# Patient Record
Sex: Male | Born: 1970
Health system: Southern US, Community
[De-identification: ages and names within clinical notes are randomized; demographics above are authoritative.]

## PROBLEM LIST (undated history)

## (undated) DIAGNOSIS — M1990 Unspecified osteoarthritis, unspecified site: Secondary | ICD-10-CM

## (undated) DIAGNOSIS — N319 Neuromuscular dysfunction of bladder, unspecified: Secondary | ICD-10-CM

## (undated) DIAGNOSIS — I10 Essential (primary) hypertension: Secondary | ICD-10-CM

## (undated) DIAGNOSIS — K592 Neurogenic bowel, not elsewhere classified: Secondary | ICD-10-CM

## (undated) DIAGNOSIS — G709 Myoneural disorder, unspecified: Secondary | ICD-10-CM

## (undated) HISTORY — DX: Neuromuscular dysfunction of bladder, unspecified: N31.9

## (undated) HISTORY — PX: WISDOM TOOTH EXTRACTION: SHX21

## (undated) HISTORY — PX: TONSILLECTOMY: SUR1361

## (undated) HISTORY — DX: Neurogenic bowel, not elsewhere classified: K59.2

---

## 2014-10-05 ENCOUNTER — Ambulatory Visit (INDEPENDENT_AMBULATORY_CARE_PROVIDER_SITE_OTHER): Payer: BLUE CROSS/BLUE SHIELD | Admitting: Family Medicine

## 2014-10-05 ENCOUNTER — Encounter: Payer: Self-pay | Admitting: Family Medicine

## 2014-10-05 VITALS — BP 174/110 | HR 76 | Ht 73.0 in | Wt 279.1 lb

## 2014-10-05 DIAGNOSIS — I10 Essential (primary) hypertension: Secondary | ICD-10-CM

## 2014-10-05 DIAGNOSIS — E669 Obesity, unspecified: Secondary | ICD-10-CM

## 2014-10-05 DIAGNOSIS — L918 Other hypertrophic disorders of the skin: Secondary | ICD-10-CM | POA: Diagnosis not present

## 2014-10-05 DIAGNOSIS — M5116 Intervertebral disc disorders with radiculopathy, lumbar region: Secondary | ICD-10-CM | POA: Diagnosis not present

## 2014-10-05 HISTORY — PX: OTHER SURGICAL HISTORY: SHX169

## 2014-10-05 MED ORDER — LISINOPRIL 10 MG PO TABS: 10.0000 mg | ORAL_TABLET | Freq: Every day | ORAL | Status: DC

## 2014-10-06 ENCOUNTER — Ambulatory Visit: Payer: Self-pay | Admitting: Family Medicine

## 2014-10-06 ENCOUNTER — Encounter: Payer: Self-pay | Admitting: Family Medicine

## 2014-10-06 LAB — COMPREHENSIVE METABOLIC PANEL
ALT: 27 IU/L (ref 0–44)
AST: 30 IU/L (ref 0–40)
Albumin/Globulin Ratio: 1.9 (ref 1.1–2.5)
Albumin: 4.4 g/dL (ref 3.5–5.5)
Alkaline Phosphatase: 46 IU/L (ref 39–117)
BUN/Creatinine Ratio: 13 (ref 9–20)
BUN: 13 mg/dL (ref 6–24)
Bilirubin Total: 0.7 mg/dL (ref 0.0–1.2)
CO2: 28 mmol/L (ref 18–29)
Calcium: 9.7 mg/dL (ref 8.7–10.2)
Chloride: 100 mmol/L (ref 97–108)
Creatinine, Ser: 0.98 mg/dL (ref 0.76–1.27)
GFR calc Af Amer: 109 mL/min/{1.73_m2} (ref >59)
GFR calc non Af Amer: 95 mL/min/{1.73_m2} (ref >59)
Globulin, Total: 2.3 g/dL (ref 1.5–4.5)
Glucose: 98 mg/dL (ref 65–99)
Potassium: 4 mmol/L (ref 3.5–5.2)
Sodium: 143 mmol/L (ref 134–144)
Total Protein: 6.7 g/dL (ref 6.0–8.5)

## 2014-10-06 LAB — CBC
Hematocrit: 41.2 % (ref 37.5–51.0)
Hemoglobin: 13.8 g/dL (ref 12.6–17.7)
MCH: 30.2 pg (ref 26.6–33.0)
MCHC: 33.5 g/dL (ref 31.5–35.7)
MCV: 90 fL (ref 79–97)
Platelets: 230 10*3/uL (ref 150–379)
RBC: 4.57 x10E6/uL (ref 4.14–5.80)
RDW: 13.7 % (ref 12.3–15.4)
WBC: 6.3 10*3/uL (ref 3.4–10.8)

## 2014-10-06 LAB — LIPID PANEL
Chol/HDL Ratio: 5 ratio units (ref 0.0–5.0)
Cholesterol, Total: 174 mg/dL (ref 100–199)
HDL: 35 mg/dL — ABNORMAL LOW (ref >39)
LDL Calculated: 101 mg/dL — ABNORMAL HIGH (ref 0–99)
Triglycerides: 189 mg/dL — ABNORMAL HIGH (ref 0–149)
VLDL Cholesterol Cal: 38 mg/dL (ref 5–40)

## 2014-10-06 LAB — TSH: TSH: 1.04 u[IU]/mL (ref 0.450–4.500)

## 2014-10-06 NOTE — Progress Notes (Signed)
Date:  10/05/2014   Name:  Andrew [name]   DOB:  [birth year]   MRN:  [id number]  PCP:  No primary care provider on file.    Chief Complaint: Hypertension and Back Pain   History of Present Illness:  This is a 44 y.o. male with LBP for 12 yrs, bulging discs at L3/4 and 4/5 seen on MRI 2006, currently with R leg pain thigh to ankle, worse past 6 months, worse with standing but sitting ok, uses exercise ball, PT helped in past, asks about seeing Dr. Lovell Sheehan at [facility]. Off lisinopril since May, BP well controlled on med previously. Has skin tag R inner elbow wants removed. Tetanus 2008. Weight up 30# past 3 months due to decreased exercise. Hx high chol on Crestor in past.  Review of Systems:  Review of Systems  Constitutional: Positive for activity change. Negative for fever, appetite change and fatigue.  HENT: Negative for ear pain and sore throat.   Eyes: Negative for pain.  Respiratory: Negative for shortness of breath.   Cardiovascular: Negative for chest pain and leg swelling.  Gastrointestinal: Negative for abdominal pain and rectal pain.  Endocrine: Negative for polyuria.  Genitourinary: Negative for flank pain and penile pain.  Musculoskeletal: Negative for back pain.  Skin: Negative for rash.  Neurological: Negative for syncope and headaches.  Hematological: Negative for adenopathy.  Psychiatric/Behavioral: Negative for confusion. The patient is not nervous/anxious.     Patient Active Problem List   Diagnosis Date Noted  . Hypertension 10/05/2014  . Obesity (BMI 30-39.9) 10/05/2014  . Lumbar disc disease with radiculopathy 10/05/2014    Prior to Admission medications   Medication Sig Start Date End Date Taking? Authorizing Provider  amoxicillin (AMOXIL) 500 MG capsule Take 1 capsule by mouth 3 (three) times daily. 10/02/14  Yes Historical Provider, MD  lisinopril (PRINIVIL,ZESTRIL) 10 MG tablet Take 1 tablet (10 mg total) by mouth daily. 10/05/14   Schuyler Amor, MD    No  Known Allergies  Past Surgical History  Procedure Laterality Date  . None  [id number]    History  Substance Use Topics  . Smoking status: Former Smoker    Quit date: 04/28/2002  . Smokeless tobacco: Not on file  . Alcohol Use: 0.0 oz/week    0 Standard drinks or equivalent per week    Family History  Problem Relation Age of Onset  . Hypertension Mother   . Cancer Brother     nonhodgkin lymphoma    Medication list has been reviewed and updated.  Physical Examination: BP 174/110 mmHg  Pulse 76  Ht  (1.854 m)  Wt 279 lb 1.6 oz (126.599 kg)  BMI 36.83 kg/m2  Physical Exam  Constitutional: He is oriented to person, place, and time. He appears well-developed and well-nourished.  HENT:  Head: Normocephalic and atraumatic.  Right Ear: External ear normal.  Left Ear: External ear normal.  Mouth/Throat: Oropharynx is clear and moist.  Eyes: Conjunctivae and EOM are normal. Pupils are equal, round, and reactive to light. Right eye exhibits no discharge. Left eye exhibits no discharge. No scleral icterus.  Neck: No thyromegaly present.  Cardiovascular: Normal rate, regular rhythm and normal heart sounds.   Pulmonary/Chest: Effort normal and breath sounds normal.  Abdominal: Soft. He exhibits no distension and no mass. There is no tenderness.  Musculoskeletal: He exhibits no edema.  Positive right SLR, some pain with bent knee flexion and hip rotation as well, gait normal  Lymphadenopathy:    He  has no cervical adenopathy.  Neurological: He is alert and oriented to person, place, and time. Coordination normal.  Skin: No rash noted.  1 cm skin tag inner aspect R elbow  Psychiatric: He has a normal mood and affect. His behavior is normal.    Assessment and Plan:  1. Essential hypertension Poor control off lisinopril, restart - Comprehensive Metabolic Panel (CMET) - CBC  2. Obesity (BMI 30-39.9) Weight loss/exercise discussed - TSH - Lipid Profile  3. Skin  tag After alcohol prep, skin tag removed with sterile scissors, minimal blood loss, bandaid applied  4. Lumbar disc disease with radiculopathy Refer PT as helped in past, consider Dr. Lovell SheehanJenkins referral if sxs worsen/persist - Ambulatory referral to Physical Therapy  Return in about 4 weeks (around 11/02/2014).  Dionne AnoWilliam M. Kingsley SpittlePlonk, Jr. MD [facility]  10/06/2014

## 2014-10-12 ENCOUNTER — Ambulatory Visit: Payer: BLUE CROSS/BLUE SHIELD | Attending: Family Medicine | Admitting: Physical Therapy

## 2014-10-12 DIAGNOSIS — M6281 Muscle weakness (generalized): Secondary | ICD-10-CM

## 2014-10-12 DIAGNOSIS — M2560 Stiffness of unspecified joint, not elsewhere classified: Secondary | ICD-10-CM | POA: Diagnosis not present

## 2014-10-12 DIAGNOSIS — M5441 Lumbago with sciatica, right side: Secondary | ICD-10-CM | POA: Insufficient documentation

## 2014-10-13 NOTE — Therapy (Signed)
[facility] [address] [place], [place], [address] Phone: [phone]   Fax:  [phone]  Physical Therapy Evaluation  Patient Details  Name: Andrew [name] MRN: [id number] Date of Birth: Jul 30, 1970 Referring Provider:  Schuyler Amor, MD  Encounter Date: 10/12/2014      PT End of Session - 10/13/14 1909    Visit Number 1   Number of Visits 8   Date for PT Re-Evaluation 11/09/14   PT Start Time 1603   PT Stop Time 1702   PT Time Calculation (min) 59 min   Activity Tolerance Patient tolerated treatment well;Patient limited by pain      No past medical history on file.  Past Surgical History  Procedure Laterality Date  . None  [id number]    There were no vitals filed for this visit.  Visit Diagnosis:  Right-sided low back pain with right-sided sciatica  Muscle weakness  Joint stiffness      Subjective Assessment - 10/13/14 1900    Subjective Pt. reports chronic LBP (R side>L) over past 12 years.  Pt. states he had an MRI in 2006 with a clinical impression of L3-4, L4-5 disc bulges.  Pt. reports persistent R LE pain which is worse in standing/ running/ increase physical activity.    Limitations Sitting;Lifting;Standing;Walking   Diagnostic tests MRI 10+ years ago   Patient Stated Goals Increase activity tolerance/ decrease pain/ return to gym based ex. program.     Currently in Pain? Yes   Pain Score 5    Pain Location Back   Pain Orientation Right;Lower            [facility] PT Assessment - 10/13/14 0001    Assessment   Medical Diagnosis Low back pain   Onset Date/Surgical Date 03/28/03   Next MD Visit 11/02/14      Objective: see HEP (handouts).  Manual tx.: Prone STM to B lumbar paraspinals with focus on R side.  Grade II-III PA mobs. (CPA/UPA)- as tolerated 2x15 sec. (R sided tenderness/ SI discomfort into piriformis).  Supine LE/lumbar generalized stretches 8 min.         PT Education - 10/12/14 1711    Education provided Yes   Person(s) Educated Patient   Methods Explanation;Demonstration;Handout   Comprehension Verbalized understanding;Returned demonstration             PT Long Term Goals - 10/13/14 1923    PT LONG TERM GOAL #1   Title Pt. I with HEP to increase core stability/ lumbar AROM to improve pain-free mobility/ return to gym based ex.    Time 4   Status New   PT LONG TERM GOAL #2   Title Pt. will decrease Oswestry to <30% to improve pain-free mobility.     Baseline 48% self-perceived severe disability   Time 4   Period Weeks   Status New   PT LONG TERM GOAL #3   Title Pt. will report decrease R LE radicular symptoms/ localized to lumbar region to improve pain-free mobility.     Baseline R LE radicular symptoms to R ankle   Time 4   Period Weeks   Status New   PT LONG TERM GOAL #4   Title Pt. able to tolerate 30 min. of gym based ex. with no increase c/o LBP or radicular symptoms.     Time 4   Period Weeks   Status New  Plan - 10/13/14 1912    Clinical Impression Statement Pt. is a 44 y/o male with >12 year h/o back pain with R LE radicular symptoms.  Pt. reports persistent back pain which is exacerbated with prolonged standing/walking/ increase physical activity.  Pt. presents with 50% limitation in standing lumbar flexion/ ext.  No benefit from repeated movement in standing.  (+) R SLR with B hamstring tightness noted.  Mild lumbar hypomobility with grade II PA mobs. with R lumbar paraspinal tenderness.  Good B hip capsule mobility (all planes).  Pt. reports increase pain with prolonged standing/walking at work and states he has a decrease in overall activity tolerance.  Pt. will benefit from skilled PT services to increase core stability/ pain-free lumbar AROM to improve return to exercise/ functional mobility.     Pt will benefit from skilled therapeutic intervention in order to improve on the following deficits Hypomobility;Obesity;Decreased  activity tolerance;Decreased strength;Pain;Difficulty walking;Decreased mobility;Decreased range of motion;Improper body mechanics;Postural dysfunction   Rehab Potential Fair   PT Frequency 2x / week   PT Duration 4 weeks   PT Treatment/Interventions Passive range of motion;Aquatic Therapy;Patient/family education;Dry needling;Moist Heat;Manual techniques;Therapeutic exercise;Electrical Stimulation;Cryotherapy;Gait training;Functional mobility training   PT Next Visit Plan review HEP/ manual tx. focus.    PT Home Exercise Plan see handouts   Recommended Other Services gym based ex.    Consulted and Agree with Plan of Care Patient         Problem List Patient Active Problem List   Diagnosis Date Noted  . Hypertension 10/05/2014  . Obesity (BMI 30-39.9) 10/05/2014  . Lumbar disc disease with radiculopathy 10/05/2014   Andrew McgeeMichael C Kristian [name], PT, DPT # [id number]   10/13/2014, 7:44 PM  [place] [facility] [address] [place], [place], [address] Phone: [phone]   Fax:  [phone]

## 2014-10-14 ENCOUNTER — Ambulatory Visit: Payer: BLUE CROSS/BLUE SHIELD | Admitting: Physical Therapy

## 2014-10-14 DIAGNOSIS — M5441 Lumbago with sciatica, right side: Secondary | ICD-10-CM

## 2014-10-14 DIAGNOSIS — M2560 Stiffness of unspecified joint, not elsewhere classified: Secondary | ICD-10-CM

## 2014-10-14 DIAGNOSIS — M6281 Muscle weakness (generalized): Secondary | ICD-10-CM

## 2014-10-15 NOTE — Therapy (Signed)
[place] [facility] [address] [place], [place], [address] Phone: [phone]   Fax:  [phone]  Physical Therapy Treatment  Patient Details  Name: Andrew [name] MRN: [id number] Date of Birth: 1970-04-19 Referring Provider:  Schuyler Amor, MD  Encounter Date: 10/14/2014      PT End of Session - 10/15/14 1809    Visit Number 2   Number of Visits 8   Date for PT Re-Evaluation 11/09/14   PT Start Time 1555   PT Stop Time 1648   PT Time Calculation (min) 53 min   Activity Tolerance Patient tolerated treatment well;Patient limited by pain   Behavior During Therapy [facility] for tasks assessed/performed      No past medical history on file.  Past Surgical History  Procedure Laterality Date  . None  [id number]    There were no vitals filed for this visit.  Visit Diagnosis:  Right-sided low back pain with right-sided sciatica  Muscle weakness  Joint stiffness      Subjective Assessment - 10/15/14 1807    Subjective Pt. reports initial soreness after PT initial evaluation but felt better the following day.  Pt. states STM/ manual tx has helped and had wife give a massage.     Limitations Sitting;Lifting;Standing;Walking   Diagnostic tests MRI 10+ years ago   Patient Stated Goals Increase activity tolerance/ decrease pain/ return to gym based ex. program.     Currently in Pain? Yes   Pain Score 4    Pain Location Back   Pain Orientation Right;Lower   Pain Type Chronic pain        OBJECTIVE:  There.ex.: Reviewed HEP.  TrA muscle contraction with instruction in supine position with B knee flexion.  Tactile/ verbal cuing for proper technique with pelvic tilts/ hip flexion/ bridging (pain tolerable range).  Manual:  Supine LE/ lumbar stretches with hamstring/ piriformis/ trunk rotn. Stretches 3x each.  Prone grade II-III PA mobs. T11-L5 2x15 sec. (as tolerated)- tenderness noted.  STM to lumbar paraspinals to increase muscle  extensibility.      Pt response for medical necessity: cuing for proper core muscle contraction with supine stabilization program.  (+) tenderness with palpation/ STM but able to tolerate mobs. To increase spinal mobility.           PT Long Term Goals - 10/13/14 1923    PT LONG TERM GOAL #1   Title Pt. I with HEP to increase core stability/ lumbar AROM to improve pain-free mobility/ return to gym based ex.    Time 4   Status New   PT LONG TERM GOAL #2   Title Pt. will decrease Oswestry to <30% to improve pain-free mobility.     Baseline 48% self-perceived severe disability   Time 4   Period Weeks   Status New   PT LONG TERM GOAL #3   Title Pt. will report decrease R LE radicular symptoms/ localized to lumbar region to improve pain-free mobility.     Baseline R LE radicular symptoms to R ankle   Time 4   Period Weeks   Status New   PT LONG TERM GOAL #4   Title Pt. able to tolerate 30 min. of gym based ex. with no increase c/o LBP or radicular symptoms.     Time 4   Period Weeks   Status New            Plan - 10/15/14 1810    Clinical Impression Statement Pt. requires  cuing to relax hip/hamstring musculature during stretches.  Several areas of point tenderness with palpation along R lumbar/ SI region during manual tx. Cuing required for core muscle contraction during ther.ex./ position changes.     Pt will benefit from skilled therapeutic intervention in order to improve on the following deficits Hypomobility;Obesity;Decreased activity tolerance;Decreased strength;Pain;Difficulty walking;Decreased mobility;Decreased range of motion;Improper body mechanics;Postural dysfunction   Rehab Potential Fair   PT Frequency 2x / week   PT Duration 4 weeks   PT Treatment/Interventions Passive range of motion;Aquatic Therapy;Patient/family education;Dry needling;Moist Heat;Manual techniques;Therapeutic exercise;Electrical Stimulation;Cryotherapy;Gait training;Functional mobility  training   PT Next Visit Plan review HEP/ manual tx. focus.    PT Home Exercise Plan see handouts   Recommended Other Services gym based ex.    Consulted and Agree with Plan of Care Patient        Problem List Patient Active Problem List   Diagnosis Date Noted  . Hypertension 10/05/2014  . Obesity (BMI 30-39.9) 10/05/2014  . Lumbar disc disease with radiculopathy 10/05/2014   Cammie Mcgee, PT, DPT # [id number]   10/15/2014, 6:21 PM  [place] [facility] [address] [place], [place], [address] Phone: [phone]   Fax:  [phone]

## 2014-10-19 ENCOUNTER — Ambulatory Visit: Payer: BLUE CROSS/BLUE SHIELD | Admitting: Physical Therapy

## 2014-10-19 DIAGNOSIS — M5441 Lumbago with sciatica, right side: Secondary | ICD-10-CM

## 2014-10-19 DIAGNOSIS — M6281 Muscle weakness (generalized): Secondary | ICD-10-CM

## 2014-10-19 DIAGNOSIS — M2560 Stiffness of unspecified joint, not elsewhere classified: Secondary | ICD-10-CM

## 2014-10-19 NOTE — Therapy (Signed)
[place] [facility] [address] [place], [place], [address] Phone: [phone]   Fax:  [phone]  Physical Therapy Treatment  Patient Details  Name: Andrew [name] MRN: [id number] Date of Birth: [birth year] Referring Provider:  Schuyler Amor, MD  Encounter Date: 10/19/2014      PT End of Session - 10/20/14 1819    Visit Number 3   Number of Visits 8   Date for PT Re-Evaluation 11/09/14   PT Start Time 1606   PT Stop Time 1701   PT Time Calculation (min) 55 min   Activity Tolerance Patient tolerated treatment well;Patient limited by pain   Behavior During Therapy [facility] for tasks assessed/performed      No past medical history on file.  Past Surgical History  Procedure Laterality Date  . None  [id number]    There were no vitals filed for this visit.  Visit Diagnosis:  Right-sided low back pain with right-sided sciatica  Muscle weakness  Joint stiffness      Subjective Assessment - 10/20/14 1255    Subjective Pt. c/o persistent back pain with fluctuations in symptoms depending on activities.  Pt. reports pain while in pool with kids this weekend and had difficulty trying to complete HEP/ stretches.  Pt. discussed with PT if a MRI or an appt. with a Neurosurgeon would benefit pt. at this time.  PT discussed the benefit of core strengthening and feels scheduleding an appt. with Dr. Lovell Sheehan (pts. preferred Neruosurgeon) may benefit pt. at this time secondary to chronicity of pain/ limited progress.     Limitations Sitting;Lifting;Standing;Walking   Diagnostic tests MRI 10+ years ago   Patient Stated Goals Increase activity tolerance/ decrease pain/ return to gym based ex. program.     Currently in Pain? Yes   Pain Score 5    Pain Location Back   Pain Orientation Right;Lower   Pain Relieving Factors sitting with immediately decrease back pain.        OBJECTIVE: There.ex.: TrA muscle contraction with instruction in supine position  with pelvic tilts/ hip flexion/ SLR/ partial bridging/ hip abd. 5x each. Tactile/ verbal cuing for proper technique (no progression of HEP at this time).  Seated ball ex./ supine lumbar ext. 5x (as tolerated). Manual: Supine LE/ lumbar stretches with hamstring/ piriformis/ trunk rotn. Stretches 3x each. Prone grade II-III PA mobs. T11-L5 2x15 sec. (as tolerated)- tenderness noted. STM to lumbar paraspinals to increase muscle extensibility.   Pt response for medical necessity: persistent pain t/o tx. Session and (+) tenderness with palpation/ STM but able to tolerate mobs. To increase spinal mobility/ pain mgmt.         PT Long Term Goals - 10/13/14 1923    PT LONG TERM GOAL #1   Title Pt. I with HEP to increase core stability/ lumbar AROM to improve pain-free mobility/ return to gym based ex.    Time 4   Status New   PT LONG TERM GOAL #2   Title Pt. will decrease Oswestry to <30% to improve pain-free mobility.     Baseline 48% self-perceived severe disability   Time 4   Period Weeks   Status New   PT LONG TERM GOAL #3   Title Pt. will report decrease R LE radicular symptoms/ localized to lumbar region to improve pain-free mobility.     Baseline R LE radicular symptoms to R ankle   Time 4   Period Weeks   Status New   PT LONG TERM GOAL #  4   Title Pt. able to tolerate 30 min. of gym based ex. with no increase c/o LBP or radicular symptoms.     Time 4   Period Weeks   Status New            Plan - 10/20/14 1819    Clinical Impression Statement Moderate c/o pain in lumbar region, esp. R SI region/ superior gluteal region.  Difficulty with position changes on mat table, esp. going from prone to sitting on edge of mat table.  LBP with decrease almost immediately with sitting in chair with back support.  (+) lumbar paraspinal tenderness.     Pt will benefit from skilled therapeutic intervention in order to improve on the following deficits  Hypomobility;Obesity;Decreased activity tolerance;Decreased strength;Pain;Difficulty walking;Decreased mobility;Decreased range of motion;Improper body mechanics;Postural dysfunction   Rehab Potential Fair   PT Frequency 2x / week   PT Duration 4 weeks   PT Treatment/Interventions Passive range of motion;Aquatic Therapy;Patient/family education;Dry needling;Moist Heat;Manual techniques;Therapeutic exercise;Electrical Stimulation;Cryotherapy;Gait training;Functional mobility training   PT Next Visit Plan review HEP/ manual tx. focus.  Discuss MRI/ Dr. Lovell Sheehan protocol for initial treatment.     PT Home Exercise Plan see handouts   Recommended Other Services gym based/ pool ex.    Consulted and Agree with Plan of Care Patient        Problem List Patient Active Problem List   Diagnosis Date Noted  . Hypertension 10/05/2014  . Obesity (BMI 30-39.9) 10/05/2014  . Lumbar disc disease with radiculopathy 10/05/2014   Cammie Mcgee, PT, DPT # [id number]   10/20/2014, 6:24 PM  [facility] [address] [place], [place], [address] Phone: [phone]   Fax:  [phone]

## 2014-10-21 ENCOUNTER — Ambulatory Visit: Payer: BLUE CROSS/BLUE SHIELD | Admitting: Physical Therapy

## 2014-10-21 DIAGNOSIS — M6281 Muscle weakness (generalized): Secondary | ICD-10-CM

## 2014-10-21 DIAGNOSIS — M5441 Lumbago with sciatica, right side: Secondary | ICD-10-CM

## 2014-10-21 DIAGNOSIS — M2560 Stiffness of unspecified joint, not elsewhere classified: Secondary | ICD-10-CM

## 2014-10-21 NOTE — Therapy (Signed)
[facility] [address] [place], [place], [address] Phone: [phone]   Fax:  [phone]  Physical Therapy Treatment  Patient Details  Name: Andrew [name] MRN: [id number] Date of Birth: 1971-03-22 Referring Provider:  Adline Potter, MD  Encounter Date: 10/21/2014      PT End of Session - 10/22/14 2053    Visit Number 4   Number of Visits 8   Date for PT Re-Evaluation 11/09/14   PT Start Time 3220   PT Stop Time 1700   PT Time Calculation (min) 54 min   Activity Tolerance Patient tolerated treatment well;Patient limited by pain   Behavior During Therapy [facility] for tasks assessed/performed      No past medical history on file.  Past Surgical History  Procedure Laterality Date  . None  [id number]    There were no vitals filed for this visit.  Visit Diagnosis:  Right-sided low back pain with right-sided sciatica  Muscle weakness  Joint stiffness      Subjective Assessment - 10/22/14 2040    Subjective Pt. continues to c/o persistent back pain with fluctuations in symptoms depending on activities.  Pt. able to decrease pain symptoms with sitting but is really wanting to return to more active lifestyle but unable due to back pain.  Pt. discussed with PT if a MRI or an appt. with a Neurosurgeon would benefit pt. at this time.  PT discussed the benefit of core strengthening and feels scheduleding an appt. with Dr. Arnoldo Morale (pts. preferred Neruosurgeon) may benefit pt. at this time secondary to chronicity of pain/ limited progress.     Limitations Sitting;Lifting;Standing;Walking   Diagnostic tests MRI 10+ years ago.  Pt. brought in MRI discs but computer unable to download.     Patient Stated Goals Increase activity tolerance/ decrease pain/ return to gym based ex. program.     Currently in Pain? Yes   Pain Score 5    Pain Location Back   Pain Orientation Right;Lower   Pain Type Chronic pain      OBJECTIVE: There.ex.: TrA  muscle contraction with quadruped positioning/ pelvic neutral/ alt.UE/LE 10x each.  Attempted prone planks on knees and side planks (pain limited).  Scifit L7 12 min.  Supine bolster bridging (difficulty with with R sided pain). Tactile/ verbal cuing for proper technique (no progression of HEP at this time). Seated ball ex./ supine lumbar ext. 5x (as tolerated). Manual: Supine LE/ lumbar stretches with hamstring/ piriformis/ trunk rotn. Stretches 3x each. Prone grade II-III PA mobs. T11-L5 2x15 sec. (as tolerated)- tenderness noted. STM to lumbar paraspinals to increase muscle extensibility.   Pt response for medical necessity: persistent pain t/o tx. Session and (+) tenderness with palpation/ STM but able to tolerate mobs. To increase spinal mobility/ pain mgmt.  Significant R piriformis tenderness/ tightness.         PT Long Term Goals - 10/22/14 2054    PT LONG TERM GOAL #1   Title Pt. I with HEP to increase core stability/ lumbar AROM to improve pain-free mobility/ return to gym based ex.    Time 4   Period Weeks   Status Not Met   PT LONG TERM GOAL #2   Title Pt. will decrease Oswestry to <30% to improve pain-free mobility.     Baseline 48% self-perceived severe disability   Time 4   Period Weeks   Status On-going   PT LONG TERM GOAL #3   Title Pt. will report decrease R  LE radicular symptoms/ localized to lumbar region to improve pain-free mobility.     Baseline R LE radicular symptoms to R ankle   Time 4   Period Weeks   Status Not Met   PT LONG TERM GOAL #4   Title Pt. able to tolerate 30 min. of gym based ex. with no increase c/o LBP or radicular symptoms.     Time 4   Period Weeks   Status Not Met      Problem List Patient Active Problem List   Diagnosis Date Noted  . Hypertension 10/05/2014  . Obesity (BMI 30-39.9) 10/05/2014  . Lumbar disc disease with radiculopathy 10/05/2014   Pura Spice, PT, DPT # [id number]   10/22/2014, 8:55 PM  [facility] [address] [place], [place], [address] Phone: [phone]   Fax:  [phone]

## 2014-10-26 ENCOUNTER — Encounter: Payer: BLUE CROSS/BLUE SHIELD | Admitting: Physical Therapy

## 2014-10-28 ENCOUNTER — Encounter: Payer: BLUE CROSS/BLUE SHIELD | Admitting: Physical Therapy

## 2014-11-02 ENCOUNTER — Other Ambulatory Visit: Payer: Self-pay

## 2014-11-02 DIAGNOSIS — M5116 Intervertebral disc disorders with radiculopathy, lumbar region: Secondary | ICD-10-CM

## 2014-11-05 ENCOUNTER — Other Ambulatory Visit: Payer: Self-pay

## 2014-11-05 ENCOUNTER — Other Ambulatory Visit: Payer: Self-pay | Admitting: Family Medicine

## 2014-11-05 DIAGNOSIS — M5416 Radiculopathy, lumbar region: Secondary | ICD-10-CM

## 2014-11-12 ENCOUNTER — Encounter: Payer: Self-pay | Admitting: Family Medicine

## 2014-11-12 ENCOUNTER — Ambulatory Visit (INDEPENDENT_AMBULATORY_CARE_PROVIDER_SITE_OTHER): Payer: BLUE CROSS/BLUE SHIELD | Admitting: Family Medicine

## 2014-11-12 VITALS — BP 154/88 | HR 72 | Ht 73.0 in | Wt 277.4 lb

## 2014-11-12 DIAGNOSIS — M5116 Intervertebral disc disorders with radiculopathy, lumbar region: Secondary | ICD-10-CM

## 2014-11-12 DIAGNOSIS — R361 Hematospermia: Secondary | ICD-10-CM

## 2014-11-12 DIAGNOSIS — I10 Essential (primary) hypertension: Secondary | ICD-10-CM

## 2014-11-12 DIAGNOSIS — N419 Inflammatory disease of prostate, unspecified: Secondary | ICD-10-CM

## 2014-11-12 DIAGNOSIS — E669 Obesity, unspecified: Secondary | ICD-10-CM

## 2014-11-12 MED ORDER — LISINOPRIL-HYDROCHLOROTHIAZIDE 10-12.5 MG PO TABS: 1.0000 | ORAL_TABLET | Freq: Every day | ORAL | Status: DC

## 2014-11-12 MED ORDER — SULFAMETHOXAZOLE-TRIMETHOPRIM 800-160 MG PO TABS: 1.0000 | ORAL_TABLET | Freq: Two times a day (BID) | ORAL | Status: DC

## 2014-11-12 NOTE — Progress Notes (Signed)
Date:  11/12/2014   Name:  Andrew [name]   DOB:  1970/12/02   MRN:  [id number]  PCP:  Schuyler Amor, MD    Chief Complaint: No chief complaint on file.   History of Present Illness:  This is a 44 y.o. male with blood streaks in semen past two weeks, denies pain, dysuria, hematuria, painful ejaculation, no similar sxs in past. Has appt with Dr. Lovell Sheehan 11/27/14, has appt with ortho 11/26/14 to discuss MRI, wonders if he can be seen sooner by ortho or needs to reschedule Dr. Lovell Sheehan. Saw PT for several sessions but no help. F/u HTN back on lisinopril, no se's noted.  Review of Systems:  Review of Systems  Constitutional: Negative for fever and fatigue.  Respiratory: Negative for cough and shortness of breath.   Cardiovascular: Negative for chest pain and leg swelling.  Genitourinary: Negative for dysuria, urgency, frequency, hematuria, flank pain, discharge, penile swelling, scrotal swelling, difficulty urinating, genital sores, penile pain and testicular pain.    Patient Active Problem List   Diagnosis Date Noted  . Hypertension 10/05/2014  . Obesity (BMI 30-39.9) 10/05/2014  . Lumbar disc disease with radiculopathy 10/05/2014    Prior to Admission medications   Medication Sig Start Date End Date Taking? Authorizing Provider  lisinopril-hydrochlorothiazide (PRINZIDE,ZESTORETIC) 10-12.5 MG per tablet Take 1 tablet by mouth daily. 11/12/14   Schuyler Amor, MD  sulfamethoxazole-trimethoprim (BACTRIM DS,SEPTRA DS) 800-160 MG per tablet Take 1 tablet by mouth 2 (two) times daily. 11/12/14   Schuyler Amor, MD    No Known Allergies  Past Surgical History  Procedure Laterality Date  . None  [id number]    Social History  Substance Use Topics  . Smoking status: Former Smoker    Quit date: 04/28/2002  . Smokeless tobacco: Not on file  . Alcohol Use: 0.0 oz/week    0 Standard drinks or equivalent per week    Family History  Problem Relation Age of Onset  . Hypertension Mother   . Cancer  Brother     nonhodgkin lymphoma    Medication list has been reviewed and updated.  Physical Examination: BP 154/88 mmHg  Pulse 72  Ht  (1.854 m)  Wt 277 lb 6.4 oz (125.828 kg)  BMI 36.61 kg/m2  Physical Exam  Constitutional: He appears well-developed and well-nourished.  Cardiovascular: Normal rate, regular rhythm and normal heart sounds.   Pulmonary/Chest: Effort normal and breath sounds normal.  Genitourinary: Rectum normal and penis normal.  Testes normal bilaterally, no hernias Prostate 1+ symmetric, mildly tender  Musculoskeletal: He exhibits no edema.  Neurological: He is alert.  Skin: Skin is warm and dry. No rash noted.  Psychiatric: He has a normal mood and affect. His behavior is normal.    Assessment and Plan:  1. Hematospermia Possible prostatitis, will rx with abxs, consider urology referral if sxs persist  2. Prostatitis, unspecified prostatitis type Bactrim DS bid x 2 wks  3. Lumbar disc disease with radiculopathy Discussed with nurse, will ask pt to keep ortho appt and reschedule NSGY appt for following week  4. Essential hypertension Improved but marginal control, change to lisinopril/HCTZ  5. Obesity (BMI 30-39.9) Weight down 2#, encouraged continued weight loss  Return in about 4 weeks (around 12/10/2014).  Dionne Ano. Kingsley Spittle MD [facility]  11/12/2014

## 2014-12-01 ENCOUNTER — Other Ambulatory Visit: Payer: Self-pay | Admitting: Orthopedic Surgery

## 2014-12-01 DIAGNOSIS — M4726 Other spondylosis with radiculopathy, lumbar region: Secondary | ICD-10-CM

## 2014-12-01 DIAGNOSIS — M5416 Radiculopathy, lumbar region: Secondary | ICD-10-CM

## 2014-12-09 ENCOUNTER — Ambulatory Visit
Admission: RE | Admit: 2014-12-09 | Discharge: 2014-12-09 | Disposition: A | Discharge status: Home / Self Care | Payer: BLUE CROSS/BLUE SHIELD | Source: Ambulatory Visit | Attending: Orthopedic Surgery | Admitting: Orthopedic Surgery

## 2014-12-09 DIAGNOSIS — M5416 Radiculopathy, lumbar region: Secondary | ICD-10-CM | POA: Diagnosis present

## 2014-12-09 DIAGNOSIS — M4726 Other spondylosis with radiculopathy, lumbar region: Secondary | ICD-10-CM | POA: Insufficient documentation

## 2014-12-09 DIAGNOSIS — M4806 Spinal stenosis, lumbar region: Secondary | ICD-10-CM | POA: Insufficient documentation

## 2014-12-14 ENCOUNTER — Other Ambulatory Visit: Payer: Self-pay | Admitting: Neurosurgery

## 2015-01-07 ENCOUNTER — Other Ambulatory Visit: Payer: Self-pay | Admitting: Family Medicine

## 2015-01-07 ENCOUNTER — Telehealth: Payer: Self-pay

## 2015-01-07 MED ORDER — LISINOPRIL 10 MG PO TABS: 10.0000 mg | ORAL_TABLET | Freq: Every day | ORAL | Status: DC

## 2015-01-07 NOTE — Telephone Encounter (Signed)
Sent to [facility] 

## 2015-01-07 NOTE — Telephone Encounter (Signed)
Ok refill lisinopril only (new rx sent)

## 2015-01-07 NOTE — Telephone Encounter (Signed)
I changed from lisinopril alone to lisinopril/HCTZ on 11/12/14. Please ask pharmacy to fill lisinopril/HCTZ prescription.

## 2015-01-11 NOTE — Pre-Procedure Instructions (Signed)
Andrew [name]  01/11/2015      [id number] [name], [place] - [address] [place] [address] Phone: [phone] Fax: [phone]    Your procedure is scheduled on Thurs, Oct 27 @ 7:30 AM  Report to [facility] Admitting at 5:30 AM  Call this number if you have problems the morning of surgery:  [phone]   Remember:  Do not eat food or drink liquids after midnight.  Take these medicines the morning of surgery with A SIP OF WATER Lyrica(Pregabalin)              No Goody's,BC's,Aleve,Aspirin,Ibuprofen,Fish Oil,or any Herbal Medications.   Do not wear jewelry.  Do not wear lotions, powders, or colognes.  You may wear deodorant.  Men may shave face and neck.  Do not bring valuables to the hospital.  [facility] is not responsible for any belongings or valuables.  Contacts, dentures or bridgework may not be worn into surgery.  Leave your suitcase in the car.  After surgery it may be brought to your room.  For patients admitted to the hospital, discharge time will be determined by your treatment team.  Patients discharged the day of surgery will not be allowed to drive home.    Special instructions:  [place] - Preparing for Surgery  Before surgery, you can play an important role.  Because skin is not sterile, your skin needs to be as free of germs as possible.  You can reduce the number of germs on you skin by washing with CHG (chlorahexidine gluconate) soap before surgery.  CHG is an antiseptic cleaner which kills germs and bonds with the skin to continue killing germs even after washing.  Please DO NOT use if you have an allergy to CHG or antibacterial soaps.  If your skin becomes reddened/irritated stop using the CHG and inform your nurse when you arrive at Short Stay.  Do not shave (including legs and underarms) for at least 48 hours prior to the first CHG shower.  You may shave your face.  Please follow these  instructions carefully:   1.  Shower with CHG Soap the night before surgery and the                                morning of Surgery.  2.  If you choose to wash your hair, wash your hair first as usual with your       normal shampoo.  3.  After you shampoo, rinse your hair and body thoroughly to remove the                      Shampoo.  4.  Use CHG as you would any other liquid soap.  You can apply chg directly       to the skin and wash gently with scrungie or a clean washcloth.  5.  Apply the CHG Soap to your body ONLY FROM THE NECK DOWN.        Do not use on open wounds or open sores.  Avoid contact with your eyes,       ears, mouth and genitals (private parts).  Wash genitals (private parts)       with your normal soap.  6.  Wash thoroughly, paying special attention to the area where your surgery        will be  performed.  7.  Thoroughly rinse your body with warm water from the neck down.  8.  DO NOT shower/wash with your normal soap after using and rinsing off       the CHG Soap.  9.  Pat yourself dry with a clean towel.            10.  Wear clean pajamas.            11.  Place clean sheets on your bed the night of your first shower and do not        sleep with pets.  Day of Surgery  Do not apply any lotions/deoderants the morning of surgery.  Please wear clean clothes to the hospital/surgery center.    Please read over the following fact sheets that you were given. Pain Booklet, Coughing and Deep Breathing, MRSA Information and Surgical Site Infection Prevention

## 2015-01-12 ENCOUNTER — Encounter (HOSPITAL_COMMUNITY)
Admission: RE | Admit: 2015-01-12 | Discharge: 2015-01-12 | Disposition: A | Discharge status: Home / Self Care | Payer: BLUE CROSS/BLUE SHIELD | Source: Ambulatory Visit | Attending: Neurosurgery | Admitting: Neurosurgery

## 2015-01-12 ENCOUNTER — Other Ambulatory Visit: Payer: Self-pay

## 2015-01-12 ENCOUNTER — Encounter (HOSPITAL_COMMUNITY): Payer: Self-pay

## 2015-01-12 DIAGNOSIS — Z01818 Encounter for other preprocedural examination: Secondary | ICD-10-CM | POA: Insufficient documentation

## 2015-01-12 DIAGNOSIS — Z01812 Encounter for preprocedural laboratory examination: Secondary | ICD-10-CM | POA: Diagnosis not present

## 2015-01-12 DIAGNOSIS — M4806 Spinal stenosis, lumbar region: Secondary | ICD-10-CM | POA: Insufficient documentation

## 2015-01-12 HISTORY — DX: Unspecified osteoarthritis, unspecified site: M19.90

## 2015-01-12 HISTORY — DX: Essential (primary) hypertension: I10

## 2015-01-12 HISTORY — DX: Myoneural disorder, unspecified: G70.9

## 2015-01-12 LAB — CBC
HCT: 44.4 % (ref 39.0–52.0)
Hemoglobin: 14.9 g/dL (ref 13.0–17.0)
MCH: 30 pg (ref 26.0–34.0)
MCHC: 33.6 g/dL (ref 30.0–36.0)
MCV: 89.5 fL (ref 78.0–100.0)
Platelets: 200 10*3/uL (ref 150–400)
RBC: 4.96 MIL/uL (ref 4.22–5.81)
RDW: 13.5 % (ref 11.5–15.5)
WBC: 6.6 10*3/uL (ref 4.0–10.5)

## 2015-01-12 LAB — BASIC METABOLIC PANEL
Anion gap: 7 (ref 5–15)
BUN: 8 mg/dL (ref 6–20)
CO2: 28 mmol/L (ref 22–32)
Calcium: 9.8 mg/dL (ref 8.9–10.3)
Chloride: 105 mmol/L (ref 101–111)
Creatinine, Ser: 0.96 mg/dL (ref 0.61–1.24)
GFR calc Af Amer: >60 mL/min (ref >60)
GFR calc non Af Amer: >60 mL/min (ref >60)
Glucose, Bld: 115 mg/dL — ABNORMAL HIGH (ref 65–99)
Potassium: 4.3 mmol/L (ref 3.5–5.1)
Sodium: 140 mmol/L (ref 135–145)

## 2015-01-12 LAB — SURGICAL PCR SCREEN
MRSA, PCR: NEGATIVE
Staphylococcus aureus: NEGATIVE

## 2015-01-12 NOTE — Progress Notes (Signed)
Pt. Denies all chest complaints, reports he is [facility] Pt. Is seeing MD on 10/21 for f/u. With ref: to BP med. Pt. Denies ever being referred to cardiology.

## 2015-01-12 NOTE — Progress Notes (Signed)
   01/12/15 0845  OBSTRUCTIVE SLEEP APNEA  Have you ever been diagnosed with sleep apnea through a sleep study? No  Do you snore loudly (loud enough to be heard through closed doors)?  0  Do you often feel tired, fatigued, or sleepy during the daytime (such as falling asleep during driving or talking to someone)? 0  Has anyone observed you stop breathing during your sleep? 0  Do you have, or are you being treated for high blood pressure? 1  BMI more than 35 kg/m2? 1  Age > 50 (1-yes) 0  Neck circumference greater than:Male 16 inches or larger, Male 17inches or larger? 1  Male Gender (Yes=1) 1  Obstructive Sleep Apnea Score 4

## 2015-01-15 ENCOUNTER — Encounter: Payer: Self-pay | Admitting: Family Medicine

## 2015-01-15 ENCOUNTER — Ambulatory Visit (INDEPENDENT_AMBULATORY_CARE_PROVIDER_SITE_OTHER): Payer: BLUE CROSS/BLUE SHIELD | Admitting: Family Medicine

## 2015-01-15 VITALS — BP 138/98 | HR 68 | Ht 73.0 in | Wt 278.6 lb

## 2015-01-15 DIAGNOSIS — M5116 Intervertebral disc disorders with radiculopathy, lumbar region: Secondary | ICD-10-CM | POA: Diagnosis not present

## 2015-01-15 DIAGNOSIS — R739 Hyperglycemia, unspecified: Secondary | ICD-10-CM | POA: Diagnosis not present

## 2015-01-15 DIAGNOSIS — E669 Obesity, unspecified: Secondary | ICD-10-CM

## 2015-01-15 DIAGNOSIS — I10 Essential (primary) hypertension: Secondary | ICD-10-CM | POA: Diagnosis not present

## 2015-01-15 DIAGNOSIS — T502X5A Adverse effect of carbonic-anhydrase inhibitors, benzothiadiazides and other diuretics, initial encounter: Secondary | ICD-10-CM

## 2015-01-15 MED ORDER — LISINOPRIL 20 MG PO TABS: 20.0000 mg | ORAL_TABLET | Freq: Every day | ORAL | Status: DC

## 2015-01-15 NOTE — Progress Notes (Signed)
Date:  01/15/2015   Name:  Andrew [name]   DOB:  [birth year]   MRN:  [id number]  PCP:  Schuyler AmorWilliam Izyk Marty, MD    Chief Complaint: Hypertension   History of Present Illness:  This is a 44 y.o. male with HTN, changed from lisinopril to lisinopril/HCTZ in August, had severe cramping so went back to lisinopril alone and cramping resolved. BP's well controlled at home in 130/80 range. Hematospermia resolved with Bactrim. Scheduled for laminectomy next week, preop blood work ok except glucose 115.  Review of Systems:  Review of Systems  Constitutional: Negative for fever and unexpected weight change.  Respiratory: Negative for cough and shortness of breath.   Cardiovascular: Negative for chest pain and leg swelling.  Gastrointestinal: Negative for abdominal pain.  Endocrine: Negative for polyuria.  Genitourinary: Negative for difficulty urinating.  Neurological: Negative for syncope and light-headedness.    Patient Active Problem List   Diagnosis Date Noted  . Hypertension 10/05/2014  . Obesity (BMI 30-39.9) 10/05/2014  . Lumbar disc disease with radiculopathy 10/05/2014    Prior to Admission medications   Medication Sig Start Date End Date Taking? Authorizing Provider  aspirin 81 MG tablet Take 81 mg by mouth daily.   Yes Historical Provider, MD  ibuprofen (ADVIL,MOTRIN) 200 MG tablet Take 400-800 mg by mouth every 6 (six) hours as needed.   Yes Historical Provider, MD  lisinopril (PRINIVIL,ZESTRIL) 20 MG tablet Take 1 tablet (20 mg total) by mouth daily. 01/15/15  Yes Schuyler AmorWilliam Guy Seese, MD  pregabalin (LYRICA) 75 MG capsule Take 1 capsule by mouth 2 (two) times daily. 11/26/14  Yes Historical Provider, MD    Allergies  Allergen Reactions  . Hydrochlorothiazide Other (See Comments)    Severe cramping    Past Surgical History  Procedure Laterality Date  . None  [id number]  . Wisdom tooth extraction      /w bone graft - also readying for implants   . Tonsillectomy      as a child      Social History  Substance Use Topics  . Smoking status: Former Smoker    Quit date: 04/28/2002  . Smokeless tobacco: None  . Alcohol Use: 0.0 oz/week    0 Standard drinks or equivalent per week     Comment: beer q day     Family History  Problem Relation Age of Onset  . Hypertension Mother   . Cancer Brother     nonhodgkin lymphoma    Medication list has been reviewed and updated.  Physical Examination: BP 138/98 mmHg  Pulse 68  Ht 6\' 1"  (1.854 m)  Wt 278 lb 9.6 oz (126.372 kg)  BMI 36.76 kg/m2  Physical Exam  Constitutional: He appears well-developed and well-nourished.  Cardiovascular: Normal rate, regular rhythm and normal heart sounds.   Pulmonary/Chest: Effort normal and breath sounds normal.  Musculoskeletal: He exhibits no edema.  Neurological: He is alert.  Skin: Skin is warm and dry.  Psychiatric: He has a normal mood and affect. His behavior is normal.  Nursing note and vitals reviewed.   Assessment and Plan:  1. Essential hypertension Intolerant HCTZ, increase lisinopril to 20 mg daily  2. Hydrochlorothiazide adverse reaction, initial encounter Cramping resolved off med  3. Lumbar disc disease with radiculopathy For laminectomy next week  4. Obesity (BMI 30-39.9) Exercise/weight loss after surgery  5. Hyperglycemia Check a1c next visit  Return in about 3 months (around 04/17/2015).  Dionne AnoWilliam M. Kingsley SpittlePlonk, Jr. MD [facility]  01/15/2015

## 2015-01-20 MED ORDER — DEXTROSE 5 % IV SOLN
3.0000 g | INTRAVENOUS | Status: AC
  Administered 2015-01-21: 3 g via INTRAVENOUS
  Filled 2015-01-20: qty 3000

## 2015-01-21 ENCOUNTER — Ambulatory Visit (HOSPITAL_COMMUNITY): Payer: BLUE CROSS/BLUE SHIELD | Admitting: Anesthesiology

## 2015-01-21 ENCOUNTER — Ambulatory Visit (HOSPITAL_COMMUNITY): Payer: BLUE CROSS/BLUE SHIELD

## 2015-01-21 ENCOUNTER — Encounter (HOSPITAL_COMMUNITY): Payer: Self-pay | Admitting: *Deleted

## 2015-01-21 ENCOUNTER — Encounter (HOSPITAL_COMMUNITY)
Admission: RE | Disposition: A | Discharge status: Home / Self Care | Payer: Self-pay | Source: Ambulatory Visit | Attending: Neurosurgery

## 2015-01-21 ENCOUNTER — Ambulatory Visit (HOSPITAL_COMMUNITY)
Admission: RE | Admit: 2015-01-21 | Discharge: 2015-01-21 | Disposition: A | Discharge status: Home / Self Care | Payer: BLUE CROSS/BLUE SHIELD | Source: Ambulatory Visit | Attending: Neurosurgery | Admitting: Neurosurgery

## 2015-01-21 DIAGNOSIS — M5116 Intervertebral disc disorders with radiculopathy, lumbar region: Secondary | ICD-10-CM | POA: Insufficient documentation

## 2015-01-21 DIAGNOSIS — M48062 Spinal stenosis, lumbar region with neurogenic claudication: Secondary | ICD-10-CM | POA: Diagnosis present

## 2015-01-21 DIAGNOSIS — I10 Essential (primary) hypertension: Secondary | ICD-10-CM | POA: Diagnosis not present

## 2015-01-21 DIAGNOSIS — Z87891 Personal history of nicotine dependence: Secondary | ICD-10-CM | POA: Diagnosis not present

## 2015-01-21 DIAGNOSIS — Z79899 Other long term (current) drug therapy: Secondary | ICD-10-CM | POA: Diagnosis not present

## 2015-01-21 DIAGNOSIS — Z7982 Long term (current) use of aspirin: Secondary | ICD-10-CM | POA: Diagnosis not present

## 2015-01-21 DIAGNOSIS — Z419 Encounter for procedure for purposes other than remedying health state, unspecified: Secondary | ICD-10-CM

## 2015-01-21 HISTORY — PX: LUMBAR LAMINECTOMY/DECOMPRESSION MICRODISCECTOMY: SHX5026

## 2015-01-21 SURGERY — LUMBAR LAMINECTOMY/DECOMPRESSION MICRODISCECTOMY 2 LEVELS
Anesthesia: General | Site: Spine Lumbar | Laterality: Bilateral

## 2015-01-21 MED ORDER — 0.9 % SODIUM CHLORIDE (POUR BTL) OPTIME
TOPICAL | Status: DC | PRN
  Administered 2015-01-21: 1000 mL

## 2015-01-21 MED ORDER — HEMOSTATIC AGENTS (NO CHARGE) OPTIME
TOPICAL | Status: DC | PRN
  Administered 2015-01-21: 1 via TOPICAL

## 2015-01-21 MED ORDER — OXYCODONE-ACETAMINOPHEN 10-325 MG PO TABS: 1.0000 | ORAL_TABLET | ORAL | Status: DC | PRN

## 2015-01-21 MED ORDER — ROCURONIUM BROMIDE 100 MG/10ML IV SOLN
INTRAVENOUS | Status: DC | PRN
  Administered 2015-01-21: 50 mg via INTRAVENOUS

## 2015-01-21 MED ORDER — PROPOFOL 10 MG/ML IV BOLUS
INTRAVENOUS | Status: DC | PRN
  Administered 2015-01-21: 200 mg via INTRAVENOUS

## 2015-01-21 MED ORDER — ACETAMINOPHEN 325 MG PO TABS: 650.0000 mg | ORAL_TABLET | ORAL | Status: DC | PRN

## 2015-01-21 MED ORDER — SUCCINYLCHOLINE CHLORIDE 20 MG/ML IJ SOLN
INTRAMUSCULAR | Status: AC
  Filled 2015-01-21: qty 1

## 2015-01-21 MED ORDER — LISINOPRIL 20 MG PO TABS
20.0000 mg | ORAL_TABLET | Freq: Every day | ORAL | Status: DC
  Administered 2015-01-21: 20 mg via ORAL
  Filled 2015-01-21: qty 1

## 2015-01-21 MED ORDER — MIDAZOLAM HCL 2 MG/2ML IJ SOLN
INTRAMUSCULAR | Status: AC
  Filled 2015-01-21: qty 4

## 2015-01-21 MED ORDER — THROMBIN 5000 UNITS EX SOLR
CUTANEOUS | Status: DC | PRN
Start: 2015-01-21 — End: 2015-01-21
  Administered 2015-01-21 (×2): 5000 [IU] via TOPICAL

## 2015-01-21 MED ORDER — GLYCOPYRROLATE 0.2 MG/ML IJ SOLN
INTRAMUSCULAR | Status: DC | PRN
  Administered 2015-01-21: 0.4 mg via INTRAVENOUS

## 2015-01-21 MED ORDER — CYCLOBENZAPRINE HCL 10 MG PO TABS: 10.0000 mg | ORAL_TABLET | Freq: Three times a day (TID) | ORAL | Status: DC | PRN

## 2015-01-21 MED ORDER — GLYCOPYRROLATE 0.2 MG/ML IJ SOLN
INTRAMUSCULAR | Status: AC
  Filled 2015-01-21: qty 3

## 2015-01-21 MED ORDER — MEPERIDINE HCL 25 MG/ML IJ SOLN: 6.2500 mg | INTRAMUSCULAR | Status: DC | PRN

## 2015-01-21 MED ORDER — EPHEDRINE SULFATE 50 MG/ML IJ SOLN
INTRAMUSCULAR | Status: AC
  Filled 2015-01-21: qty 1

## 2015-01-21 MED ORDER — ONDANSETRON HCL 4 MG/2ML IJ SOLN: 4.0000 mg | INTRAMUSCULAR | Status: DC | PRN

## 2015-01-21 MED ORDER — NEOSTIGMINE METHYLSULFATE 10 MG/10ML IV SOLN
INTRAVENOUS | Status: AC
  Filled 2015-01-21: qty 1

## 2015-01-21 MED ORDER — BUPIVACAINE-EPINEPHRINE (PF) 0.5% -1:200000 IJ SOLN
INTRAMUSCULAR | Status: DC | PRN
  Administered 2015-01-21: 10 mL

## 2015-01-21 MED ORDER — DEXAMETHASONE SODIUM PHOSPHATE 10 MG/ML IJ SOLN
INTRAMUSCULAR | Status: AC
  Filled 2015-01-21: qty 1

## 2015-01-21 MED ORDER — FENTANYL CITRATE (PF) 100 MCG/2ML IJ SOLN
INTRAMUSCULAR | Status: DC | PRN
  Administered 2015-01-21: 100 ug via INTRAVENOUS
  Administered 2015-01-21 (×3): 50 ug via INTRAVENOUS

## 2015-01-21 MED ORDER — BISACODYL 10 MG RE SUPP: 10.0000 mg | Freq: Every day | RECTAL | Status: DC | PRN

## 2015-01-21 MED ORDER — ACETAMINOPHEN 10 MG/ML IV SOLN
INTRAVENOUS | Status: AC
  Administered 2015-01-21: 1000 mg via INTRAVENOUS
  Filled 2015-01-21: qty 100

## 2015-01-21 MED ORDER — MENTHOL 3 MG MT LOZG: 1.0000 | LOZENGE | OROMUCOSAL | Status: DC | PRN

## 2015-01-21 MED ORDER — ONDANSETRON HCL 4 MG/2ML IJ SOLN
INTRAMUSCULAR | Status: DC | PRN
  Administered 2015-01-21: 4 mg via INTRAVENOUS

## 2015-01-21 MED ORDER — HYDROMORPHONE HCL 1 MG/ML IJ SOLN: 0.2500 mg | INTRAMUSCULAR | Status: DC | PRN

## 2015-01-21 MED ORDER — MIDAZOLAM HCL 5 MG/5ML IJ SOLN
INTRAMUSCULAR | Status: DC | PRN
  Administered 2015-01-21: 2 mg via INTRAVENOUS

## 2015-01-21 MED ORDER — CEFAZOLIN SODIUM-DEXTROSE 2-3 GM-% IV SOLR
2.0000 g | Freq: Three times a day (TID) | INTRAVENOUS | Status: DC
  Administered 2015-01-21: 2 g via INTRAVENOUS
  Filled 2015-01-21 (×2): qty 50

## 2015-01-21 MED ORDER — BUPIVACAINE LIPOSOME 1.3 % IJ SUSP
INTRAMUSCULAR | Status: DC | PRN
  Administered 2015-01-21: 20 mL

## 2015-01-21 MED ORDER — PHENYLEPHRINE 40 MCG/ML (10ML) SYRINGE FOR IV PUSH (FOR BLOOD PRESSURE SUPPORT)
PREFILLED_SYRINGE | INTRAVENOUS | Status: AC
  Filled 2015-01-21: qty 10

## 2015-01-21 MED ORDER — SODIUM CHLORIDE 0.9 % IJ SOLN
INTRAMUSCULAR | Status: AC
  Filled 2015-01-21: qty 10

## 2015-01-21 MED ORDER — PREGABALIN 75 MG PO CAPS: 75.0000 mg | ORAL_CAPSULE | Freq: Two times a day (BID) | ORAL | Status: DC

## 2015-01-21 MED ORDER — DOCUSATE SODIUM 100 MG PO CAPS
100.0000 mg | ORAL_CAPSULE | Freq: Two times a day (BID) | ORAL | Status: DC
  Administered 2015-01-21: 100 mg via ORAL
  Filled 2015-01-21: qty 1

## 2015-01-21 MED ORDER — PROMETHAZINE HCL 25 MG/ML IJ SOLN
6.2500 mg | INTRAMUSCULAR | Status: DC | PRN
Start: 2015-01-21 — End: 2015-01-21

## 2015-01-21 MED ORDER — ALUM & MAG HYDROXIDE-SIMETH 200-200-20 MG/5ML PO SUSP: 30.0000 mL | Freq: Four times a day (QID) | ORAL | Status: DC | PRN

## 2015-01-21 MED ORDER — ACETAMINOPHEN 650 MG RE SUPP: 650.0000 mg | RECTAL | Status: DC | PRN

## 2015-01-21 MED ORDER — LACTATED RINGERS IV SOLN: INTRAVENOUS | Status: DC

## 2015-01-21 MED ORDER — MORPHINE SULFATE (PF) 2 MG/ML IV SOLN: 1.0000 mg | INTRAVENOUS | Status: DC | PRN

## 2015-01-21 MED ORDER — BUPIVACAINE LIPOSOME 1.3 % IJ SUSP
20.0000 mL | INTRAMUSCULAR | Status: DC
  Filled 2015-01-21: qty 20

## 2015-01-21 MED ORDER — LACTATED RINGERS IV SOLN
INTRAVENOUS | Status: DC | PRN
  Administered 2015-01-21 (×3): via INTRAVENOUS

## 2015-01-21 MED ORDER — PROPOFOL 10 MG/ML IV BOLUS
INTRAVENOUS | Status: AC
  Filled 2015-01-21: qty 20

## 2015-01-21 MED ORDER — IBUPROFEN 200 MG PO TABS: 400.0000 mg | ORAL_TABLET | Freq: Four times a day (QID) | ORAL | Status: DC | PRN

## 2015-01-21 MED ORDER — HYDROCODONE-ACETAMINOPHEN 5-325 MG PO TABS
1.0000 | ORAL_TABLET | ORAL | Status: DC | PRN
  Administered 2015-01-21: 2 via ORAL
  Filled 2015-01-21: qty 2

## 2015-01-21 MED ORDER — GLYCOPYRROLATE 0.2 MG/ML IJ SOLN
INTRAMUSCULAR | Status: AC
  Filled 2015-01-21: qty 1

## 2015-01-21 MED ORDER — ONDANSETRON HCL 4 MG/2ML IJ SOLN
INTRAMUSCULAR | Status: AC
  Filled 2015-01-21: qty 2

## 2015-01-21 MED ORDER — FENTANYL CITRATE (PF) 250 MCG/5ML IJ SOLN
INTRAMUSCULAR | Status: AC
  Filled 2015-01-21: qty 5

## 2015-01-21 MED ORDER — DOCUSATE SODIUM 100 MG PO CAPS: 100.0000 mg | ORAL_CAPSULE | Freq: Two times a day (BID) | ORAL | Status: DC

## 2015-01-21 MED ORDER — PHENOL 1.4 % MT LIQD: 1.0000 | OROMUCOSAL | Status: DC | PRN

## 2015-01-21 MED ORDER — EPHEDRINE SULFATE 50 MG/ML IJ SOLN
INTRAMUSCULAR | Status: DC | PRN
  Administered 2015-01-21 (×2): 5 mg via INTRAVENOUS

## 2015-01-21 MED ORDER — OXYCODONE-ACETAMINOPHEN 5-325 MG PO TABS: 1.0000 | ORAL_TABLET | ORAL | Status: DC | PRN

## 2015-01-21 MED ORDER — ROCURONIUM BROMIDE 50 MG/5ML IV SOLN
INTRAVENOUS | Status: AC
  Filled 2015-01-21: qty 1

## 2015-01-21 MED ORDER — LIDOCAINE HCL (CARDIAC) 20 MG/ML IV SOLN
INTRAVENOUS | Status: AC
  Filled 2015-01-21: qty 5

## 2015-01-21 MED ORDER — DEXAMETHASONE SODIUM PHOSPHATE 10 MG/ML IJ SOLN
INTRAMUSCULAR | Status: DC | PRN
  Administered 2015-01-21: 10 mg via INTRAVENOUS

## 2015-01-21 MED ORDER — NEOSTIGMINE METHYLSULFATE 10 MG/10ML IV SOLN
INTRAVENOUS | Status: DC | PRN
  Administered 2015-01-21: 3 mg via INTRAVENOUS

## 2015-01-21 MED ORDER — BACITRACIN ZINC 500 UNIT/GM EX OINT
TOPICAL_OINTMENT | CUTANEOUS | Status: DC | PRN
  Administered 2015-01-21: 1 via TOPICAL

## 2015-01-21 MED ORDER — DIAZEPAM 5 MG PO TABS: 5.0000 mg | ORAL_TABLET | Freq: Four times a day (QID) | ORAL | Status: DC | PRN

## 2015-01-21 MED ORDER — SODIUM CHLORIDE 0.9 % IR SOLN
Status: DC | PRN
  Administered 2015-01-21: 09:00:00

## 2015-01-21 MED ORDER — LIDOCAINE HCL (CARDIAC) 20 MG/ML IV SOLN
INTRAVENOUS | Status: DC | PRN
  Administered 2015-01-21: 80 mg via INTRAVENOUS
  Administered 2015-01-21: 100 mg via INTRAVENOUS

## 2015-01-21 SURGICAL SUPPLY — 42 items
BAG DECANTER FOR FLEXI CONT (MISCELLANEOUS) ×2 IMPLANT
BENZOIN TINCTURE PRP APPL 2/3 (GAUZE/BANDAGES/DRESSINGS) ×2 IMPLANT
BLADE CLIPPER SURG (BLADE) IMPLANT
BRUSH SCRUB EZ PLAIN DRY (MISCELLANEOUS) ×2 IMPLANT
BUR MATCHSTICK NEURO 3.0 LAGG (BURR) ×2 IMPLANT
BUR PRECISION FLUTE 6.0 (BURR) ×2 IMPLANT
CANISTER SUCT 3000ML PPV (MISCELLANEOUS) ×2 IMPLANT
DRAPE LAPAROTOMY 100X72X124 (DRAPES) ×2 IMPLANT
DRAPE MICROSCOPE LEICA (MISCELLANEOUS) ×2 IMPLANT
DRAPE POUCH INSTRU U-SHP 10X18 (DRAPES) ×2 IMPLANT
DRAPE SURG 17X23 STRL (DRAPES) ×8 IMPLANT
ELECT BLADE 4.0 EZ CLEAN MEGAD (MISCELLANEOUS) ×2
ELECT REM PT RETURN 9FT ADLT (ELECTROSURGICAL) ×2
ELECTRODE BLDE 4.0 EZ CLN MEGD (MISCELLANEOUS) ×1 IMPLANT
ELECTRODE REM PT RTRN 9FT ADLT (ELECTROSURGICAL) ×1 IMPLANT
GAUZE SPONGE 4X4 12PLY STRL (GAUZE/BANDAGES/DRESSINGS) ×2 IMPLANT
GAUZE SPONGE 4X4 16PLY XRAY LF (GAUZE/BANDAGES/DRESSINGS) IMPLANT
GLOVE BIO SURGEON STRL SZ8 (GLOVE) ×2 IMPLANT
GLOVE BIO SURGEON STRL SZ8.5 (GLOVE) ×2 IMPLANT
GOWN STRL REUS W/ TWL LRG LVL3 (GOWN DISPOSABLE) ×1 IMPLANT
GOWN STRL REUS W/ TWL XL LVL3 (GOWN DISPOSABLE) ×2 IMPLANT
GOWN STRL REUS W/TWL 2XL LVL3 (GOWN DISPOSABLE) IMPLANT
GOWN STRL REUS W/TWL LRG LVL3 (GOWN DISPOSABLE) ×1
GOWN STRL REUS W/TWL XL LVL3 (GOWN DISPOSABLE) ×2
KIT BASIN OR (CUSTOM PROCEDURE TRAY) ×2 IMPLANT
KIT ROOM TURNOVER OR (KITS) ×2 IMPLANT
NEEDLE HYPO 21X1.5 SAFETY (NEEDLE) IMPLANT
NEEDLE HYPO 22GX1.5 SAFETY (NEEDLE) ×2 IMPLANT
NS IRRIG 1000ML POUR BTL (IV SOLUTION) ×2 IMPLANT
PACK LAMINECTOMY NEURO (CUSTOM PROCEDURE TRAY) ×2 IMPLANT
PAD ARMBOARD 7.5X6 YLW CONV (MISCELLANEOUS) ×6 IMPLANT
PATTIES SURGICAL .5 X1 (DISPOSABLE) IMPLANT
RUBBERBAND STERILE (MISCELLANEOUS) ×4 IMPLANT
SPONGE SURGIFOAM ABS GEL SZ50 (HEMOSTASIS) ×2 IMPLANT
STRIP CLOSURE SKIN 1/2X4 (GAUZE/BANDAGES/DRESSINGS) ×2 IMPLANT
SUT VIC AB 1 CT1 18XBRD ANBCTR (SUTURE) ×2 IMPLANT
SUT VIC AB 1 CT1 8-18 (SUTURE) ×2
SUT VIC AB 2-0 CP2 18 (SUTURE) ×4 IMPLANT
TAPE CLOTH SURG 4X10 WHT LF (GAUZE/BANDAGES/DRESSINGS) ×2 IMPLANT
TOWEL OR 17X24 6PK STRL BLUE (TOWEL DISPOSABLE) ×2 IMPLANT
TOWEL OR 17X26 10 PK STRL BLUE (TOWEL DISPOSABLE) ×2 IMPLANT
WATER STERILE IRR 1000ML POUR (IV SOLUTION) ×2 IMPLANT

## 2015-01-21 NOTE — Transfer of Care (Signed)
Immediate Anesthesia Transfer of Care Note  Patient: Andrew [name]  Procedure(s) Performed: Procedure(s) with comments: LUMBAR THREE-FOUR, LUMBAR FOUR-FIVE LUMBAR LAMINECTOMY/DECOMPRESSION MICRODISCECTOMY  (Bilateral) - L34 L45 laminectomies and foraminotomies  Patient Location: PACU  Anesthesia Type:General  Level of Consciousness: awake, oriented and patient cooperative  Airway & Oxygen Therapy: Patient Spontanous Breathing and Patient connected to nasal cannula oxygen  Post-op Assessment: Report given to RN and Post -op Vital signs reviewed and stable  Post vital signs: Reviewed and stable  Last Vitals:  Filed Vitals:   01/21/15 1115  BP: 122/71  Pulse: 62  Temp:   Resp: 4    Complications: No apparent anesthesia complications

## 2015-01-21 NOTE — Discharge Instructions (Signed)

## 2015-01-21 NOTE — Progress Notes (Signed)
Pt and wife given D/C instructions with Rx's, verbal understanding was provided. Pt's incision is clean and dry with no sign of infection. Pt's IV was removed prior to D/C. Pt D/C'd home via walking @ 1920 per MD order. Pt is stable @ D/C and has no other needs at this time. Rema FendtAshley Bretta Fees, RN

## 2015-01-21 NOTE — Evaluation (Signed)
Physical Therapy Evaluation & Discharge Patient Details Name: Andrew [name] MRN: [id number] DOB: Mar 13, 1971 Today's Date: 01/21/2015   History of Present Illness  Pt is a 44 y/o M s/p L3-4 and L4-5 laminotomies, foraminotomies and discectomy. PMH HTN and arthritis.  Clinical Impression  Pt was seen for PT evaluation and is independent with all functional mobility.  He has been educated on lumbar precautions and car transfers using teachback method with good recall and compliance throughout the session.  No further acute or follow-up PT is recommended at this time.    Follow Up Recommendations No PT follow up    Equipment Recommendations  None recommended by PT    Recommendations for Other Services       Precautions / Restrictions Precautions Precautions: Back Precaution Booklet Issued: Yes (comment) Precaution Comments: Educated pt on lumbar precautions using teachback method with recall 3/3. Required Braces or Orthoses: Other Brace/Splint (none yet but according to nurse lumbar brace will be given) Restrictions Weight Bearing Restrictions: No      Mobility  Bed Mobility Overal bed mobility: Modified Independent             General bed mobility comments: Bed rail and increased time; VCs to maintain back precautions  Transfers Overall transfer level: Independent                  Ambulation/Gait Ambulation/Gait assistance: Independent Ambulation Distance (Feet): 200 Feet Assistive device: None Gait Pattern/deviations: Step-through pattern;Decreased stride length Gait velocity: slower   General Gait Details: Some operative site guarding noted  Stairs Stairs: Yes Stairs assistance: Modified independent (Device/Increase time) Stair Management: One rail Left Number of Stairs: 12    Wheelchair Mobility    Modified Rankin (Stroke Patients Only)       Balance Overall balance assessment: No apparent balance deficits (not formally assessed)                                           Pertinent Vitals/Pain Pain Assessment: Faces Faces Pain Scale: Hurts little more Pain Location: operative site Pain Descriptors / Indicators: Aching;Operative site guarding Pain Intervention(s): Monitored during session    Home Living Family/patient expects to be discharged to:: Private residence Living Arrangements: Spouse/significant other Available Help at Discharge: Family;Available PRN/intermittently Type of Home: House Home Access: Stairs to enter Entrance Stairs-Rails: Right Entrance Stairs-Number of Steps: 3 Home Layout: Two level Home Equipment: None      Prior Function Level of Independence: Independent               Hand Dominance        Extremity/Trunk Assessment   Upper Extremity Assessment: Overall WFL for tasks assessed           Lower Extremity Assessment: Overall WFL for tasks assessed         Communication   Communication: No difficulties  Cognition Arousal/Alertness: Awake/alert Behavior During Therapy: WFL for tasks assessed/performed Overall Cognitive Status: Within Functional Limits for tasks assessed                      General Comments General comments (skin integrity, edema, etc.): Pt educated on back precautions and performing car transfers as well as activity at home.    Exercises        Assessment/Plan    PT Assessment Patent does not need any further PT services  PT  Diagnosis Acute pain   PT Problem List    PT Treatment Interventions     PT Goals (Current goals can be found in the Care Plan section)      Frequency     Barriers to discharge        Co-evaluation               End of Session Equipment Utilized During Treatment: Gait belt Activity Tolerance: Patient tolerated treatment well Patient left: in bed;with call bell/phone within reach;with family/visitor present Nurse Communication: Mobility status         Time: 1345-1400 PT Time  Calculation (min) (ACUTE ONLY): 15 min   Charges:   PT Evaluation $Initial PT Evaluation Tier I: 1 Procedure     PT G Codes:   PT G-Codes **NOT FOR INPATIENT CLASS** Functional Assessment Tool Used: clinical judgement Functional Limitation: Mobility: Walking and moving around Mobility: Walking and Moving Around Current Status ([id number](G8978): 0 percent impaired, limited or restricted Mobility: Walking and Moving Around Goal Status (U0454(G8979): 0 percent impaired, limited or restricted Mobility: Walking and Moving Around Discharge Status [phone](G8980): 0 percent impaired, limited or restricted    Arnoldo MoraleAmanda Kayson Bullis 01/21/2015, 3:05 PM Arnoldo MoraleAmanda Marieelena Bartko, SPT

## 2015-01-21 NOTE — Op Note (Signed)
Brief history: The patient is a 44 year old white male who has complained of back, buttock, and leg pain consistent with neurogenic claudication. He has failed medical management and was worked up with a lumbar MRI. This demonstrated multilevel degenerative changes with significant stenosis at L3-4 and L4-5. I discussed situation with the patient. He has weighed the risks, benefits, and alternative surgery and decided proceed with an L3-4 and L4-5 laminotomies, foraminotomies and discectomy.  Preoperative diagnosis: L3-4 and L4-5 spinal stenosis, herniated disc, lumbago, lumbar radiculopathy, neurogenic claudication  Postoperative diagnosis: The same  Procedure: Bilateral L3-4 and L4-5 laminotomies/foraminotomies to decompress the bilateral L4 and L5 nerve roots and left L3-4 discectomy Intervertebral discectomy using micro-dissection  Surgeon: Dr. Delma OfficerJeff Reka Wist  Asst.: Barbaraann BarthelKyle Cabell  Anesthesia: Gen. endotracheal  Estimated blood loss: 100 mL  Drains: None  Complications: None  Description of procedure: The patient was brought to the operating room by the anesthesia team. General endotracheal anesthesia was induced. The patient was turned to the prone position on the Wilson frame. The patient's lumbosacral region was then prepared with Betadine scrub and Betadine solution. Sterile drapes were applied.  I then injected the area to be incised with Marcaine with epinephrine solution. I then used a scalpel to make a linear midline incision over the L3-4 and L4-5 intervertebral disc space. I then used electrocautery to perform a bilateral sided subperiosteal dissection exposing the spinous process and lamina of L3, L4 and L5. We obtained intraoperative radiograph to confirm our location. I then inserted the [facility] ret[facility] for exposure.  We then brought the operative microscope into the field. Under its magnification and illumination we completed the microdissection. I used a high-speed drill  to perform bilateral laminotomies at L3-4 and L4-5. I then used a Kerrison punches to widen the laminotomy and removed the ligamentum flavum at L3-4 and L4-5 bilaterally. We then used microdissection to free up the thecal sac and the bilateral L4 and L5 nerve root from the epidural tissue. I then used a Kerrison punch to perform a foraminotomy at about the bilateral L4 and L5 nerve root. We then using the nerve root retractor to gently retract the thecal sac and the left L4 nerve root medially. This exposed the intervertebral disc. We identified the ruptured disc and remove it with the pituitary forceps.  I then palpated along the ventral surface of the thecal sac and along exit route of the bilateral L4 and L5 nerve root and noted that the neural structures were well decompressed. This completed the decompression.  We then obtained hemostasis using bipolar electrocautery. We irrigated the wound out with bacitracin solution. We then removed the retractor. We then reapproximated the patient's thoracolumbar fascia with interrupted #1 Vicryl suture. We then reapproximated the patient's subcutaneous tissue with interrupted 2-0 Vicryl suture. We then reapproximated patient's skin with Steri-Strips and benzoin. The was then coated with bacitracin ointment. The drapes were removed. The patient was subsequently returned to the supine position where they were extubated by the anesthesia team. The patient was then transported to the postanesthesia care unit in stable condition. All sponge instrument and needle counts were reportedly correct at the end of this case.

## 2015-01-21 NOTE — Anesthesia Procedure Notes (Signed)
Procedure Name: Intubation Date/Time: 01/21/2015 7:37 AM Performed by: Lovie CholOCK, Ilana Prezioso K Pre-anesthesia Checklist: Patient identified, Emergency Drugs available, Suction available, Patient being monitored and Timeout performed Patient Re-evaluated:Patient Re-evaluated prior to inductionOxygen Delivery Method: Circle system utilized Preoxygenation: Pre-oxygenation with 100% oxygen Intubation Type: IV induction Ventilation: Mask ventilation without difficulty and Oral airway inserted - appropriate to patient size Laryngoscope Size: Miller and 3 Grade View: Grade II Tube type: Oral Tube size: 7.5 mm Number of attempts: 1 Airway Equipment and Method: Stylet Placement Confirmation: ETT inserted through vocal cords under direct vision,  positive ETCO2,  CO2 detector and breath sounds checked- equal and bilateral Secured at: 23 cm Tube secured with: Tape Dental Injury: Teeth and Oropharynx as per pre-operative assessment  Comments: Soft bite block utilized.

## 2015-01-21 NOTE — Anesthesia Postprocedure Evaluation (Signed)
Anesthesia Post Note  Patient: Andrew [name] [name]  Procedure(s) Performed: Procedure(s) (LRB): LUMBAR THREE-FOUR, LUMBAR FOUR-FIVE LUMBAR LAMINECTOMY/DECOMPRESSION MICRODISCECTOMY  (Bilateral)  Anesthesia type: General  Patient location: PACU  Post pain: Pain level controlled  Post assessment: Post-op Vital signs reviewed  Last Vitals: BP 122/49 mmHg  Pulse 65  Temp(Src) 36.5 C (Oral)  Resp 20  SpO2 100%  Post vital signs: Reviewed  Level of consciousness: sedated  Complications: No apparent anesthesia complications

## 2015-01-21 NOTE — H&P (Signed)
Subjective: The patient is a 44 year old male who has complained of back, buttock, and leg pain consistent with neurogenic claudication. He has failed medical management and was worked up with a lumbar MRI. This demonstrates spinal stenosis at L3-4 and L4-5. I discussed the situation with the patient and his wife. I have reviewed his MRI with him. We discussed the various treatment options including surgery. He has decided to proceed with an L3-4 and L4-5 laminectomy.   Past Medical History  Diagnosis Date  . Hypertension   . Arthritis   . Neuromuscular disorder [facility](HCC)     lumbar spinal stenosis      Past Surgical History  Procedure Laterality Date  . None  [id number]  . Wisdom tooth extraction      /w bone graft - also readying for implants   . Tonsillectomy      as a child     Allergies  Allergen Reactions  . Hydrochlorothiazide Other (See Comments)    Severe cramping    Social History  Substance Use Topics  . Smoking status: Former Smoker    Quit date: 04/28/2002  . Smokeless tobacco: Not on file  . Alcohol Use: 0.0 oz/week    0 Standard drinks or equivalent per week     Comment: beer q day     Family History  Problem Relation Age of Onset  . Hypertension Mother   . Cancer Brother     nonhodgkin lymphoma   Prior to Admission medications   Medication Sig Start Date End Date Taking? Authorizing Provider  aspirin 81 MG tablet Take 81 mg by mouth daily.   Yes Historical Provider, MD  ibuprofen (ADVIL,MOTRIN) 200 MG tablet Take 400-800 mg by mouth every 6 (six) hours as needed.   Yes Historical Provider, MD  lisinopril (PRINIVIL,ZESTRIL) 20 MG tablet Take 1 tablet (20 mg total) by mouth daily. 01/15/15  Yes Schuyler AmorWilliam Plonk, MD  pregabalin (LYRICA) 75 MG capsule Take 1 capsule by mouth 2 (two) times daily. 11/26/14  Yes Historical Provider, MD     Review of Systems  Positive ROS: As above  All other systems have been reviewed and were otherwise negative with the exception of  those mentioned in the HPI and as above.  Objective: Vital signs in last 24 hours: Temp:  [97.5 F (36.4 C)] 97.5 F (36.4 C) (10/27 47820607) Pulse Rate:  [72] 72 (10/27 0607) Resp:  [18] 18 (10/27 0607) BP: (142)/(86) 142/86 mmHg (10/27 0607) SpO2:  [97 %] 97 % (10/27 0607)  General Appearance: Alert, cooperative, no distress, Head: Normocephalic, without obvious abnormality, atraumatic Eyes: PERRL, conjunctiva/corneas clear, EOM's intact,    Ears: Normal  Throat: Normal  Neck: Supple, symmetrical, trachea midline, no adenopathy; thyroid: No enlargement/tenderness/nodules; no carotid bruit or JVD Back: Symmetric, no curvature, ROM normal, no CVA tenderness Lungs: Clear to auscultation bilaterally, respirations unlabored Heart: Regular rate and rhythm, no murmur, rub or gallop Abdomen: Soft, non-tender,, no masses, no organomegaly Extremities: Extremities normal, atraumatic, no cyanosis or edema Pulses: 2+ and symmetric all extremities Skin: Skin color, texture, turgor normal, no rashes or lesions  NEUROLOGIC:   Mental status: alert and oriented, no aphasia, good attention span, Fund of knowledge/ memory ok Motor Exam - grossly normal Sensory Exam - grossly normal Reflexes:  Coordination - grossly normal Gait - grossly normal Balance - grossly normal Cranial Nerves: I: smell Not tested  II: visual acuity  OS: Normal  OD: Normal   II: visual fields Full to confrontation  II: pupils Equal, round, reactive to light  III,VII: ptosis None  III,IV,VI: extraocular muscles  Full ROM  V: mastication Normal  V: facial light touch sensation  Normal  V,VII: corneal reflex  Present  VII: facial muscle function - upper  Normal  VII: facial muscle function - lower Normal  VIII: hearing Not tested  IX: soft palate elevation  Normal  IX,X: gag reflex Present  XI: trapezius strength  5/5  XI: sternocleidomastoid strength 5/5  XI: neck flexion strength  5/5  XII: tongue strength   Normal    Data Review Lab Results  Component Value Date   WBC 6.6 01/12/2015   HGB 14.9 01/12/2015   HCT 44.4 01/12/2015   MCV 89.5 01/12/2015   PLT 200 01/12/2015   Lab Results  Component Value Date   NA 140 01/12/2015   K 4.3 01/12/2015   CL 105 01/12/2015   CO2 28 01/12/2015   BUN 8 01/12/2015   CREATININE 0.96 01/12/2015   GLUCOSE 115* 01/12/2015   No results found for: INR, PROTIME  Assessment/Plan: L3-4, L4-5 and L5-S1 spinal stenosis, lumbago, lumbar radiculopathy, neurogenic claudication: I have discussed the situation with the patient. I have reviewed his MRI scan with him and pointed out the abnormalities. We have discussed the various treatment options including surgery. I have described the surgical treatment option of an L3-4 and L4-5 laminectomy. I have shown him surgical models. We have discussed the risks, benefits, alternatives, and likelihood of achieving our goals with surgery. I have answered all the patient's questions. He has decided to proceed with surgery.   Maxen Rowland D 01/21/2015 7:21 AM

## 2015-01-21 NOTE — Progress Notes (Signed)
Called Dr.Germeroth for sign out-okay to go to room

## 2015-01-21 NOTE — Discharge Summary (Signed)
  Physician Discharge Summary  Patient ID: Andrew [name] Barbe MRN: [id number] DOB/AGE: January 06, 1971 44 y.o.  Admit date: 01/21/2015 Discharge date: 01/21/2015  Admission Diagnoses: L3-4 and L4-5 spinal stenosis, herniated disc, lumbago, lumbar radiculopathy, neurogenic claudication  Discharge Diagnoses: The same Active Problems:   Spinal stenosis of lumbar region with neurogenic claudication   Discharged Condition: good  Hospital Course: I performed a bilateral L3-4 and L4-5 laminotomies foraminotomies with a L3-4 discectomy on the patient on 01/21/2015. The surgery went well.  The patient's postoperative course was unremarkable. On the evening of surgery the patient requested discharge to home. He was given oral and written discharge instructions. All his questions were answered.  Consults: None Significant Diagnostic Studies: None Treatments: Bilateral L3-4 and L4-5 laminotomies foraminotomies using microdissection Discharge Exam: Blood pressure 139/79, pulse 101, temperature 98.1 F (36.7 C), temperature source Oral, resp. rate 18, SpO2 95 %. The patient is alert and pleasant. His strength is grossly normal his lower extremities. He looks well.  Disposition: Home     Medication List    TAKE these medications        aspirin 81 MG tablet  Take 81 mg by mouth daily.     cyclobenzaprine 10 MG tablet  Commonly known as:  FLEXERIL  Take 1 tablet (10 mg total) by mouth 3 (three) times daily as needed for muscle spasms.     docusate sodium 100 MG capsule  Commonly known as:  COLACE  Take 1 capsule (100 mg total) by mouth 2 (two) times daily.     ibuprofen 200 MG tablet  Commonly known as:  ADVIL,MOTRIN  Take 400-800 mg by mouth every 6 (six) hours as needed.     lisinopril 20 MG tablet  Commonly known as:  PRINIVIL,ZESTRIL  Take 1 tablet (20 mg total) by mouth daily.     oxyCODONE-acetaminophen 10-325 MG tablet  Commonly known as:  PERCOCET  Take 1 tablet by mouth every 4  (four) hours as needed for pain.     pregabalin 75 MG capsule  Commonly known as:  LYRICA  Take 1 capsule by mouth 2 (two) times daily.         SignedCristi Loron: Everly Rubalcava D 01/21/2015, 6:36 PM

## 2015-01-21 NOTE — Anesthesia Preprocedure Evaluation (Addendum)
Anesthesia Evaluation  Patient identified by MRN, date of birth, ID band Patient awake    Reviewed: Allergy & Precautions, NPO status , Patient's Chart, lab work & pertinent test results  Airway Mallampati: II  TM Distance: >3 FB Neck ROM: Full    Dental no notable dental hx. (+) Teeth Intact, Dental Advisory Given,    Pulmonary neg pulmonary ROS, former smoker,    Pulmonary exam normal breath sounds clear to auscultation       Cardiovascular hypertension, Pt. on medications Normal cardiovascular exam Rhythm:Regular Rate:Normal     Neuro/Psych negative neurological ROS  negative psych ROS   GI/Hepatic negative GI ROS, Neg liver ROS,   Endo/Other  negative endocrine ROS  Renal/GU negative Renal ROS     Musculoskeletal  (+) Arthritis ,   Abdominal (+) + obese,   Peds  Hematology negative hematology ROS (+)   Anesthesia Other Findings   Reproductive/Obstetrics negative OB ROS                           Anesthesia Physical Anesthesia Plan  ASA: II  Anesthesia Plan: General   Post-op Pain Management:    Induction: Intravenous  Airway Management Planned: Oral ETT  Additional Equipment:   Intra-op Plan:   Post-operative Plan: Extubation in OR  Informed Consent: I have reviewed the patients History and Physical, chart, labs and discussed the procedure including the risks, benefits and alternatives for the proposed anesthesia with the patient or authorized representative who has indicated his/her understanding and acceptance.   Dental advisory given  Plan Discussed with: CRNA  Anesthesia Plan Comments:         Anesthesia Quick Evaluation

## 2015-01-22 ENCOUNTER — Encounter (HOSPITAL_COMMUNITY): Payer: Self-pay | Admitting: Neurosurgery

## 2015-07-13 ENCOUNTER — Encounter: Payer: Self-pay | Admitting: Family Medicine

## 2015-07-13 ENCOUNTER — Ambulatory Visit (INDEPENDENT_AMBULATORY_CARE_PROVIDER_SITE_OTHER): Payer: 59 | Admitting: Family Medicine

## 2015-07-13 VITALS — BP 124/88 | HR 72 | Ht 73.0 in | Wt 266.0 lb

## 2015-07-13 DIAGNOSIS — E538 Deficiency of other specified B group vitamins: Secondary | ICD-10-CM

## 2015-07-13 DIAGNOSIS — E559 Vitamin D deficiency, unspecified: Secondary | ICD-10-CM

## 2015-07-13 DIAGNOSIS — I10 Essential (primary) hypertension: Secondary | ICD-10-CM

## 2015-07-13 DIAGNOSIS — E669 Obesity, unspecified: Secondary | ICD-10-CM

## 2015-07-13 DIAGNOSIS — M5116 Intervertebral disc disorders with radiculopathy, lumbar region: Secondary | ICD-10-CM

## 2015-07-13 DIAGNOSIS — R739 Hyperglycemia, unspecified: Secondary | ICD-10-CM

## 2015-07-13 DIAGNOSIS — R202 Paresthesia of skin: Secondary | ICD-10-CM

## 2015-07-13 NOTE — Progress Notes (Signed)
Date:  07/13/2015   Name:  Andrew [name]   DOB:  Jan 04, 1971   MRN:  [id number]  PCP:  Schuyler AmorWilliam Taylormarie Register, MD    Chief Complaint: Follow-up   History of Present Illness:  This is a 45 y.o. male who is six months s/p successful laminectomy for spinal stenosis. Has been exercising more and has lost 12#. Last month he developed dizziness while doing hot yoga and decreased his lisinopril to 10 mg daily. Has another episode dizziness last week, wonders if can stop med altogether. Otherwise feels well, still taking asa daily. Last blood work in October showed hyperglycemia. Has noted some intermittent tingling of R hand while sleeping lately.  Review of Systems:  Review of Systems  Constitutional: Negative for fever and fatigue.  Respiratory: Negative for cough and shortness of breath.   Cardiovascular: Negative for chest pain and leg swelling.  Endocrine: Negative for polyuria.  Genitourinary: Negative for difficulty urinating.  Neurological: Negative for syncope.    Patient Active Problem List   Diagnosis Date Noted  . Spinal stenosis of lumbar region with neurogenic claudication 01/21/2015  . Hypertension 10/05/2014  . Obesity (BMI 30-39.9) 10/05/2014  . Lumbar disc disease with radiculopathy 10/05/2014    Prior to Admission medications   Medication Sig Start Date End Date Taking? Authorizing Provider  aspirin 81 MG tablet Take 81 mg by mouth daily.   Yes Historical Provider, MD    Allergies  Allergen Reactions  . Hydrochlorothiazide Other (See Comments)    Severe cramping    Past Surgical History  Procedure Laterality Date  . None  [id number]  . Wisdom tooth extraction      /w bone graft - also readying for implants   . Tonsillectomy      as a child   . Lumbar laminectomy/decompression microdiscectomy Bilateral 01/21/2015    Procedure: LUMBAR THREE-FOUR, LUMBAR FOUR-FIVE LUMBAR LAMINECTOMY/DECOMPRESSION MICRODISCECTOMY ;  Surgeon: Tressie StalkerJeffrey Jenkins, MD;  Location: [facility];   Service: Neurosurgery;  Laterality: Bilateral;  L34 L45 laminectomies and foraminotomies  . Back surgery  12/26/2014    laminectomy L3-4 and L4-5    Social History  Substance Use Topics  . Smoking status: Former Smoker    Quit date: 04/28/2002  . Smokeless tobacco: None  . Alcohol Use: 0.0 oz/week    0 Standard drinks or equivalent per week     Comment: beer q day     Family History  Problem Relation Age of Onset  . Hypertension Mother   . Cancer Brother     nonhodgkin lymphoma    Medication list has been reviewed and updated.  Physical Examination: BP 124/88 mmHg  Pulse 72  Ht 6\' 1"  (1.854 m)  Wt 266 lb (120.657 kg)  BMI 35.10 kg/m2  Physical Exam  Constitutional: He appears well-developed and well-nourished.  Cardiovascular: Normal rate, regular rhythm and normal heart sounds.   Pulmonary/Chest: Effort normal and breath sounds normal.  Musculoskeletal: He exhibits no edema.  R hand unremarkable  Neurological: He is alert.  Skin: Skin is warm.  Psychiatric: He has a normal mood and affect. His behavior is normal.  Nursing note and vitals reviewed.   Assessment and Plan:  1. Essential hypertension Well controlled, ok to stop lisinopril, consider cardiac w/u if exertional dizziness persists - Comprehensive Metabolic Panel (CMET) - CBC  2. Lumbar disc disease with radiculopathy S/p successful laminectomy  3. Obesity (BMI 30-39.9) Weight down 12#, continue regular exercise - Lipid Profile - Vitamin D (25 hydroxy) -  TSH  4. Right hand paresthesia Likely benign and positional - B12  5. Hyperglycemia - HgB A1c  6. Med review Unclear need for asa, consider d/c next visit  Return in about 4 weeks (around 08/10/2015).  Dionne Ano. Kingsley Spittle MD [facility]  07/13/2015

## 2015-07-14 DIAGNOSIS — E559 Vitamin D deficiency, unspecified: Secondary | ICD-10-CM | POA: Insufficient documentation

## 2015-07-14 DIAGNOSIS — E538 Deficiency of other specified B group vitamins: Secondary | ICD-10-CM | POA: Insufficient documentation

## 2015-07-14 LAB — LIPID PANEL
Chol/HDL Ratio: 4.8 ratio units (ref 0.0–5.0)
Cholesterol, Total: 210 mg/dL — ABNORMAL HIGH (ref 100–199)
HDL: 44 mg/dL (ref >39)
LDL Calculated: 117 mg/dL — ABNORMAL HIGH (ref 0–99)
Triglycerides: 245 mg/dL — ABNORMAL HIGH (ref 0–149)
VLDL Cholesterol Cal: 49 mg/dL — ABNORMAL HIGH (ref 5–40)

## 2015-07-14 LAB — HEMOGLOBIN A1C
Est. average glucose Bld gHb Est-mCnc: 128 mg/dL
Hgb A1c MFr Bld: 6.1 % — ABNORMAL HIGH (ref 4.8–5.6)

## 2015-07-14 LAB — CBC
Hematocrit: 44.8 % (ref 37.5–51.0)
Hemoglobin: 14.8 g/dL (ref 12.6–17.7)
MCH: 29.6 pg (ref 26.6–33.0)
MCHC: 33 g/dL (ref 31.5–35.7)
MCV: 90 fL (ref 79–97)
Platelets: 265 10*3/uL (ref 150–379)
RBC: 5 x10E6/uL (ref 4.14–5.80)
RDW: 14.6 % (ref 12.3–15.4)
WBC: 9.2 10*3/uL (ref 3.4–10.8)

## 2015-07-14 LAB — COMPREHENSIVE METABOLIC PANEL
ALT: 27 IU/L (ref 0–44)
AST: 30 IU/L (ref 0–40)
Albumin/Globulin Ratio: 1.6 (ref 1.2–2.2)
Albumin: 4.6 g/dL (ref 3.5–5.5)
Alkaline Phosphatase: 53 IU/L (ref 39–117)
BUN/Creatinine Ratio: 12 (ref 9–20)
BUN: 12 mg/dL (ref 6–24)
Bilirubin Total: 0.7 mg/dL (ref 0.0–1.2)
CO2: 25 mmol/L (ref 18–29)
Calcium: 9.5 mg/dL (ref 8.7–10.2)
Chloride: 95 mmol/L — ABNORMAL LOW (ref 96–106)
Creatinine, Ser: 1 mg/dL (ref 0.76–1.27)
GFR calc Af Amer: 106 mL/min/{1.73_m2} (ref >59)
GFR calc non Af Amer: 92 mL/min/{1.73_m2} (ref >59)
Globulin, Total: 2.9 g/dL (ref 1.5–4.5)
Glucose: 84 mg/dL (ref 65–99)
Potassium: 4 mmol/L (ref 3.5–5.2)
Sodium: 140 mmol/L (ref 134–144)
Total Protein: 7.5 g/dL (ref 6.0–8.5)

## 2015-07-14 LAB — VITAMIN B12: Vitamin B-12: 282 pg/mL (ref 211–946)

## 2015-07-14 LAB — VITAMIN D 25 HYDROXY (VIT D DEFICIENCY, FRACTURES): Vit D, 25-Hydroxy: 25.2 ng/mL — ABNORMAL LOW (ref 30.0–100.0)

## 2015-07-14 LAB — TSH: TSH: 2.93 u[IU]/mL (ref 0.450–4.500)

## 2015-07-14 MED ORDER — VITAMIN B-12 1000 MCG PO TABS: 1000.0000 ug | ORAL_TABLET | Freq: Every day | ORAL | Status: DC

## 2015-07-14 MED ORDER — VITAMIN D3 25 MCG (1000 UT) PO CAPS: 1.0000 | ORAL_CAPSULE | Freq: Every day | ORAL | Status: DC

## 2015-07-14 NOTE — Addendum Note (Signed)
Addended by: Schuyler AmorPLONK, Tabathia Knoche on: 07/14/2015 09:12 AM   Modules accepted: Orders

## 2015-08-18 ENCOUNTER — Ambulatory Visit: Payer: 59 | Admitting: Family Medicine

## 2015-08-19 ENCOUNTER — Ambulatory Visit (INDEPENDENT_AMBULATORY_CARE_PROVIDER_SITE_OTHER): Payer: 59 | Admitting: Family Medicine

## 2015-08-19 ENCOUNTER — Encounter: Payer: Self-pay | Admitting: Family Medicine

## 2015-08-19 VITALS — BP 128/84 | HR 58 | Resp 16 | Ht 73.0 in | Wt 254.0 lb

## 2015-08-19 DIAGNOSIS — E559 Vitamin D deficiency, unspecified: Secondary | ICD-10-CM | POA: Diagnosis not present

## 2015-08-19 DIAGNOSIS — R42 Dizziness and giddiness: Secondary | ICD-10-CM

## 2015-08-19 DIAGNOSIS — R7303 Prediabetes: Secondary | ICD-10-CM | POA: Diagnosis not present

## 2015-08-19 DIAGNOSIS — E538 Deficiency of other specified B group vitamins: Secondary | ICD-10-CM

## 2015-08-19 DIAGNOSIS — E669 Obesity, unspecified: Secondary | ICD-10-CM

## 2015-08-19 MED ORDER — VITAMIN D3 25 MCG (1000 UT) PO CAPS: 1.0000 | ORAL_CAPSULE | Freq: Every day | ORAL | Status: DC

## 2015-08-19 NOTE — Progress Notes (Signed)
Date:  08/19/2015   Name:  Andrew [name]   DOB:  1970/11/29   MRN:  [id number]  PCP:  Schuyler AmorWilliam Marvene Strohm, MD    Chief Complaint: Hypertension   History of Present Illness:  This is a 45 y.o. male seen in one month f/u. Postural dizziness has resolved off lisinopril. Weight down 12#. R hand paresthesias have also resolved. Blood work last visit showed prediabetes and vit B12/D deficits, taking supplements for both.  Review of Systems:  Review of Systems  Constitutional: Negative for fever and fatigue.  Respiratory: Negative for cough and shortness of breath.   Cardiovascular: Negative for chest pain and leg swelling.  Endocrine: Negative for polyuria.  Genitourinary: Negative for difficulty urinating.  Neurological: Negative for syncope and light-headedness.    Patient Active Problem List   Diagnosis Date Noted  . Vitamin D deficiency 07/14/2015  . B12 deficiency 07/14/2015  . Hypertension 10/05/2014  . Obesity (BMI 30-39.9) 10/05/2014  . Lumbar disc disease with radiculopathy 10/05/2014    Prior to Admission medications   Medication Sig Start Date End Date Taking? Authorizing Provider  vitamin B-12 (CYANOCOBALAMIN) 1000 MCG tablet Take 1 tablet (1,000 mcg total) by mouth daily. 07/14/15  Yes Schuyler AmorWilliam Ladamien Rammel, MD    Allergies  Allergen Reactions  . Hydrochlorothiazide Other (See Comments)    Severe cramping    Past Surgical History  Procedure Laterality Date  . None  [id number]  . Wisdom tooth extraction      /w bone graft - also readying for implants   . Tonsillectomy      as a child   . Lumbar laminectomy/decompression microdiscectomy Bilateral 01/21/2015    Procedure: LUMBAR THREE-FOUR, LUMBAR FOUR-FIVE LUMBAR LAMINECTOMY/DECOMPRESSION MICRODISCECTOMY ;  Surgeon: Tressie StalkerJeffrey Jenkins, MD;  Location: [facility];  Service: Neurosurgery;  Laterality: Bilateral;  L34 L45 laminectomies and foraminotomies  . Back surgery  12/26/2014    laminectomy L3-4 and L4-5    Social History   Substance Use Topics  . Smoking status: Former Smoker    Quit date: 04/28/2002  . Smokeless tobacco: None  . Alcohol Use: 0.0 oz/week    0 Standard drinks or equivalent per week     Comment: beer q day     Family History  Problem Relation Age of Onset  . Hypertension Mother   . Cancer Brother     nonhodgkin lymphoma    Medication list has been reviewed and updated.  Physical Examination: BP 128/84 mmHg  Pulse 58  Resp 16  Ht 6\' 1"  (1.854 m)  Wt 254 lb (115.214 kg)  BMI 33.52 kg/m2  SpO2 100%  Physical Exam  Constitutional: He appears well-developed and well-nourished.  Cardiovascular: Normal rate, regular rhythm and normal heart sounds.   Pulmonary/Chest: Effort normal and breath sounds normal.  Musculoskeletal: He exhibits no edema.  Neurological: He is alert.  Skin: Skin is warm and dry.  Psychiatric: He has a normal mood and affect. His behavior is normal.  Nursing note and vitals reviewed.   Assessment and Plan:  1. Postural dizziness Resolved off lisinopril  2. B12 deficiency On supplement - B12  3. Vitamin D deficiency On supplement - Vitamin D (25 hydroxy)  4. Prediabetes Dx/px discussed, consider a1c next visit  5. Obesity (BMI 30-39.9) Encouraged continued exercise/weight loss  Return in about 6 months (around 02/19/2016).  Dionne AnoWilliam M. Kingsley SpittlePlonk, Jr. MD [facility]  08/19/2015

## 2015-08-20 LAB — VITAMIN D 25 HYDROXY (VIT D DEFICIENCY, FRACTURES): Vit D, 25-Hydroxy: 32.5 ng/mL (ref 30.0–100.0)

## 2015-08-20 LAB — VITAMIN B12: Vitamin B-12: 325 pg/mL (ref 211–946)

## 2016-02-23 ENCOUNTER — Encounter: Payer: Self-pay | Admitting: Internal Medicine

## 2016-02-23 ENCOUNTER — Ambulatory Visit (INDEPENDENT_AMBULATORY_CARE_PROVIDER_SITE_OTHER): Payer: 59 | Admitting: Internal Medicine

## 2016-02-23 VITALS — BP 132/80 | HR 68 | Resp 16 | Ht 73.0 in | Wt 245.0 lb

## 2016-02-23 DIAGNOSIS — E559 Vitamin D deficiency, unspecified: Secondary | ICD-10-CM | POA: Diagnosis not present

## 2016-02-23 DIAGNOSIS — R7303 Prediabetes: Secondary | ICD-10-CM | POA: Diagnosis not present

## 2016-02-23 DIAGNOSIS — Z0130 Encounter for examination of blood pressure without abnormal findings: Secondary | ICD-10-CM

## 2016-02-23 DIAGNOSIS — E538 Deficiency of other specified B group vitamins: Secondary | ICD-10-CM | POA: Diagnosis not present

## 2016-02-23 DIAGNOSIS — E782 Mixed hyperlipidemia: Secondary | ICD-10-CM | POA: Diagnosis not present

## 2016-02-23 NOTE — Progress Notes (Signed)
Date:  02/23/2016   Name:  Andrew [name]   DOB:  [birth year]   MRN:  [id number]   Chief Complaint: Hypertension Hypertension  The current episode started more than 1 year ago. The problem has been resolved since onset. The problem is controlled. Pertinent negatives include no chest pain, headaches, peripheral edema or shortness of breath. Risk factors for coronary artery disease include family history. Past treatments include ACE inhibitors (but was able to stop 6 mo ago after weight loss.).  Pre-diabetes - continues to work on diet and weight loss. Has lost another 9 lbs since last visit.  Overall 35 lbs since last year.  He feels well with no complaints.  Lab Results  Component Value Date   HGBA1C 6.1 (H) 07/13/2015   Vit D/B12 def - supplemented briefly with follow up levels normal.  Now on a multi-vitamin only.  Review of Systems  Constitutional: Negative for chills, fatigue and fever.  Respiratory: Negative for chest tightness and shortness of breath.   Cardiovascular: Negative for chest pain and leg swelling.  Gastrointestinal: Negative for abdominal pain.  Musculoskeletal: Negative for back pain (resolved after surgery).  Neurological: Negative for dizziness and headaches.    Patient Active Problem List   Diagnosis Date Noted  . Prediabetes 08/19/2015  . Vitamin D deficiency 07/14/2015  . B12 deficiency 07/14/2015  . Obesity (BMI 30-39.9) 10/05/2014  . Lumbar disc disease with radiculopathy 10/05/2014    Prior to Admission medications   Medication Sig Start Date End Date Taking? Authorizing Provider  Cholecalciferol (VITAMIN D3) 1000 units CAPS Take 1 capsule (1,000 Units total) by mouth daily. 08/19/15  Yes Schuyler AmorWilliam Plonk, MD  vitamin B-12 (CYANOCOBALAMIN) 1000 MCG tablet Take 1 tablet (1,000 mcg total) by mouth daily. 07/14/15  Yes Schuyler AmorWilliam Plonk, MD    Allergies  Allergen Reactions  . Hydrochlorothiazide Other (See Comments)    Severe cramping    Past Surgical  History:  Procedure Laterality Date  . BACK SURGERY  12/26/2014   laminectomy L3-4 and L4-5  . LUMBAR LAMINECTOMY/DECOMPRESSION MICRODISCECTOMY Bilateral 01/21/2015   Procedure: LUMBAR THREE-FOUR, LUMBAR FOUR-FIVE LUMBAR LAMINECTOMY/DECOMPRESSION MICRODISCECTOMY ;  Surgeon: Tressie StalkerJeffrey Jenkins, MD;  Location: [facility];  Service: Neurosurgery;  Laterality: Bilateral;  L34 L45 laminectomies and foraminotomies  . none  [id number]  . TONSILLECTOMY     as a child   . WISDOM TOOTH EXTRACTION     /w bone graft - also readying for implants     Social History  Substance Use Topics  . Smoking status: Former Smoker    Quit date: 04/28/2002  . Smokeless tobacco: Never Used  . Alcohol use 0.0 oz/week     Comment: beer q day      Medication list has been reviewed and updated.   Physical Exam  Constitutional: He is oriented to person, place, and time. He appears well-developed. No distress.  HENT:  Head: Normocephalic and atraumatic.  Neck: Normal range of motion. Neck supple. No thyromegaly present.  Cardiovascular: Normal rate, regular rhythm and normal heart sounds.   Pulmonary/Chest: Effort normal and breath sounds normal. No respiratory distress. He has no wheezes.  Musculoskeletal: He exhibits no edema or tenderness.  Lymphadenopathy:    He has no cervical adenopathy.  Neurological: He is alert and oriented to person, place, and time.  Skin: Skin is warm and dry. No rash noted.  Psychiatric: He has a normal mood and affect. His behavior is normal. Thought content normal.  Nursing note and vitals  reviewed.   BP 132/80   Pulse 68   Resp 16   Ht 6\' 1"  (1.854 m)   Wt 245 lb (111.1 kg)   SpO2 98%   BMI 32.32 kg/m   Assessment and Plan: 1. Prediabetes Continue diet and exercise/weight loss - Hemoglobin A1c  2. Examination of blood pressure Doing well off of medication Encouraged pt to monitor periodically at home  3. B12 deficiency On multi-vit  4. Vitamin D deficiency On  multi-vit  5. Hyperlipidemia, mixed Borderline - recheck at next visit/CPX   Bari EdwardLaura Shante Archambeault, MD [facility]  02/23/2016

## 2016-02-24 LAB — HEMOGLOBIN A1C
Est. average glucose Bld gHb Est-mCnc: 111 mg/dL
Hgb A1c MFr Bld: 5.5 % (ref 4.8–5.6)

## 2016-05-10 ENCOUNTER — Ambulatory Visit (INDEPENDENT_AMBULATORY_CARE_PROVIDER_SITE_OTHER): Payer: 59 | Admitting: Internal Medicine

## 2016-05-10 ENCOUNTER — Encounter: Payer: Self-pay | Admitting: Internal Medicine

## 2016-05-10 VITALS — BP 150/88 | HR 60 | Temp 97.6°F | Ht 73.0 in | Wt 241.0 lb

## 2016-05-10 DIAGNOSIS — M533 Sacrococcygeal disorders, not elsewhere classified: Secondary | ICD-10-CM | POA: Diagnosis not present

## 2016-05-10 DIAGNOSIS — M778 Other enthesopathies, not elsewhere classified: Secondary | ICD-10-CM

## 2016-05-10 MED ORDER — PREDNISONE 10 MG PO TABS: ORAL_TABLET | ORAL | 0 refills | Status: DC

## 2016-05-10 NOTE — Patient Instructions (Signed)
Ice back of wrist 15 minutes 2-3 times per day  Massage area of tenderness several times a day

## 2016-05-10 NOTE — Progress Notes (Signed)
Date:  05/10/2016   Name:  Andrew [name]   DOB:  [birth year]   MRN:  [id number]   Chief Complaint: Wrist Pain (Rt wrist) and Back Pain ("Felt like pulled muscle." Pain radiates down hamstring. Pt was doing yoga when injured.) Wrist Pain   The pain is present in the right wrist. This is a new problem. The current episode started more than 1 month ago. There has been no history of extremity trauma. The problem occurs constantly. The problem has been unchanged. The quality of the pain is described as aching. The pain is moderate. Associated symptoms include an inability to bear weight. Pertinent negatives include no fever, numbness, stiffness or tingling.  Back Pain  This is a new problem. The problem occurs intermittently. The pain is present in the sacro-iliac. The pain radiates to the right thigh. The pain is mild. Pertinent negatives include no chest pain, fever, numbness or tingling. Risk factors: injured in yoga practice. He has tried analgesics and heat for the symptoms. The treatment provided moderate relief.      Review of Systems  Constitutional: Negative for chills and fever.  Respiratory: Negative for chest tightness and shortness of breath.   Cardiovascular: Negative for chest pain.  Genitourinary: Negative for flank pain.  Musculoskeletal: Positive for arthralgias (right wrist), back pain and joint swelling. Negative for stiffness.  Skin: Negative for color change and rash.  Neurological: Negative for tingling and numbness.    Patient Active Problem List   Diagnosis Date Noted  . Prediabetes 08/19/2015  . Vitamin D deficiency 07/14/2015  . B12 deficiency 07/14/2015  . Obesity (BMI 30-39.9) 10/05/2014  . Lumbar disc disease with radiculopathy 10/05/2014    Prior to Admission medications   Medication Sig Start Date End Date Taking? Authorizing Provider  Multiple Vitamins-Minerals (MULTIVITAMIN ADULT PO) Take by mouth.    Historical Provider, MD    Allergies  Allergen  Reactions  . Hydrochlorothiazide Other (See Comments)    Severe cramping    Past Surgical History:  Procedure Laterality Date  . BACK SURGERY  12/26/2014   laminectomy L3-4 and L4-5  . LUMBAR LAMINECTOMY/DECOMPRESSION MICRODISCECTOMY Bilateral 01/21/2015   Procedure: LUMBAR THREE-FOUR, LUMBAR FOUR-FIVE LUMBAR LAMINECTOMY/DECOMPRESSION MICRODISCECTOMY ;  Surgeon: Tressie Stalker, MD;  Location: [facility];  Service: Neurosurgery;  Laterality: Bilateral;  L34 L45 laminectomies and foraminotomies  . none  [id number]  . TONSILLECTOMY     as a child   . WISDOM TOOTH EXTRACTION     /w bone graft - also readying for implants     Social History  Substance Use Topics  . Smoking status: Former Smoker    Quit date: 04/28/2002  . Smokeless tobacco: Never Used  . Alcohol use 0.0 oz/week     Comment: beer q day      Medication list has been reviewed and updated.   Physical Exam  Constitutional: He is oriented to person, place, and time. He appears well-developed. No distress.  HENT:  Head: Normocephalic and atraumatic.  Pulmonary/Chest: Effort normal. No respiratory distress.  Musculoskeletal: Normal range of motion.       Arms: Tender over right SI joint  Neurological: He is alert and oriented to person, place, and time. He has normal strength and normal reflexes. No sensory deficit.  Skin: Skin is warm and dry. No rash noted.  Psychiatric: He has a normal mood and affect. His behavior is normal. Thought content normal.  Nursing note and vitals reviewed.   BP (!) 150/88  Pulse 60   Temp 97.6 F (36.4 C)   Ht 6\' 1"  (1.854 m)   Wt 241 lb (109.3 kg)   SpO2 98%   BMI 31.80 kg/m   Assessment and Plan: 1. Tendonitis of wrist, right Recommend ice and massage May need ortho referral  - predniSONE (DELTASONE) 10 MG tablet; Take 6 on day 1, 5 on day 2, 4 on day 3, 3 on day 4, 2 on day 5 and 1 on day 1 then stop.  Dispense: 21 tablet; Refill: 0  2. Sacro-iliac pain Continue heat,  rest   Bari EdwardLaura Caton Popowski, MD [facility]  05/10/2016

## 2016-05-25 ENCOUNTER — Telehealth: Payer: Self-pay

## 2016-05-25 NOTE — Telephone Encounter (Signed)
Pt called requesting referral to ortho for wrist since still not better. Pt has no preference as where to sent.

## 2016-05-26 ENCOUNTER — Other Ambulatory Visit: Payer: Self-pay | Admitting: Internal Medicine

## 2016-05-26 DIAGNOSIS — M778 Other enthesopathies, not elsewhere classified: Secondary | ICD-10-CM

## 2016-05-26 DIAGNOSIS — M9904 Segmental and somatic dysfunction of sacral region: Secondary | ICD-10-CM | POA: Diagnosis not present

## 2016-05-26 DIAGNOSIS — M9907 Segmental and somatic dysfunction of upper extremity: Secondary | ICD-10-CM | POA: Diagnosis not present

## 2016-05-26 DIAGNOSIS — M545 Low back pain: Secondary | ICD-10-CM | POA: Diagnosis not present

## 2016-05-26 DIAGNOSIS — M9903 Segmental and somatic dysfunction of lumbar region: Secondary | ICD-10-CM | POA: Diagnosis not present

## 2016-05-26 DIAGNOSIS — M9902 Segmental and somatic dysfunction of thoracic region: Secondary | ICD-10-CM | POA: Diagnosis not present

## 2016-05-26 NOTE — Telephone Encounter (Signed)
Referral to [facility] placed.  

## 2016-05-26 NOTE — Telephone Encounter (Signed)
Spoke to pt and informed him of ortho referral being placed per Dr. Judithann GravesBerglund.

## 2016-05-29 DIAGNOSIS — M9903 Segmental and somatic dysfunction of lumbar region: Secondary | ICD-10-CM | POA: Diagnosis not present

## 2016-05-29 DIAGNOSIS — M5442 Lumbago with sciatica, left side: Secondary | ICD-10-CM | POA: Diagnosis not present

## 2016-05-29 DIAGNOSIS — M9907 Segmental and somatic dysfunction of upper extremity: Secondary | ICD-10-CM | POA: Diagnosis not present

## 2016-05-30 DIAGNOSIS — M9903 Segmental and somatic dysfunction of lumbar region: Secondary | ICD-10-CM | POA: Diagnosis not present

## 2016-05-30 DIAGNOSIS — M9907 Segmental and somatic dysfunction of upper extremity: Secondary | ICD-10-CM | POA: Diagnosis not present

## 2016-05-30 DIAGNOSIS — M5442 Lumbago with sciatica, left side: Secondary | ICD-10-CM | POA: Diagnosis not present

## 2016-06-01 DIAGNOSIS — M9907 Segmental and somatic dysfunction of upper extremity: Secondary | ICD-10-CM | POA: Diagnosis not present

## 2016-06-01 DIAGNOSIS — M5442 Lumbago with sciatica, left side: Secondary | ICD-10-CM | POA: Diagnosis not present

## 2016-06-01 DIAGNOSIS — M9903 Segmental and somatic dysfunction of lumbar region: Secondary | ICD-10-CM | POA: Diagnosis not present

## 2016-06-01 DIAGNOSIS — M9901 Segmental and somatic dysfunction of cervical region: Secondary | ICD-10-CM | POA: Diagnosis not present

## 2016-06-02 DIAGNOSIS — M5441 Lumbago with sciatica, right side: Secondary | ICD-10-CM | POA: Diagnosis not present

## 2016-06-02 DIAGNOSIS — Z6830 Body mass index (BMI) 30.0-30.9, adult: Secondary | ICD-10-CM | POA: Diagnosis not present

## 2016-06-02 DIAGNOSIS — M5442 Lumbago with sciatica, left side: Secondary | ICD-10-CM | POA: Diagnosis not present

## 2016-06-02 DIAGNOSIS — R030 Elevated blood-pressure reading, without diagnosis of hypertension: Secondary | ICD-10-CM | POA: Diagnosis not present

## 2016-06-05 DIAGNOSIS — M9903 Segmental and somatic dysfunction of lumbar region: Secondary | ICD-10-CM | POA: Diagnosis not present

## 2016-06-05 DIAGNOSIS — M5442 Lumbago with sciatica, left side: Secondary | ICD-10-CM | POA: Diagnosis not present

## 2016-06-05 DIAGNOSIS — M9901 Segmental and somatic dysfunction of cervical region: Secondary | ICD-10-CM | POA: Diagnosis not present

## 2016-06-05 DIAGNOSIS — M9907 Segmental and somatic dysfunction of upper extremity: Secondary | ICD-10-CM | POA: Diagnosis not present

## 2016-06-07 ENCOUNTER — Emergency Department (HOSPITAL_COMMUNITY): Payer: BLUE CROSS/BLUE SHIELD

## 2016-06-07 ENCOUNTER — Inpatient Hospital Stay (HOSPITAL_COMMUNITY)
Admission: EM | Admit: 2016-06-07 | Discharge: 2016-06-12 | DRG: 519 | Disposition: A | Discharge status: Rehabilitation Facility | Payer: BLUE CROSS/BLUE SHIELD | Attending: Neurosurgery | Admitting: Neurosurgery

## 2016-06-07 ENCOUNTER — Emergency Department (HOSPITAL_COMMUNITY): Payer: BLUE CROSS/BLUE SHIELD | Admitting: Anesthesiology

## 2016-06-07 ENCOUNTER — Encounter (HOSPITAL_COMMUNITY): Payer: Self-pay | Admitting: Emergency Medicine

## 2016-06-07 ENCOUNTER — Encounter (HOSPITAL_COMMUNITY)
Admission: EM | Disposition: A | Discharge status: Rehabilitation Facility | Payer: Self-pay | Source: Home / Self Care | Attending: Neurosurgery

## 2016-06-07 DIAGNOSIS — R208 Other disturbances of skin sensation: Secondary | ICD-10-CM | POA: Diagnosis not present

## 2016-06-07 DIAGNOSIS — R269 Unspecified abnormalities of gait and mobility: Secondary | ICD-10-CM | POA: Diagnosis not present

## 2016-06-07 DIAGNOSIS — I10 Essential (primary) hypertension: Secondary | ICD-10-CM | POA: Diagnosis not present

## 2016-06-07 DIAGNOSIS — Z8249 Family history of ischemic heart disease and other diseases of the circulatory system: Secondary | ICD-10-CM

## 2016-06-07 DIAGNOSIS — M792 Neuralgia and neuritis, unspecified: Secondary | ICD-10-CM

## 2016-06-07 DIAGNOSIS — M48061 Spinal stenosis, lumbar region without neurogenic claudication: Secondary | ICD-10-CM | POA: Diagnosis present

## 2016-06-07 DIAGNOSIS — Z888 Allergy status to other drugs, medicaments and biological substances status: Secondary | ICD-10-CM | POA: Diagnosis not present

## 2016-06-07 DIAGNOSIS — M545 Low back pain, unspecified: Secondary | ICD-10-CM

## 2016-06-07 DIAGNOSIS — Z79899 Other long term (current) drug therapy: Secondary | ICD-10-CM | POA: Diagnosis not present

## 2016-06-07 DIAGNOSIS — Y848 Other medical procedures as the cause of abnormal reaction of the patient, or of later complication, without mention of misadventure at the time of the procedure: Secondary | ICD-10-CM | POA: Diagnosis not present

## 2016-06-07 DIAGNOSIS — G8918 Other acute postprocedural pain: Secondary | ICD-10-CM

## 2016-06-07 DIAGNOSIS — G9741 Accidental puncture or laceration of dura during a procedure: Secondary | ICD-10-CM | POA: Diagnosis not present

## 2016-06-07 DIAGNOSIS — M7989 Other specified soft tissue disorders: Secondary | ICD-10-CM | POA: Diagnosis not present

## 2016-06-07 DIAGNOSIS — N319 Neuromuscular dysfunction of bladder, unspecified: Secondary | ICD-10-CM

## 2016-06-07 DIAGNOSIS — R202 Paresthesia of skin: Secondary | ICD-10-CM | POA: Diagnosis not present

## 2016-06-07 DIAGNOSIS — K592 Neurogenic bowel, not elsewhere classified: Secondary | ICD-10-CM | POA: Diagnosis not present

## 2016-06-07 DIAGNOSIS — M5126 Other intervertebral disc displacement, lumbar region: Secondary | ICD-10-CM

## 2016-06-07 DIAGNOSIS — M21372 Foot drop, left foot: Secondary | ICD-10-CM | POA: Diagnosis present

## 2016-06-07 DIAGNOSIS — Z981 Arthrodesis status: Secondary | ICD-10-CM | POA: Diagnosis not present

## 2016-06-07 DIAGNOSIS — Z87891 Personal history of nicotine dependence: Secondary | ICD-10-CM | POA: Diagnosis not present

## 2016-06-07 DIAGNOSIS — R339 Retention of urine, unspecified: Secondary | ICD-10-CM | POA: Diagnosis not present

## 2016-06-07 DIAGNOSIS — M47816 Spondylosis without myelopathy or radiculopathy, lumbar region: Secondary | ICD-10-CM | POA: Diagnosis not present

## 2016-06-07 DIAGNOSIS — G834 Cauda equina syndrome: Secondary | ICD-10-CM | POA: Diagnosis present

## 2016-06-07 DIAGNOSIS — Z419 Encounter for procedure for purposes other than remedying health state, unspecified: Secondary | ICD-10-CM

## 2016-06-07 DIAGNOSIS — Z6831 Body mass index (BMI) 31.0-31.9, adult: Secondary | ICD-10-CM

## 2016-06-07 DIAGNOSIS — M21371 Foot drop, right foot: Secondary | ICD-10-CM | POA: Diagnosis not present

## 2016-06-07 DIAGNOSIS — M5116 Intervertebral disc disorders with radiculopathy, lumbar region: Secondary | ICD-10-CM | POA: Diagnosis not present

## 2016-06-07 DIAGNOSIS — Z833 Family history of diabetes mellitus: Secondary | ICD-10-CM | POA: Diagnosis not present

## 2016-06-07 DIAGNOSIS — R2689 Other abnormalities of gait and mobility: Secondary | ICD-10-CM | POA: Diagnosis not present

## 2016-06-07 DIAGNOSIS — G8220 Paraplegia, unspecified: Secondary | ICD-10-CM | POA: Diagnosis not present

## 2016-06-07 HISTORY — PX: LUMBAR LAMINECTOMY/DECOMPRESSION MICRODISCECTOMY: SHX5026

## 2016-06-07 LAB — TYPE AND SCREEN
ABO/RH(D): A POS
Antibody Screen: NEGATIVE

## 2016-06-07 LAB — CBC WITH DIFFERENTIAL/PLATELET
Basophils Absolute: 0 10*3/uL (ref 0.0–0.1)
Basophils Relative: 0 %
Eosinophils Absolute: 0 10*3/uL (ref 0.0–0.7)
Eosinophils Relative: 0 %
HCT: 41.4 % (ref 39.0–52.0)
Hemoglobin: 14.2 g/dL (ref 13.0–17.0)
Lymphocytes Relative: 20 %
Lymphs Abs: 1.9 10*3/uL (ref 0.7–4.0)
MCH: 29.6 pg (ref 26.0–34.0)
MCHC: 34.3 g/dL (ref 30.0–36.0)
MCV: 86.3 fL (ref 78.0–100.0)
Monocytes Absolute: 0.5 10*3/uL (ref 0.1–1.0)
Monocytes Relative: 6 %
Neutro Abs: 6.9 10*3/uL (ref 1.7–7.7)
Neutrophils Relative %: 74 %
Platelets: 236 10*3/uL (ref 150–400)
RBC: 4.8 MIL/uL (ref 4.22–5.81)
RDW: 12.7 % (ref 11.5–15.5)
WBC: 9.3 10*3/uL (ref 4.0–10.5)

## 2016-06-07 LAB — BASIC METABOLIC PANEL
Anion gap: 11 (ref 5–15)
BUN: 9 mg/dL (ref 6–20)
CO2: 26 mmol/L (ref 22–32)
Calcium: 9.8 mg/dL (ref 8.9–10.3)
Chloride: 103 mmol/L (ref 101–111)
Creatinine, Ser: 0.94 mg/dL (ref 0.61–1.24)
GFR calc Af Amer: >60 mL/min (ref >60)
GFR calc non Af Amer: >60 mL/min (ref >60)
Glucose, Bld: 87 mg/dL (ref 65–99)
Potassium: 3.9 mmol/L (ref 3.5–5.1)
Sodium: 140 mmol/L (ref 135–145)

## 2016-06-07 LAB — PROTIME-INR
INR: 1.11
Prothrombin Time: 14.3 seconds (ref 11.4–15.2)

## 2016-06-07 LAB — SURGICAL PCR SCREEN
MRSA, PCR: NEGATIVE
Staphylococcus aureus: NEGATIVE

## 2016-06-07 LAB — APTT: aPTT: 34 seconds (ref 24–36)

## 2016-06-07 LAB — ABO/RH: ABO/RH(D): A POS

## 2016-06-07 SURGERY — LUMBAR LAMINECTOMY/DECOMPRESSION MICRODISCECTOMY 1 LEVEL
Anesthesia: General | Site: Back

## 2016-06-07 MED ORDER — MENTHOL 3 MG MT LOZG: 1.0000 | LOZENGE | OROMUCOSAL | Status: DC | PRN

## 2016-06-07 MED ORDER — LACTATED RINGERS IV SOLN
INTRAVENOUS | Status: DC
  Administered 2016-06-07 (×3): via INTRAVENOUS

## 2016-06-07 MED ORDER — SUFENTANIL CITRATE 50 MCG/ML IV SOLN
INTRAVENOUS | Status: AC
  Filled 2016-06-07: qty 1

## 2016-06-07 MED ORDER — PROPOFOL 10 MG/ML IV BOLUS
INTRAVENOUS | Status: AC
  Filled 2016-06-07: qty 20

## 2016-06-07 MED ORDER — MIDAZOLAM HCL 5 MG/5ML IJ SOLN
INTRAMUSCULAR | Status: DC | PRN
  Administered 2016-06-07: 2 mg via INTRAVENOUS

## 2016-06-07 MED ORDER — SODIUM CHLORIDE 0.9 % IV SOLN: 250.0000 mL | INTRAVENOUS | Status: DC

## 2016-06-07 MED ORDER — CEFAZOLIN SODIUM-DEXTROSE 2-4 GM/100ML-% IV SOLN
2.0000 g | Freq: Three times a day (TID) | INTRAVENOUS | Status: AC
  Administered 2016-06-08 (×2): 2 g via INTRAVENOUS
  Filled 2016-06-07 (×2): qty 100

## 2016-06-07 MED ORDER — BUPIVACAINE-EPINEPHRINE (PF) 0.5% -1:200000 IJ SOLN
INTRAMUSCULAR | Status: DC | PRN
  Administered 2016-06-07: 10 mL

## 2016-06-07 MED ORDER — GADOBENATE DIMEGLUMINE 529 MG/ML IV SOLN
20.0000 mL | Freq: Once | INTRAVENOUS | Status: AC
  Administered 2016-06-07: 20 mL via INTRAVENOUS

## 2016-06-07 MED ORDER — ONDANSETRON HCL 4 MG/2ML IJ SOLN
INTRAMUSCULAR | Status: DC | PRN
  Administered 2016-06-07: 4 mg via INTRAVENOUS

## 2016-06-07 MED ORDER — THROMBIN 5000 UNITS EX SOLR
OROMUCOSAL | Status: DC | PRN
  Administered 2016-06-07: 5 mL via TOPICAL

## 2016-06-07 MED ORDER — BISACODYL 10 MG RE SUPP: 10.0000 mg | Freq: Every day | RECTAL | Status: DC | PRN

## 2016-06-07 MED ORDER — ADULT MULTIVITAMIN W/MINERALS CH
1.0000 | ORAL_TABLET | Freq: Every day | ORAL | Status: DC
  Administered 2016-06-08 – 2016-06-12 (×5): 1 via ORAL
  Filled 2016-06-07 (×5): qty 1

## 2016-06-07 MED ORDER — CYCLOBENZAPRINE HCL 10 MG PO TABS: 10.0000 mg | ORAL_TABLET | Freq: Three times a day (TID) | ORAL | Status: DC | PRN

## 2016-06-07 MED ORDER — THROMBIN 5000 UNITS EX SOLR
CUTANEOUS | Status: AC
  Filled 2016-06-07: qty 5000

## 2016-06-07 MED ORDER — SODIUM CHLORIDE 0.9% FLUSH
3.0000 mL | Freq: Two times a day (BID) | INTRAVENOUS | Status: DC
  Administered 2016-06-08 – 2016-06-12 (×5): 3 mL via INTRAVENOUS

## 2016-06-07 MED ORDER — CYCLOBENZAPRINE HCL 10 MG PO TABS
10.0000 mg | ORAL_TABLET | Freq: Three times a day (TID) | ORAL | Status: DC | PRN
  Administered 2016-06-08 – 2016-06-09 (×3): 10 mg via ORAL
  Filled 2016-06-07 (×4): qty 1

## 2016-06-07 MED ORDER — MUPIROCIN 2 % EX OINT
1.0000 "application " | TOPICAL_OINTMENT | Freq: Two times a day (BID) | CUTANEOUS | Status: DC
  Administered 2016-06-07: 1 via TOPICAL

## 2016-06-07 MED ORDER — ROCURONIUM BROMIDE 10 MG/ML (PF) SYRINGE
PREFILLED_SYRINGE | INTRAVENOUS | Status: DC | PRN
  Administered 2016-06-07 (×2): 10 mg via INTRAVENOUS
  Administered 2016-06-07: 50 mg via INTRAVENOUS
  Administered 2016-06-07 (×3): 10 mg via INTRAVENOUS

## 2016-06-07 MED ORDER — OXYCODONE HCL 5 MG PO TABS
5.0000 mg | ORAL_TABLET | ORAL | Status: DC | PRN
  Administered 2016-06-09: 5 mg via ORAL
  Filled 2016-06-07: qty 1
  Filled 2016-06-07: qty 2
  Filled 2016-06-07: qty 1

## 2016-06-07 MED ORDER — GABAPENTIN 300 MG PO CAPS
300.0000 mg | ORAL_CAPSULE | Freq: Three times a day (TID) | ORAL | Status: DC
  Administered 2016-06-08 – 2016-06-12 (×15): 300 mg via ORAL
  Filled 2016-06-07 (×15): qty 1

## 2016-06-07 MED ORDER — HYDROCODONE-ACETAMINOPHEN 5-325 MG PO TABS
1.0000 | ORAL_TABLET | Freq: Once | ORAL | Status: AC
Start: 2016-06-07 — End: 2016-06-07
  Administered 2016-06-07: 1 via ORAL
  Filled 2016-06-07: qty 1

## 2016-06-07 MED ORDER — DEXAMETHASONE SODIUM PHOSPHATE 10 MG/ML IJ SOLN
INTRAMUSCULAR | Status: DC | PRN
  Administered 2016-06-07: 20 mg via INTRAVENOUS

## 2016-06-07 MED ORDER — DEXAMETHASONE SODIUM PHOSPHATE 10 MG/ML IJ SOLN
INTRAMUSCULAR | Status: AC
Start: 2016-06-07 — End: 2016-06-07
  Filled 2016-06-07: qty 2

## 2016-06-07 MED ORDER — PROPOFOL 10 MG/ML IV BOLUS
INTRAVENOUS | Status: DC | PRN
  Administered 2016-06-07: 200 mg via INTRAVENOUS

## 2016-06-07 MED ORDER — ACETAMINOPHEN 650 MG RE SUPP: 650.0000 mg | RECTAL | Status: DC | PRN

## 2016-06-07 MED ORDER — THROMBIN 5000 UNITS EX SOLR
CUTANEOUS | Status: AC
  Filled 2016-06-07: qty 10000

## 2016-06-07 MED ORDER — DOCUSATE SODIUM 100 MG PO CAPS
100.0000 mg | ORAL_CAPSULE | Freq: Two times a day (BID) | ORAL | Status: DC
  Administered 2016-06-08 – 2016-06-12 (×10): 100 mg via ORAL
  Filled 2016-06-07 (×10): qty 1

## 2016-06-07 MED ORDER — MIDAZOLAM HCL 2 MG/2ML IJ SOLN
INTRAMUSCULAR | Status: AC
  Filled 2016-06-07: qty 2

## 2016-06-07 MED ORDER — PHENOL 1.4 % MT LIQD: 1.0000 | OROMUCOSAL | Status: DC | PRN

## 2016-06-07 MED ORDER — MORPHINE SULFATE (PF) 4 MG/ML IV SOLN
4.0000 mg | INTRAVENOUS | Status: DC | PRN
  Administered 2016-06-08: 4 mg via INTRAVENOUS
  Filled 2016-06-07: qty 1

## 2016-06-07 MED ORDER — BUPIVACAINE-EPINEPHRINE (PF) 0.5% -1:200000 IJ SOLN
INTRAMUSCULAR | Status: AC
  Filled 2016-06-07: qty 30

## 2016-06-07 MED ORDER — ACETAMINOPHEN 325 MG PO TABS
650.0000 mg | ORAL_TABLET | ORAL | Status: DC | PRN
  Administered 2016-06-08: 650 mg via ORAL
  Filled 2016-06-07: qty 2

## 2016-06-07 MED ORDER — THROMBIN 5000 UNITS EX SOLR
CUTANEOUS | Status: DC | PRN
  Administered 2016-06-07 (×2): 5000 [IU] via TOPICAL

## 2016-06-07 MED ORDER — MUPIROCIN 2 % EX OINT
TOPICAL_OINTMENT | CUTANEOUS | Status: AC
  Administered 2016-06-07: 1 via TOPICAL
  Filled 2016-06-07: qty 22

## 2016-06-07 MED ORDER — SODIUM CHLORIDE 0.9 % IJ SOLN
INTRAMUSCULAR | Status: AC
  Filled 2016-06-07: qty 10

## 2016-06-07 MED ORDER — ROCURONIUM BROMIDE 50 MG/5ML IV SOSY
PREFILLED_SYRINGE | INTRAVENOUS | Status: AC
  Filled 2016-06-07: qty 5

## 2016-06-07 MED ORDER — SODIUM CHLORIDE 0.9 % IR SOLN
Status: DC | PRN
  Administered 2016-06-07: 500 mL

## 2016-06-07 MED ORDER — LIDOCAINE 2% (20 MG/ML) 5 ML SYRINGE
INTRAMUSCULAR | Status: AC
  Filled 2016-06-07: qty 5

## 2016-06-07 MED ORDER — NEOSTIGMINE METHYLSULFATE 5 MG/5ML IV SOSY
PREFILLED_SYRINGE | INTRAVENOUS | Status: AC
  Filled 2016-06-07: qty 5

## 2016-06-07 MED ORDER — CEFAZOLIN SODIUM-DEXTROSE 2-3 GM-% IV SOLR
INTRAVENOUS | Status: DC | PRN
  Administered 2016-06-07: 2 g via INTRAVENOUS

## 2016-06-07 MED ORDER — SODIUM CHLORIDE 0.9% FLUSH: 3.0000 mL | INTRAVENOUS | Status: DC | PRN

## 2016-06-07 MED ORDER — CEFAZOLIN SODIUM-DEXTROSE 2-4 GM/100ML-% IV SOLN
INTRAVENOUS | Status: AC
  Filled 2016-06-07: qty 100

## 2016-06-07 MED ORDER — ONDANSETRON HCL 4 MG PO TABS: 4.0000 mg | ORAL_TABLET | Freq: Four times a day (QID) | ORAL | Status: DC | PRN

## 2016-06-07 MED ORDER — BACITRACIN ZINC 500 UNIT/GM EX OINT
TOPICAL_OINTMENT | CUTANEOUS | Status: AC
  Filled 2016-06-07: qty 28.35

## 2016-06-07 MED ORDER — SUFENTANIL CITRATE 50 MCG/ML IV SOLN
INTRAVENOUS | Status: DC | PRN
  Administered 2016-06-07 (×6): 10 ug via INTRAVENOUS
  Administered 2016-06-07: 20 ug via INTRAVENOUS

## 2016-06-07 MED ORDER — GLYCOPYRROLATE 0.2 MG/ML IJ SOLN
INTRAMUSCULAR | Status: DC | PRN
  Administered 2016-06-07: .8 mg via INTRAVENOUS

## 2016-06-07 MED ORDER — HEMOSTATIC AGENTS (NO CHARGE) OPTIME
TOPICAL | Status: DC | PRN
  Administered 2016-06-07: 1 via TOPICAL

## 2016-06-07 MED ORDER — NEOSTIGMINE METHYLSULFATE 10 MG/10ML IV SOLN
INTRAVENOUS | Status: DC | PRN
  Administered 2016-06-07: 5 mg via INTRAVENOUS

## 2016-06-07 MED ORDER — LIDOCAINE 2% (20 MG/ML) 5 ML SYRINGE
INTRAMUSCULAR | Status: DC | PRN
  Administered 2016-06-07: 100 mg via INTRAVENOUS

## 2016-06-07 MED ORDER — 0.9 % SODIUM CHLORIDE (POUR BTL) OPTIME
TOPICAL | Status: DC | PRN
  Administered 2016-06-07: 1000 mL

## 2016-06-07 MED ORDER — HYDROMORPHONE HCL 1 MG/ML IJ SOLN: 0.2500 mg | INTRAMUSCULAR | Status: DC | PRN

## 2016-06-07 MED ORDER — ONDANSETRON HCL 4 MG/2ML IJ SOLN
INTRAMUSCULAR | Status: AC
  Filled 2016-06-07: qty 2

## 2016-06-07 MED ORDER — ONDANSETRON HCL 4 MG/2ML IJ SOLN: 4.0000 mg | Freq: Four times a day (QID) | INTRAMUSCULAR | Status: DC | PRN

## 2016-06-07 SURGICAL SUPPLY — 60 items
BAG DECANTER FOR FLEXI CONT (MISCELLANEOUS) ×2 IMPLANT
BENZOIN TINCTURE PRP APPL 2/3 (GAUZE/BANDAGES/DRESSINGS) ×2 IMPLANT
BIT DRILL NEURO 2X3.1 SFT TUCH (MISCELLANEOUS) ×1 IMPLANT
BLADE CLIPPER SURG (BLADE) IMPLANT
BUR MATCHSTICK NEURO 3.0 LAGG (BURR) ×2 IMPLANT
BUR PRECISION FLUTE 6.0 (BURR) ×2 IMPLANT
CANISTER SUCT 3000ML PPV (MISCELLANEOUS) ×2 IMPLANT
CARTRIDGE OIL MAESTRO DRILL (MISCELLANEOUS) ×1 IMPLANT
DIFFUSER DRILL AIR PNEUMATIC (MISCELLANEOUS) ×2 IMPLANT
DRAPE LAPAROTOMY 100X72X124 (DRAPES) ×2 IMPLANT
DRAPE MICROSCOPE LEICA (MISCELLANEOUS) ×2 IMPLANT
DRAPE POUCH INSTRU U-SHP 10X18 (DRAPES) ×2 IMPLANT
DRAPE SURG 17X23 STRL (DRAPES) ×8 IMPLANT
DRILL NEURO 2X3.1 SOFT TOUCH (MISCELLANEOUS) ×2
ELECT BLADE 4.0 EZ CLEAN MEGAD (MISCELLANEOUS) ×2
ELECT REM PT RETURN 9FT ADLT (ELECTROSURGICAL) ×2
ELECTRODE BLDE 4.0 EZ CLN MEGD (MISCELLANEOUS) ×1 IMPLANT
ELECTRODE REM PT RTRN 9FT ADLT (ELECTROSURGICAL) ×1 IMPLANT
GAUZE SPONGE 4X4 12PLY STRL (GAUZE/BANDAGES/DRESSINGS) ×2 IMPLANT
GAUZE SPONGE 4X4 16PLY XRAY LF (GAUZE/BANDAGES/DRESSINGS) IMPLANT
GLOVE BIO SURGEON STRL SZ 6.5 (GLOVE) ×2 IMPLANT
GLOVE BIO SURGEON STRL SZ7 (GLOVE) ×4 IMPLANT
GLOVE BIO SURGEON STRL SZ8 (GLOVE) ×2 IMPLANT
GLOVE BIO SURGEON STRL SZ8.5 (GLOVE) ×2 IMPLANT
GLOVE BIOGEL PI IND STRL 6.5 (GLOVE) ×1 IMPLANT
GLOVE BIOGEL PI IND STRL 7.5 (GLOVE) ×1 IMPLANT
GLOVE BIOGEL PI INDICATOR 6.5 (GLOVE) ×1
GLOVE BIOGEL PI INDICATOR 7.5 (GLOVE) ×1
GLOVE ECLIPSE 6.5 STRL STRAW (GLOVE) ×2 IMPLANT
GLOVE EXAM NITRILE LRG STRL (GLOVE) IMPLANT
GLOVE EXAM NITRILE XL STR (GLOVE) IMPLANT
GLOVE EXAM NITRILE XS STR PU (GLOVE) IMPLANT
GOWN STRL REUS W/ TWL LRG LVL3 (GOWN DISPOSABLE) ×3 IMPLANT
GOWN STRL REUS W/ TWL XL LVL3 (GOWN DISPOSABLE) ×1 IMPLANT
GOWN STRL REUS W/TWL 2XL LVL3 (GOWN DISPOSABLE) IMPLANT
GOWN STRL REUS W/TWL LRG LVL3 (GOWN DISPOSABLE) ×3
GOWN STRL REUS W/TWL XL LVL3 (GOWN DISPOSABLE) ×1
KIT BASIN OR (CUSTOM PROCEDURE TRAY) ×2 IMPLANT
KIT ROOM TURNOVER OR (KITS) ×2 IMPLANT
NEEDLE HYPO 21X1.5 SAFETY (NEEDLE) IMPLANT
NEEDLE HYPO 22GX1.5 SAFETY (NEEDLE) ×2 IMPLANT
NS IRRIG 1000ML POUR BTL (IV SOLUTION) ×2 IMPLANT
OIL CARTRIDGE MAESTRO DRILL (MISCELLANEOUS) ×2
PACK LAMINECTOMY NEURO (CUSTOM PROCEDURE TRAY) ×2 IMPLANT
PAD ARMBOARD 7.5X6 YLW CONV (MISCELLANEOUS) ×6 IMPLANT
PATTIES SURGICAL .5 X1 (DISPOSABLE) IMPLANT
RUBBERBAND STERILE (MISCELLANEOUS) ×4 IMPLANT
SEALANT ADHERUS EXTEND TIP (MISCELLANEOUS) ×2 IMPLANT
SPONGE SURGIFOAM ABS GEL SZ50 (HEMOSTASIS) ×2 IMPLANT
STRIP CLOSURE SKIN 1/2X4 (GAUZE/BANDAGES/DRESSINGS) ×2 IMPLANT
SUT ETHILON 3 0 FSL (SUTURE) ×2 IMPLANT
SUT PROLENE 6 0 BV (SUTURE) ×6 IMPLANT
SUT VIC AB 1 CT1 18XBRD ANBCTR (SUTURE) ×1 IMPLANT
SUT VIC AB 1 CT1 8-18 (SUTURE) ×1
SUT VIC AB 2-0 CP2 18 (SUTURE) ×2 IMPLANT
TAPE CLOTH SURG 4X10 WHT LF (GAUZE/BANDAGES/DRESSINGS) ×2 IMPLANT
TOWEL GREEN STERILE (TOWEL DISPOSABLE) ×2 IMPLANT
TOWEL GREEN STERILE FF (TOWEL DISPOSABLE) ×2 IMPLANT
TUBE CONNECTING 12X1/4 (SUCTIONS) ×2 IMPLANT
WATER STERILE IRR 1000ML POUR (IV SOLUTION) ×2 IMPLANT

## 2016-06-07 NOTE — Anesthesia Preprocedure Evaluation (Addendum)
Anesthesia Evaluation  Patient identified by MRN, date of birth, ID band Patient awake    Reviewed: Allergy & Precautions, NPO status , Patient's Chart, lab work & pertinent test results  Airway Mallampati: II  TM Distance: >3 FB Neck ROM: Full    Dental  (+) Teeth Intact, Dental Advisory Given   Pulmonary former smoker,    breath sounds clear to auscultation       Cardiovascular hypertension, Pt. on medications  Rhythm:Regular Rate:Normal     Neuro/Psych  Neuromuscular disease    GI/Hepatic negative GI ROS, Neg liver ROS,   Endo/Other  Morbid obesity  Renal/GU negative Renal ROS     Musculoskeletal  (+) Arthritis ,   Abdominal   Peds  Hematology   Anesthesia Other Findings   Reproductive/Obstetrics                            Anesthesia Physical Anesthesia Plan  ASA: II  Anesthesia Plan: General   Post-op Pain Management:    Induction: Intravenous  Airway Management Planned: Oral ETT  Additional Equipment:   Intra-op Plan:   Post-operative Plan: Extubation in OR  Informed Consent: I have reviewed the patients History and Physical, chart, labs and discussed the procedure including the risks, benefits and alternatives for the proposed anesthesia with the patient or authorized representative who has indicated his/her understanding and acceptance.   Dental advisory given  Plan Discussed with: CRNA, Anesthesiologist and Surgeon  Anesthesia Plan Comments:         Anesthesia Quick Evaluation

## 2016-06-07 NOTE — Progress Notes (Signed)
Subjective:  The patient is alert and pleasant. He is in no apparent distress.  Objective: Vital signs in last 24 hours: Temp:  [97.5 F (36.4 C)-98.9 F (37.2 C)] 98.2 F (36.8 C) (03/14 2225) Pulse Rate:  [81-87] 81 (03/14 2230) Resp:  [18-22] 18 (03/14 2230) BP: (137-160)/(74-103) 160/103 (03/14 2230) SpO2:  [97 %-100 %] 98 % (03/14 2230) Weight:  [101.2 kg (223 lb)] 101.2 kg (223 lb) (03/14 1230)  Intake/Output from previous day: No intake/output data recorded. Intake/Output this shift: Total I/O In: 2000 [I.V.:2000] Out: 575 [Urine:375; Blood:200]  Physical exam the patient is alert and pleasant. He is moving his lower extremities well.  Lab Results:  Recent Labs  06/07/16 1619  WBC 9.3  HGB 14.2  HCT 41.4  PLT 236   BMET  Recent Labs  06/07/16 1619  NA 140  K 3.9  CL 103  CO2 26  GLUCOSE 87  BUN 9  CREATININE 0.94  CALCIUM 9.8    Studies/Results: Mr Lumbar Spine W Wo Contrast  Result Date: 06/07/2016 CLINICAL DATA:  Low back pain extending into the lower extremities bilaterally. Numbness and tingling in both lower extremities. Symptoms began while doing yard work 2 weeks ago. Lumbar spine surgery 01/21/2015 EXAM: MRI LUMBAR SPINE WITHOUT AND WITH CONTRAST TECHNIQUE: Multiplanar and multiecho pulse sequences of the lumbar spine were obtained without and with intravenous contrast. CONTRAST:  20mL MULTIHANCE GADOBENATE DIMEGLUMINE 529 MG/ML IV SOLN COMPARISON:  Preoperative MRI 12/09/2014. FINDINGS: Segmentation: 5 non rib-bearing lumbar type vertebral bodies are present. Alignment: Slight retrolisthesis is present at L3-4. AP alignment is otherwise anatomic. Vertebrae: Progressive chronic endplate marrow changes are present at L3-4 in left greater than right at L4-5. Vertebral body heights are preserved. There is some fatty infiltration of the marrow. Conus medullaris: Extends to the L1 level and appears normal. Paraspinal and other soft tissues: Limited  imaging of the abdomen is unremarkable. No significant adenopathy is present. Disc levels: L1-2:  Negative. L2-3: Mild disc bulging and facet hypertrophy are present. There is no significant stenosis. L3-4: A broad-based disc protrusion is present. Bilateral laminectomies are noted. No residual central canal stenosis is present. Moderate foraminal narrowing bilaterally is stable. L4-5: A very large disc extrusion or more likely free fragment occupies the spinal canal at the disc level and posterior to the L4 vertebral body. The disc material measures 2.8 x 1.4 x 1.7 cm. Despite the laminectomies, there is severe central canal stenosis with obliteration of the thecal sac. Moderate foraminal stenosis is present bilaterally. L5-S1: Moderate facet hypertrophy has progressed bilaterally. There is no focal disc protrusion or stenosis. IMPRESSION: 1. Large disc extrusion or likely free fragment occupying the spinal canal posterior to the L4 vertebral body with severe central canal stenosis. The fragment measures 2.8 x 1.4 x 1.7 cm. 2. Interval laminectomy bilaterally at L3-4 with decompression of the central canal. 3. Residual moderate foraminal stenosis bilaterally at L3-4 and at L4-5. 4. Progressive facet hypertrophy at L5-S1 without significant stenosis. Electronically Signed   By: Marin Robertshristopher  Mattern M.D.   On: 06/07/2016 15:07   Dg Lumbar Spine 1 View  Result Date: 06/07/2016 CLINICAL DATA:  Intraoperative radiograph for laminectomy and decompression at L4-5. EXAM: LUMBAR SPINE - 1 VIEW COMPARISON:  06/07/2016 lumbar MRI. FINDINGS: Lateral radiograph of the lumbar spine demonstrates a probe at the L3-4 intervertebral disc level. Lumbar degenerative changes are present with disc space narrowing moderate L3-4 and mild at L4 through S1. Lower lumbar facet arthrosis. IMPRESSION: Lateral  radiograph of the lumbar spine demonstrates a probe at the L3-4 intervertebral disc level. Electronically Signed   By: Mitzi Hansen M.D.   On: 06/07/2016 21:24    Assessment/Plan: The patient is doing well. We'll keep his head of bed less than 20 and mobilize him tomorrow. I spoke with his wife. I'll asked Dr. Mikal Plane to see the patient in my absence.  LOS: 0 days     Trayven Lumadue D 06/07/2016, 10:44 PM

## 2016-06-07 NOTE — ED Notes (Signed)
Pt has been prescribed Neurontin and flexeril.  Took meds today prior to being brought to hospital .  They have not helped pain.

## 2016-06-07 NOTE — H&P (Signed)
Subjective: The patient is a 46 year old male immigrant from [place] on whom I performed an L3-4 and L4-5 laminectomy  in October 2016. The patient did great until recently. I saw him in the office last week when he was having some increasing pain. We set him up for a lumbar MRI.  The patient tells me that last night he began having increasing pain. This morning, when he got in his car, he had the acute onset of bilateral leg numbness and weakness. He to the ER and had a lumbar MRI done. This demonstrated a huge herniated disc at L4-5. I was called and immediately posted  the patient for surgery.  Presently the patient is alert and pleasant. He complains of bilateral leg and perineal numbness. He has weakness in his bilateral dorsiflexor/EHL. He has not urinated since 9:30 this morning. He's had no incontinence. He is not able to stand or walk because of the pain and numbness.   Past Medical History:  Diagnosis Date  . Arthritis   . Hypertension   . Neuromuscular disorder (HCC)    lumbar spinal stenosis      Past Surgical History:  Procedure Laterality Date  . BACK SURGERY  12/26/2014   laminectomy L3-4 and L4-5  . LUMBAR LAMINECTOMY/DECOMPRESSION MICRODISCECTOMY Bilateral 01/21/2015   Procedure: LUMBAR THREE-FOUR, LUMBAR FOUR-FIVE LUMBAR LAMINECTOMY/DECOMPRESSION MICRODISCECTOMY ;  Surgeon: Tressie Stalker, MD;  Location: [facility];  Service: Neurosurgery;  Laterality: Bilateral;  L34 L45 laminectomies and foraminotomies  . none  [id number]  . TONSILLECTOMY     as a child   . WISDOM TOOTH EXTRACTION     /w bone graft - also readying for implants     Allergies  Allergen Reactions  . Hydrochlorothiazide Other (See Comments)    Severe cramping    Social History  Substance Use Topics  . Smoking status: Former Smoker    Quit date: 04/28/2002  . Smokeless tobacco: Never Used  . Alcohol use 0.0 oz/week     Comment: beer q day     Family History  Problem Relation Age of Onset  .  Hypertension Mother   . CAD Mother 12  . Diabetes Mother   . Cancer Brother     nonhodgkin lymphoma   Prior to Admission medications   Medication Sig Start Date End Date Taking? Authorizing Provider  cyclobenzaprine (FLEXERIL) 10 MG tablet Take 10 mg by mouth 3 (three) times daily as needed for muscle spasms.   Yes Historical Provider, MD  gabapentin (NEURONTIN) 300 MG capsule Take 300 mg by mouth 3 (three) times daily.   Yes Historical Provider, MD  Multiple Vitamins-Minerals (MULTIVITAMIN ADULT PO) Take 1 tablet by mouth daily.    Yes Historical Provider, MD  oxyCODONE-acetaminophen (PERCOCET) 10-325 MG tablet Take 0.5 tablets by mouth daily as needed for pain.   Yes Historical Provider, MD  predniSONE (DELTASONE) 10 MG tablet Take 6 on day 1, 5 on day 2, 4 on day 3, 3 on day 4, 2 on day 5 and 1 on day 1 then stop. Patient not taking: Reported on 06/07/2016 05/10/16   [name], MD     Review of Systems  Positive ROS: As above  All other systems have been reviewed and were otherwise negative with the exception of those mentioned in the HPI and as above.  Objective: Vital signs in last 24 hours: Temp:  [97.5 F (36.4 C)-98.9 F (37.2 C)] 98.9 F (37.2 C) (03/14 1616) Pulse Rate:  [82-87] 87 (03/14  1616) Resp:  [18-22] 18 (03/14 1616) BP: (137)/(74-90) 137/90 (03/14 1616) SpO2:  [97 %-100 %] 97 % (03/14 1616) Weight:  [101.2 kg (223 lb)] 101.2 kg (223 lb) (03/14 1230)  General Appearance: Alert Head: Normocephalic, without obvious abnormality, atraumatic Eyes: PERRL, conjunctiva/corneas clear, EOM's intact,    Ears: Normal  Throat: Normal  Neck: Supple, Back: The patient's lumbar incision is well-healed. Lungs: Clear to auscultation bilaterally, respirations unlabored Heart: Regular rate and rhythm, no murmur, rub or gallop Abdomen: Soft, non-tender Extremities: Extremities normal, atraumatic, no cyanosis or edema Skin: unremarkable  NEUROLOGIC:   Mental status:  alert and oriented,Motor Exam -the patient's strength is grossly normal as bilateral quadriceps area he has slight weakness of bile gastrocnemius at 4+ over 5. His left extensor hallucis longus is proximate 3/5. His right extensor hallucis longus is 4/5. Sensory Exam - the patient has bilateral lower extremity and perineal numbness. Reflexes:  Coordination - grossly normal Gait - not tested, the patient is not ambulatory Balance - not tested Cranial Nerves: I: smell Not tested  II: visual acuity  OS: Normal  OD: Normal   II: visual fields Full to confrontation  II: pupils Equal, round, reactive to light  III,VII: ptosis None  III,IV,VI: extraocular muscles  Full ROM  V: mastication Normal  V: facial light touch sensation  Normal  V,VII: corneal reflex  Present  VII: facial muscle function - upper  Normal  VII: facial muscle function - lower Normal  VIII: hearing Not tested  IX: soft palate elevation  Normal  IX,X: gag reflex Present  XI: trapezius strength  5/5  XI: sternocleidomastoid strength 5/5  XI: neck flexion strength  5/5  XII: tongue strength  Normal    Data Review Lab Results  Component Value Date   WBC 9.3 06/07/2016   HGB 14.2 06/07/2016   HCT 41.4 06/07/2016   MCV 86.3 06/07/2016   PLT 236 06/07/2016   Lab Results  Component Value Date   NA 140 06/07/2016   K 3.9 06/07/2016   CL 103 06/07/2016   CO2 26 06/07/2016   BUN 9 06/07/2016   CREATININE 0.94 06/07/2016   GLUCOSE 87 06/07/2016   Lab Results  Component Value Date   INR 1.11 06/07/2016   I have reviewed the patient[facility]. He has a large herniated disc at L4-5 causing severe spinal stenosis.  Assessment/Plan: L4-5 herniated disc, severe spinal stenosis, lumbago, lumbar radiculopathy, cauda equina syndrome: I have discussed the situation with the patient and his wife. We have discussed the various treatment options. I have recommended an L4 laminectomy  with L4-5 discectomy. I have described the surgery to them. We have discussed the risks of surgery including the risks of anesthesia, hemorrhage, infection, spinal fluid leak, nerve injury, weakness, incontinence, etc. We have discussed the alternative treatment of a lumbar decompression and fusion. I have answered all the patient's, and his wife's, questions. He understands that some of his neurologic symptoms could be permanent. He wants to proceed with surgery. We will do this emergently. Cristi LoronJENKINS,Alayiah Fontes D 06/07/2016 6:23 PM

## 2016-06-07 NOTE — Progress Notes (Signed)
Patients BP 180s/100s in right arm.  Cuff moved to left arm and BP 158/106.  Dr. Chilton SiGreen notified.  No new orders received.  Will continue to monitor.

## 2016-06-07 NOTE — Anesthesia Procedure Notes (Signed)
Procedure Name: Intubation Date/Time: 06/07/2016 4:37 PM Performed by: Melina Copa, Wiley Magan R Pre-anesthesia Checklist: Patient identified, Emergency Drugs available, Suction available and Patient being monitored Patient Re-evaluated:Patient Re-evaluated prior to inductionOxygen Delivery Method: Circle System Utilized Preoxygenation: Pre-oxygenation with 100% oxygen Intubation Type: IV induction Ventilation: Mask ventilation without difficulty Laryngoscope Size: Mac and 4 Grade View: Grade II Tube type: Oral Tube size: 8.0 mm Number of attempts: 1 Airway Equipment and Method: Stylet Placement Confirmation: ETT inserted through vocal cords under direct vision,  positive ETCO2 and breath sounds checked- equal and bilateral Secured at: 22 cm Tube secured with: Tape Dental Injury: Teeth and Oropharynx as per pre-operative assessment

## 2016-06-07 NOTE — Progress Notes (Signed)
Patient is admitted from PACU. Patient is alert and oriented x 4. Patient oriented to room and made comfortable.

## 2016-06-07 NOTE — ED Notes (Signed)
Pt on way to MRI.  

## 2016-06-07 NOTE — Transfer of Care (Signed)
Immediate Anesthesia Transfer of Care Note  Patient: Pablo [name] Bonfiglio  Procedure(s) Performed: Procedure(s): LUMBAR LAMINECTOMY/DECOMPRESSION MICRODISCECTOMY, LUMBAR FOUR - FIVE (N/A)  Patient Location: PACU  Anesthesia Type:General  Level of Consciousness: awake and alert   Airway & Oxygen Therapy: Patient connected to nasal cannula oxygen  Post-op Assessment: Report given to RN and Post -op Vital signs reviewed and stable  Post vital signs: Reviewed and stable  Last Vitals:  Vitals:   06/07/16 1234 06/07/16 1616  BP: 137/74 137/90  Pulse: 82 87  Resp: 22 18  Temp: 36.4 C 37.2 C    Last Pain:  Vitals:   06/07/16 1616  TempSrc: Oral  PainSc:          Complications: No apparent anesthesia complications

## 2016-06-07 NOTE — ED Provider Notes (Signed)
[facility]-EM[facility]ENCY DEPT Provider Note   CSN: [id number] Arrival date & time: 06/07/16  1220     History   Chief Complaint Chief Complaint  Patient presents with  . Back Pain    HPI Andrew [name] is a 46 y.o. male.  Pt is a 46 y/o M with PMHx of htn, lumbar spinal stenosis, and previous laminectomy (L3-L4, L4-L5 on 01/21/15 by Dr. Lovell Sheehan) who presents to ED for low back pain, initial onset 2 weeks ago, describes as sharp and shooting, radiates down bilateral posterior legs, with associated numbness and tingling to bilateral Le, has tried flexeril and gabapentin without significant relief. Recently evaluated by Dr. Lovell Sheehan, neurosurg approx 5 days ago and MRI scheduled for tomorrow but today, pain worse, reports difficulty ambulating due to numbness and tingling in bilateral LE. No recent trauma, fall, or known injury. Denies fevers, chills, unexplained weight loss, dizziness, Cp, SOB, cough, abd pain, n/v/d, dysuria, hematuria, bladder or bowel dysfunction, saddle anesthesia, or any additional concerns. No anticoag use.      Past Medical History:  Diagnosis Date  . Arthritis   . Hypertension   . Neuromuscular disorder [facility])    lumbar spinal stenosis      Patient Active Problem List   Diagnosis Date Noted  . Prediabetes 08/19/2015  . Vitamin D deficiency 07/14/2015  . B12 deficiency 07/14/2015  . Obesity (BMI 30-39.9) 10/05/2014  . Lumbar disc disease with radiculopathy 10/05/2014    Past Surgical History:  Procedure Laterality Date  . BACK SURGERY  12/26/2014   laminectomy L3-4 and L4-5  . LUMBAR LAMINECTOMY/DECOMPRESSION MICRODISCECTOMY Bilateral 01/21/2015   Procedure: LUMBAR THREE-FOUR, LUMBAR FOUR-FIVE LUMBAR LAMINECTOMY/DECOMPRESSION MICRODISCECTOMY ;  Surgeon: Tressie Stalker, MD;  Location: [facility];  Service: Neurosurgery;  Laterality: Bilateral;  L34 L45 laminectomies and foraminotomies  . none  [id number]  . TONSILLECTOMY     as a child   . WISDOM TOOTH  EXTRACTION     /w bone graft - also readying for implants        Home Medications    Prior to Admission medications   Medication Sig Start Date End Date Taking? Authorizing Provider  Multiple Vitamins-Minerals (MULTIVITAMIN ADULT PO) Take by mouth.    Historical Provider, MD  predniSONE (DELTASONE) 10 MG tablet Take 6 on day 1, 5 on day 2, 4 on day 3, 3 on day 4, 2 on day 5 and 1 on day 1 then stop. 05/10/16   [name], MD    Family History Family History  Problem Relation Age of Onset  . Hypertension Mother   . CAD Mother 65  . Diabetes Mother   . Cancer Brother     nonhodgkin lymphoma    Social History Social History  Substance Use Topics  . Smoking status: Former Smoker    Quit date: 04/28/2002  . Smokeless tobacco: Never Used  . Alcohol use 0.0 oz/week     Comment: beer q day      Allergies   Hydrochlorothiazide   Review of Systems Review of Systems  Constitutional: Negative for chills, fever and unexpected weight change.  Respiratory: Negative for cough and shortness of breath.   Cardiovascular: Negative for chest pain.  Gastrointestinal: Negative for abdominal pain, diarrhea, nausea and vomiting.  Genitourinary: Negative for difficulty urinating, dysuria and hematuria.  Musculoskeletal: Positive for back pain. Negative for neck pain and neck stiffness.  Skin: Negative for rash.  Neurological: Positive for numbness. Negative for dizziness, speech difficulty and headaches.  All  other systems reviewed and are negative.    Physical Exam Updated Vital Signs BP 137/74 (BP Location: Right Arm)   Pulse 82   Temp 97.5 F (36.4 C) (Oral)   Resp 22   Ht 5\' 11"  (1.803 m)   Wt 101.2 kg   SpO2 100%   BMI 31.10 kg/m   Physical Exam  Constitutional: He is oriented to person, place, and time. He appears well-developed and well-nourished.  HENT:  Head: Normocephalic and atraumatic.  Right Ear: Tympanic membrane and ear canal normal.  Left Ear: Tympanic  membrane and ear canal normal.  Nose: Nose normal.  Mouth/Throat: Uvula is midline, oropharynx is clear and moist and mucous membranes are normal.  Eyes: Conjunctivae and EOM are normal. Pupils are equal, round, and reactive to light.  Neck: Normal range of motion and full passive range of motion without pain. Neck supple.  No cervical midline tenderness, no stepoff. Full active ROM x6.   Cardiovascular: Normal rate, regular rhythm, normal heart sounds and intact distal pulses.  Exam reveals no gallop and no friction rub.   No murmur heard. Pulses:      Radial pulses are 2+ on the right side, and 2+ on the left side.       Dorsalis pedis pulses are 2+ on the right side, and 2+ on the left side.  Pulmonary/Chest: Effort normal and breath sounds normal. He has no wheezes. He has no rales.  Abdominal: Soft. Bowel sounds are normal. There is no tenderness. There is no rebound and no guarding.  Musculoskeletal: Normal range of motion.  Spontaneously moves all extremities.   Neurological: He is alert and oriented to person, place, and time. No cranial nerve deficit.  Strength 5/5 to bilateral UE and LE.   Skin: Skin is dry.  Nursing note and vitals reviewed.    ED Treatments / Results  Labs (all labs ordered are listed, but only abnormal results are displayed) Labs Reviewed - No data to display  EKG  EKG Interpretation None       Radiology No results found.  Procedures Procedures (including critical care time)  Medications Ordered in ED Medications  HYDROcodone-acetaminophen (NORCO/VICODIN) 5-325 MG per tablet 1 tablet (1 tablet Oral Given 06/07/16 1307)     Initial Impression / Assessment and Plan / ED Course  I have reviewed the triage vital signs and the nursing notes.  Pertinent labs & imaging results that were available during my care of the patient were reviewed by me and considered in my medical decision making (see chart for details).  Pt is a 46 y/o M who presents  to ED for low back pain with extremity numbness and tingling, concern for disc herniation. Will get MRI to assess for spinal cord compression. No acute neuro deficits. No bladder or bowel dysfunction, no saddle anesthesia. Doubt epidural abscess/hematoma, AAA, spinal fracture, pyelo, or nephrolithiasis. Will give analgesia and continue to monitor; plan to discuss with Dr. Lovell SheehanJenkins, neurosurg     1:57 PM Pt at MRI  3:19 PM MRI with large disc extrusion and severe canal stenosis. Pt updated on results, offered additional analgesia, but defers at this time.  Dr. Lovell SheehanJenkins office called and message left, will call back  Final Clinical Impressions(s) / ED Diagnoses   Final diagnoses:  Tingling in extremities    New Prescriptions New Prescriptions   No medications on file     Albesa SeenRobyn K Joshlyn Beadle, NP 06/07/16 1633    Azalia BilisKevin Campos, MD 06/07/16 1636

## 2016-06-07 NOTE — Anesthesia Postprocedure Evaluation (Signed)
Anesthesia Post Note  Patient: Pablo [name] Arment  Procedure(s) Performed: Procedure(s) (LRB): LUMBAR LAMINECTOMY/DECOMPRESSION MICRODISCECTOMY, LUMBAR FOUR - FIVE (N/A)  Patient location during evaluation: PACU Anesthesia Type: General Level of consciousness: awake Pain management: pain level controlled Vital Signs Assessment: post-procedure vital signs reviewed and stable Respiratory status: spontaneous breathing Cardiovascular status: stable Anesthetic complications: no       Last Vitals:  Vitals:   06/07/16 2230 06/07/16 2245  BP: (!) 160/103 (!) 193/108  Pulse: 81 (!) 113  Resp: 18 (!) 23  Temp:      Last Pain:  Vitals:   06/07/16 1616  TempSrc: Oral  PainSc:                  Bence Trapp

## 2016-06-07 NOTE — Op Note (Signed)
Brief history: The patient is a 46 year old male [place] on whom I performed an L4-5 laminectomy in October 2016. The patient did very well until recently when developed back and leg pain. He was in the process of getting an MRI when this morning he got into his car and had the acute onset of bilateral lower extremity numbness and weakness with perineal numbness. The patient went to the ER and was worked up with a lumbar MRI which demonstrated a huge herniated disc at L4-5 with severe spinal stenosis. I discussed the various treatment options with the patient and recommended he undergo an L4-5 laminectomy and discectomy. The patient has weighed the risks, benefits, and alternatives to surgery and decided to proceed with the operation.  Preoperative diagnosis: L4-5 herniated disc, spinal stenosis, cauda equina syndrome  Postop diagnosis: The same  Procedure: L4 laminectomy with L4-5 discectomy using microdissection  Urgent: Dr. Delma OfficerJeff Dwyne Hasegawa  Assistant: Dr. Mikal Planeabell  Anesthesia: Gen. endotracheal  Estimated blood loss: 100 mL  Specimens: None  Complications: None  Description of procedure: The patient was brought to the operating room by the anesthesia team. General endotracheal anesthesia was induced. The patient was turned to the prone position on the Wilson frame. His lumbar region was then shaved with clippers and prepared with Betadine scrub and Betadine solution. Sterile drapes were applied. I then injected the area to be incised with Marcaine with epinephrine solution. I used a 15 blade scalpel to make a linear midline incision over the L4-5 interspace, incising through the old surgical scar. I used electrocautery to perform a bilateral subperiosteal dissection exposing the spinous process and lamina of L3, L4 and L5. We obtained an intraoperative radiograph to confirm our location. I inserted the McCullough retractor for exposure.  We began the decompression by incising the  interspinous ligament at L3-4 and L4-5 with a scalpel. I used the [employer] Roger or to remove the L4 spinous process.  We then brought the operative scope into the field and under his magnification and illumination completed the microdissection. We used a high-speed drill to extend the patient's prior L4 laminotomies in a cephalad direction until we encountered some relatively non-scarred down dura. We used microdissection to free up the dura from the epidural scar tissue. We then completed the L4 laminectomy using the Kerrison punches. We did encounter a durotomy which we closed with a running 6-0 Prolene suture.  The spinal canal was severely narrowed. We carefully dissected around the thecal sac and identified a huge herniated disc behind the L4 vertebral body. This disc herniation was adherent to the dura. We freed up from the dura and removed it in multiple fragments using the pituitary forceps. We dissected up to the L3-4 disc space down to the L4-5 disc space and noted there was no more herniated disc. We palpated along the ventral surface of the thecal sac and noted the neural structures were well decompressed. We obtained hemostasis using bipolar electrocautery. We irrigated the wound out with bacitracin solution. We covered the durotomy repair with adherics. We then removed the retractor and reapproximated the patient's lumbar fascia with interrupted #1 Vicryl suture. We reapproximated the patient's subcutaneous tissue with interrupted 2-0 Vicryl suture. We reapproximated the skin with a running 3-0 nylon suture. The wound was then coated with bacitracin ointment. A sterile dressing was applied. The drapes were removed. The patient was then returned to the supine position. By report all sponge, instrument, and needle counts were correct at the end this case.

## 2016-06-07 NOTE — ED Triage Notes (Addendum)
Per EMS, patient has been having lower back pain x 2 weeks with numbness from waist down.  Supposed to have MRI tomorrow and was getting blood work today but can not stand pain any longer.  Unable to ambulate due to lower extremities being numb/tingling.  Had back surgery October 2016 on lumbar.  Been ok since going camping two weeks ago.

## 2016-06-08 ENCOUNTER — Encounter (HOSPITAL_COMMUNITY): Payer: Self-pay | Admitting: Neurosurgery

## 2016-06-08 DIAGNOSIS — N319 Neuromuscular dysfunction of bladder, unspecified: Secondary | ICD-10-CM

## 2016-06-08 DIAGNOSIS — G834 Cauda equina syndrome: Secondary | ICD-10-CM

## 2016-06-08 DIAGNOSIS — M5126 Other intervertebral disc displacement, lumbar region: Secondary | ICD-10-CM

## 2016-06-08 DIAGNOSIS — K592 Neurogenic bowel, not elsewhere classified: Secondary | ICD-10-CM

## 2016-06-08 NOTE — Progress Notes (Signed)
Patient ID: Andrew [name], male   DOB: [birth year], 46 y.o.   MRN: [id number] Subjective:  The patient is alert and pleasant. He is in no apparent distress. He continues to have lower extremity and perineal numbness and weakness.  Objective: Vital signs in last 24 hours: Temp:  [97.6 F (36.4 C)-98.9 F (37.2 C)] 97.8 F (36.6 C) (03/15 0937) Pulse Rate:  [76-113] 94 (03/15 0937) Resp:  [18-23] 18 (03/15 0937) BP: (137-193)/(90-108) 140/91 (03/15 0937) SpO2:  [94 %-100 %] 100 % (03/15 0937) Weight:  [107.8 kg (237 lb 10.5 oz)] 107.8 kg (237 lb 10.5 oz) (03/14 2324)  Intake/Output from previous day: 03/14 0701 - 03/15 0700 In: 3100 [I.V.:3000; IV Piggyback:100] Out: 2875 [Urine:2675; Blood:200] Intake/Output this shift: No intake/output data recorded.  Physical exam the patient is alert and pleasant. His dressing is clean and dry. His strength is normal and his bilateral quadriceps. He has approximately 4/5 bilateral gastrocnemius strength. His left EHL strength is approximately 3/5 and 4 minus over 5 on the right.  Lab Results:  Recent Labs  06/07/16 1619  WBC 9.3  HGB 14.2  HCT 41.4  PLT 236   BMET  Recent Labs  06/07/16 1619  NA 140  K 3.9  CL 103  CO2 26  GLUCOSE 87  BUN 9  CREATININE 0.94  CALCIUM 9.8    Studies/Results: Mr Lumbar Spine W Wo Contrast  Result Date: 06/07/2016 CLINICAL DATA:  Low back pain extending into the lower extremities bilaterally. Numbness and tingling in both lower extremities. Symptoms began while doing yard work 2 weeks ago. Lumbar spine surgery 01/21/2015 EXAM: MRI LUMBAR SPINE WITHOUT AND WITH CONTRAST TECHNIQUE: Multiplanar and multiecho pulse sequences of the lumbar spine were obtained without and with intravenous contrast. CONTRAST:  20mL MULTIHANCE GADOBENATE DIMEGLUMINE 529 MG/ML IV SOLN COMPARISON:  Preoperative MRI 12/09/2014. FINDINGS: Segmentation: 5 non rib-bearing lumbar type vertebral bodies are present. Alignment: Slight  retrolisthesis is present at L3-4. AP alignment is otherwise anatomic. Vertebrae: Progressive chronic endplate marrow changes are present at L3-4 in left greater than right at L4-5. Vertebral body heights are preserved. There is some fatty infiltration of the marrow. Conus medullaris: Extends to the L1 level and appears normal. Paraspinal and other soft tissues: Limited imaging of the abdomen is unremarkable. No significant adenopathy is present. Disc levels: L1-2:  Negative. L2-3: Mild disc bulging and facet hypertrophy are present. There is no significant stenosis. L3-4: A broad-based disc protrusion is present. Bilateral laminectomies are noted. No residual central canal stenosis is present. Moderate foraminal narrowing bilaterally is stable. L4-5: A very large disc extrusion or more likely free fragment occupies the spinal canal at the disc level and posterior to the L4 vertebral body. The disc material measures 2.8 x 1.4 x 1.7 cm. Despite the laminectomies, there is severe central canal stenosis with obliteration of the thecal sac. Moderate foraminal stenosis is present bilaterally. L5-S1: Moderate facet hypertrophy has progressed bilaterally. There is no focal disc protrusion or stenosis. IMPRESSION: 1. Large disc extrusion or likely free fragment occupying the spinal canal posterior to the L4 vertebral body with severe central canal stenosis. The fragment measures 2.8 x 1.4 x 1.7 cm. 2. Interval laminectomy bilaterally at L3-4 with decompression of the central canal. 3. Residual moderate foraminal stenosis bilaterally at L3-4 and at L4-5. 4. Progressive facet hypertrophy at L5-S1 without significant stenosis. Electronically Signed   By: Marin Robertshristopher  Mattern M.D.   On: 06/07/2016 15:07   Dg Lumbar Spine 1 View  Result Date: 06/07/2016 CLINICAL DATA:  Intraoperative radiograph for laminectomy and decompression at L4-5. EXAM: LUMBAR SPINE - 1 VIEW COMPARISON:  06/07/2016 lumbar MRI. FINDINGS: Lateral  radiograph of the lumbar spine demonstrates a probe at the L3-4 intervertebral disc level. Lumbar degenerative changes are present with disc space narrowing moderate L3-4 and mild at L4 through S1. Lower lumbar facet arthrosis. IMPRESSION: Lateral radiograph of the lumbar spine demonstrates a probe at the L3-4 intervertebral disc level. Electronically Signed   By: Mitzi Hansen M.D.   On: 06/07/2016 21:24    Assessment/Plan: L4-5 herniated disc, cauda equina syndrome: I have discussed the situation extensively with the patient and his wife. I have explained that this herniated disc has hurt multiple nerves. I have explained what a cauda equina syndrome is. I think he will improve with time now that his nerves are decompressed. All history had to see the patient as he will likely need inpatient rehabilitation. I am concerned about his bladder as upon insertion of this catheter before surgery he had over a liter of urine. Will try discontinue his Foley catheter but he may need to have it reinserted. I'll asked Dr. Mikal Plane the follow-up patient in my absence. I have answered all their questions.  LOS: 1 day     Zyron Deeley D 06/08/2016, 12:54 PM

## 2016-06-08 NOTE — Consult Note (Signed)
Physical Medicine and Rehabilitation Consult Reason for Consult: Cauda equina syndrome Referring Physician: Dr. Lovell Sheehan   HPI: Andrew [name] is a 46 y.o. right handed male with history of hypertension as well as recent L3-4 and 4-5 laminectomy October 2016. Per chart review patient independent prior to admission up into the past 2 weeks. He works as a Solicitor. Lives with his wife who works during the day. Two-level home with bedroom upstairs. Patient was doing well until he returned back to neurosurgery office with complaints of increasing pain as well as acute onset of bilateral leg numbness and weakness and decreased sensation to void. MRI imaging revealed a huge herniated disc at L4-5. Underwent L4 laminectomy with L4-5 discectomy using microdissection 06/07/2016 per Dr. Lovell Sheehan. Hospital course pain management. Physical and occupational therapy evaluations are pending. M.D. has requested physical medicine rehabilitation consult.  Patient feels numb in the "private areas". Foley removed one hour ago, no void. Last bowel movement was prior to surgery. Ambulated 75 feet with min assist. Today  Review of Systems  Constitutional: Negative for chills and fever.  HENT: Negative for hearing loss and tinnitus.   Eyes: Negative for blurred vision and double vision.  Respiratory: Negative for cough and shortness of breath.   Cardiovascular: Negative for chest pain, palpitations and leg swelling.  Gastrointestinal: Positive for constipation. Negative for nausea and vomiting.  Musculoskeletal: Positive for back pain.  Skin: Negative for rash.  Neurological: Positive for sensory change and weakness. Negative for seizures.  All other systems reviewed and are negative.  Past Medical History:  Diagnosis Date  . Arthritis   . Hypertension   . Neuromuscular disorder (HCC)    lumbar spinal stenosis     Past Surgical History:  Procedure Laterality Date  . BACK SURGERY  12/26/2014   laminectomy L3-4 and L4-5  . LUMBAR LAMINECTOMY/DECOMPRESSION MICRODISCECTOMY Bilateral 01/21/2015   Procedure: LUMBAR THREE-FOUR, LUMBAR FOUR-FIVE LUMBAR LAMINECTOMY/DECOMPRESSION MICRODISCECTOMY ;  Surgeon: Tressie Stalker, MD;  Location: [facility];  Service: Neurosurgery;  Laterality: Bilateral;  L34 L45 laminectomies and foraminotomies  . LUMBAR LAMINECTOMY/DECOMPRESSION MICRODISCECTOMY N/A 06/07/2016   Procedure: LUMBAR LAMINECTOMY/DECOMPRESSION MICRODISCECTOMY, LUMBAR FOUR - FIVE;  Surgeon: Tressie Stalker, MD;  Location: [facility];  Service: Neurosurgery;  Laterality: N/A;  . none  [phone]  . TONSILLECTOMY     as a child   . WISDOM TOOTH EXTRACTION     /w bone graft - also readying for implants    Family History  Problem Relation Age of Onset  . Hypertension Mother   . CAD Mother 27  . Diabetes Mother   . Cancer Brother     nonhodgkin lymphoma   Social History:  reports that he quit smoking about 14 years ago. He has never used smokeless tobacco. He reports that he drinks alcohol. He reports that he does not use drugs. Allergies:  Allergies  Allergen Reactions  . Hydrochlorothiazide Other (See Comments)    Severe cramping   Medications Prior to Admission  Medication Sig Dispense Refill  . cyclobenzaprine (FLEXERIL) 10 MG tablet Take 10 mg by mouth 3 (three) times daily as needed for muscle spasms.    Marland Kitchen gabapentin (NEURONTIN) 300 MG capsule Take 300 mg by mouth 3 (three) times daily.    . Multiple Vitamins-Minerals (MULTIVITAMIN ADULT PO) Take 1 tablet by mouth daily.     Marland Kitchen oxyCODONE-acetaminophen (PERCOCET) 10-325 MG tablet Take 0.5 tablets by mouth daily as needed for pain.    . predniSONE (DELTASONE)  10 MG tablet Take 6 on day 1, 5 on day 2, 4 on day 3, 3 on day 4, 2 on day 5 and 1 on day 1 then stop. (Patient not taking: Reported on 06/07/2016) 21 tablet 0    Home: Home Living Family/patient expects to be discharged to:: Private residence Living Arrangements:  Spouse/significant other Available Help at Discharge: Family Type of Home: House Home Access: Stairs to enter Secretary/administrator of Steps: 4-5 Entrance Stairs-Rails: Right Home Layout: Two level, 1/2 bath on main level Alternate Level Stairs-Number of Steps: flight Alternate Level Stairs-Rails: Left Bathroom Shower/Tub: Health visitor: Standard Home Equipment: None  Functional History: Prior Function Level of Independence: Independent Comments: completely independent and working full time as a Visual merchandiser Status:  Mobility:          ADL:    Cognition: Cognition Overall Cognitive Status: Within Functional Limits for tasks assessed Orientation Level: Oriented X4 Cognition Arousal/Alertness: Awake/alert Behavior During Therapy: WFL for tasks assessed/performed Overall Cognitive Status: Within Functional Limits for tasks assessed  Blood pressure (!) 140/91, pulse 94, temperature 97.8 F (36.6 C), temperature source Oral, resp. rate 18, height 6\' 1"  (1.854 m), weight 107.8 kg (237 lb 10.5 oz), SpO2 100 %. Physical Exam  Vitals reviewed. Constitutional: He is oriented to person, place, and time. He appears well-developed.  HENT:  Head: Normocephalic.  Eyes: EOM are normal.  Neck: Normal range of motion. Neck supple. No thyromegaly present.  Cardiovascular: Normal rate, regular rhythm and normal heart sounds.   Respiratory: Effort normal and breath sounds normal. No respiratory distress.  GI: Soft. Bowel sounds are normal. He exhibits no distension.  Neurological: He is alert and oriented to person, place, and time.  Skin:  Back incision is dressed  Motor strength is 5/5 bilateral deltoid, biceps, triceps, grip 5/5 bilateral hip flexor, knee extensor 4/5, ankle dorsiflexor, trace bilateral ankle plantar flexor. Sensation absent to light touch. Left L5, left S1 and S2, intact right L5 and diminished but present. Right S1, right S2 Absent  S2, S3, S4 bilaterally  Results for orders placed or performed during the hospital encounter of 06/07/16 (from the past 24 hour(s))  Basic metabolic panel     Status: None   Collection Time: 06/07/16  4:19 PM  Result Value Ref Range   Sodium 140 135 - 145 mmol/L   Potassium 3.9 3.5 - 5.1 mmol/L   Chloride 103 101 - 111 mmol/L   CO2 26 22 - 32 mmol/L   Glucose, Bld 87 65 - 99 mg/dL   BUN 9 6 - 20 mg/dL   Creatinine, Ser 2.13 0.61 - 1.24 mg/dL   Calcium 9.8 8.9 - 08.6 mg/dL   GFR calc non Af Amer >60 >60 mL/min   GFR calc Af Amer >60 >60 mL/min   Anion gap 11 5 - 15  CBC with Differential     Status: None   Collection Time: 06/07/16  4:19 PM  Result Value Ref Range   WBC 9.3 4.0 - 10.5 K/uL   RBC 4.80 4.22 - 5.81 MIL/uL   Hemoglobin 14.2 13.0 - 17.0 g/dL   HCT 57.8 46.9 - 62.9 %   MCV 86.3 78.0 - 100.0 fL   MCH 29.6 26.0 - 34.0 pg   MCHC 34.3 30.0 - 36.0 g/dL   RDW 52.8 41.3 - 24.4 %   Platelets 236 150 - 400 K/uL   Neutrophils Relative % 74 %   Neutro Abs 6.9 1.7 - 7.7  K/uL   Lymphocytes Relative 20 %   Lymphs Abs 1.9 0.7 - 4.0 K/uL   Monocytes Relative 6 %   Monocytes Absolute 0.5 0.1 - 1.0 K/uL   Eosinophils Relative 0 %   Eosinophils Absolute 0.0 0.0 - 0.7 K/uL   Basophils Relative 0 %   Basophils Absolute 0.0 0.0 - 0.1 K/uL  Protime-INR     Status: None   Collection Time: 06/07/16  4:19 PM  Result Value Ref Range   Prothrombin Time 14.3 11.4 - 15.2 seconds   INR 1.11   APTT     Status: None   Collection Time: 06/07/16  4:19 PM  Result Value Ref Range   aPTT 34 24 - 36 seconds  Type and screen [facility]     Status: None   Collection Time: 06/07/16  4:25 PM  Result Value Ref Range   ABO/RH(D) A POS    Antibody Screen NEG    Sample Expiration 06/10/2016   ABO/Rh     Status: None   Collection Time: 06/07/16  4:25 PM  Result Value Ref Range   ABO/RH(D) A POS   Surgical pcr screen     Status: None   Collection Time: 06/07/16  6:12 PM  Result  Value Ref Range   MRSA, PCR NEGATIVE NEGATIVE   Staphylococcus aureus NEGATIVE NEGATIVE   Mr Lumbar Spine W Wo Contrast  Result Date: 06/07/2016 CLINICAL DATA:  Low back pain extending into the lower extremities bilaterally. Numbness and tingling in both lower extremities. Symptoms began while doing yard work 2 weeks ago. Lumbar spine surgery 01/21/2015 EXAM: MRI LUMBAR SPINE WITHOUT AND WITH CONTRAST TECHNIQUE: Multiplanar and multiecho pulse sequences of the lumbar spine were obtained without and with intravenous contrast. CONTRAST:  20mL MULTIHANCE GADOBENATE DIMEGLUMINE 529 MG/ML IV SOLN COMPARISON:  Preoperative MRI 12/09/2014. FINDINGS: Segmentation: 5 non rib-bearing lumbar type vertebral bodies are present. Alignment: Slight retrolisthesis is present at L3-4. AP alignment is otherwise anatomic. Vertebrae: Progressive chronic endplate marrow changes are present at L3-4 in left greater than right at L4-5. Vertebral body heights are preserved. There is some fatty infiltration of the marrow. Conus medullaris: Extends to the L1 level and appears normal. Paraspinal and other soft tissues: Limited imaging of the abdomen is unremarkable. No significant adenopathy is present. Disc levels: L1-2:  Negative. L2-3: Mild disc bulging and facet hypertrophy are present. There is no significant stenosis. L3-4: A broad-based disc protrusion is present. Bilateral laminectomies are noted. No residual central canal stenosis is present. Moderate foraminal narrowing bilaterally is stable. L4-5: A very large disc extrusion or more likely free fragment occupies the spinal canal at the disc level and posterior to the L4 vertebral body. The disc material measures 2.8 x 1.4 x 1.7 cm. Despite the laminectomies, there is severe central canal stenosis with obliteration of the thecal sac. Moderate foraminal stenosis is present bilaterally. L5-S1: Moderate facet hypertrophy has progressed bilaterally. There is no focal disc protrusion  or stenosis. IMPRESSION: 1. Large disc extrusion or likely free fragment occupying the spinal canal posterior to the L4 vertebral body with severe central canal stenosis. The fragment measures 2.8 x 1.4 x 1.7 cm. 2. Interval laminectomy bilaterally at L3-4 with decompression of the central canal. 3. Residual moderate foraminal stenosis bilaterally at L3-4 and at L4-5. 4. Progressive facet hypertrophy at L5-S1 without significant stenosis. Electronically Signed   By: Marin Robertshristopher  Mattern M.D.   On: 06/07/2016 15:07   Dg Lumbar Spine 1 View  Result Date: 06/07/2016 CLINICAL DATA:  Intraoperative radiograph for laminectomy and decompression at L4-5. EXAM: LUMBAR SPINE - 1 VIEW COMPARISON:  06/07/2016 lumbar MRI. FINDINGS: Lateral radiograph of the lumbar spine demonstrates a probe at the L3-4 intervertebral disc level. Lumbar degenerative changes are present with disc space narrowing moderate L3-4 and mild at L4 through S1. Lower lumbar facet arthrosis. IMPRESSION: Lateral radiograph of the lumbar spine demonstrates a probe at the L3-4 intervertebral disc level. Electronically Signed   By: Mitzi Hansen M.D.   On: 06/07/2016 21:24    Assessment/Plan: Diagnosis: Cauda equina syndrome with primarily sacral involvement, incomplete, side more involved than right, neurogenic bowel and bladder 1. Does the need for close, 24 hr/day medical supervision in concert with the patient's rehab needs make it unreasonable for this patient to be served in a less intensive setting? Yes 2. Co-Morbidities requiring supervision/potential complications: Hypertension, lumbar degenerative disc, status post laminectomy, discectomy, postoperative day #1 3. Due to bladder management, bowel management, safety, skin/wound care, disease management, medication administration, pain management and patient education, does the patient require 24 hr/day rehab nursing? Yes 4. Does the patient require coordinated care of a physician,  rehab nurse, PT (1-2 hrs/day, 5 days/week) and OT (1-2 hrs/day, 5 days/week) to address physical and functional deficits in the context of the above medical diagnosis(es)? Yes Addressing deficits in the following areas: balance, endurance, locomotion, strength, transferring, bowel/bladder control, bathing, dressing, feeding, grooming, toileting and psychosocial support 5. Can the patient actively participate in an intensive therapy program of at least 3 hrs of therapy per day at least 5 days per week? Yes 6. The potential for patient to make measurable gains while on inpatient rehab is good 7. Anticipated functional outcomes upon discharge from inpatient rehab are modified independent  with PT, modified independent with OT, n/a with SLP. 8. Estimated rehab length of stay to reach the above functional goals is: 7-10d 9. Does the patient have adequate social supports and living environment to accommodate these discharge functional goals? Yes 10. Anticipated D/C setting: Home 11. Anticipated post D/C treatments: HH therapy 12. Overall Rehab/Functional Prognosis: excellent  RECOMMENDATIONS: This patient's condition is appropriate for continued rehabilitative care in the following setting: CIR Patient has agreed to participate in recommended program. Yes Note that insurance prior authorization may be required for reimbursement for recommended care.  Comment: Will need to work on establishing bowel and bladder program.   Charlton Amor., PA-C 06/08/2016

## 2016-06-08 NOTE — Evaluation (Signed)
Physical Therapy Evaluation Patient Details Name: Andrew [name] MRN: [id number] DOB: 1970-04-29 Today's Date: 06/08/2016   History of Present Illness  Pt is a 46 yo male admitted through the ED on 06/07/16 due to parasthesias in perineal region and numbess in bilateral LE's. Pt was found to have a severly herniated disc at L4-L5 and underwent a laminectomy at L5-L5 with discectomy. PMH significant for OA, HTN, L3-L4, L4-L5 laminectomy and microdiscectomy on 01/21/16.  Clinical Impression  Pt is POD 1 following the above procedure. Prior to admission, pt was completely independent and working full time. Pt continues to have weakness in LE's and sensation loss which is limited his ability to safely mobilize without an assistive device. Pt will benefit from continued acute PT services in order to address the below deficits to assist with a smooth transition to venue listed below.     Follow Up Recommendations Home health PT;Supervision for mobility/OOB    Equipment Recommendations  Rolling walker with 5" wheels;3in1 (PT)    Recommendations for Other Services OT consult     Precautions / Restrictions Precautions Precautions: Back Precaution Booklet Issued: Yes (comment) Precaution Comments: hand out given and reviewed Restrictions Weight Bearing Restrictions: No      Mobility  Bed Mobility Overal bed mobility: Needs Assistance Bed Mobility: Rolling;Sidelying to Sit;Sit to Sidelying Rolling: Min guard Sidelying to sit: Min guard     Sit to sidelying: Min guard General bed mobility comments: Min guard for safety with with use of railings to assist. HOB elevated to 20 degrees.   Transfers Overall transfer level: Needs assistance Equipment used: Rolling walker (2 wheeled) Transfers: Sit to/from Stand Sit to Stand: Min guard         General transfer comment: Min guard from EOB and cues for proper hand placement  Ambulation/Gait Ambulation/Gait assistance: Min assist Ambulation  Distance (Feet): 75 Feet Assistive device: Rolling walker (2 wheeled) Gait Pattern/deviations: Step-through pattern;Decreased step length - right;Decreased step length - left;Decreased stride length;Decreased weight shift to left;Ataxic Gait velocity: decreased Gait velocity interpretation: Below normal speed for age/gender General Gait Details: pt with increased diffiuclty performing gait with mild antaxia noted due to poor sensation in LE's. Pt reports that he feels he is walking on "[place]". cues for sueqencing in RW.   Stairs            Wheelchair Mobility    Modified Rankin (Stroke Patients Only)       Balance Overall balance assessment: Needs assistance Sitting-balance support: No upper extremity supported;Feet supported Sitting balance-Leahy Scale: Good     Standing balance support: Bilateral upper extremity supported Standing balance-Leahy Scale: Poor Standing balance comment: relies on RW for stability in standing due to sensation loss                             Pertinent Vitals/Pain Pain Assessment: 0-10 Pain Score: 3  Pain Location: low back  Pain Descriptors / Indicators: Aching;Numbness Pain Intervention(s): Monitored during session;Limited activity within patient's tolerance;Premedicated before session    Home Living Family/patient expects to be discharged to:: Private residence Living Arrangements: Spouse/significant other Available Help at Discharge: Family Type of Home: House Home Access: Stairs to enter Entrance Stairs-Rails: Right Entrance Stairs-Number of Steps: 4-5 Home Layout: Two level;1/2 bath on main level Home Equipment: None      Prior Function Level of Independence: Independent         Comments: completely independent and working full time as  a Programmer, systemsplant manager     Hand Dominance   Dominant Hand: Right    Extremity/Trunk Assessment   Upper Extremity Assessment Upper Extremity Assessment: Defer to OT evaluation     Lower Extremity Assessment Lower Extremity Assessment: RLE deficits/detail;LLE deficits/detail RLE Deficits / Details: grossly 4/5, decreased ability to perform DF with 3/5, 3/5 plantarflexion. RLE Sensation: decreased light touch LLE Deficits / Details: gossly 3+/5 hip and knee. 3/5 ankle DF/PF, decreased great toe extension  LLE Sensation: decreased light touch (lateral foot)    Cervical / Trunk Assessment Cervical / Trunk Assessment: Normal  Communication   Communication: No difficulties  Cognition Arousal/Alertness: Awake/alert Behavior During Therapy: WFL for tasks assessed/performed Overall Cognitive Status: Within Functional Limits for tasks assessed                      General Comments      Exercises     Assessment/Plan    PT Assessment Patient needs continued PT services  PT Problem List Decreased strength;Decreased range of motion;Decreased activity tolerance;Decreased balance;Decreased mobility;Decreased knowledge of use of DME;Pain       PT Treatment Interventions DME instruction;Gait training;Stair training;Functional mobility training;Therapeutic activities;Therapeutic exercise;Balance training;Neuromuscular re-education;Patient/family education    PT Goals (Current goals can be found in the Care Plan section)  Acute Rehab PT Goals Patient Stated Goal: to get back to work  PT Goal Formulation: With patient/family Time For Goal Achievement: 06/15/16 Potential to Achieve Goals: Good    Frequency Min 5X/week   Barriers to discharge        Co-evaluation               End of Session Equipment Utilized During Treatment: Gait belt Activity Tolerance: Patient tolerated treatment well Patient left: in bed;with call bell/phone within reach;with family/visitor present Nurse Communication: Mobility status PT Visit Diagnosis: Ataxic gait (R26.0);Muscle weakness (generalized) (M62.81)         Time: [id number] PT Time Calculation (min)  (ACUTE ONLY): 32 min   Charges:   PT Evaluation $PT Eval Moderate Complexity: 1 Procedure PT Treatments $Gait Training: 8-22 mins   PT G Codes:         Colin BroachSabra M. Deylan Canterbury PT, DPT  [phone]  06/08/2016, 1:42 PM

## 2016-06-09 MED ORDER — METHYLPREDNISOLONE 4 MG PO TBPK
4.0000 mg | ORAL_TABLET | ORAL | Status: AC
  Administered 2016-06-09: 4 mg via ORAL

## 2016-06-09 MED ORDER — METHYLPREDNISOLONE 4 MG PO TBPK
4.0000 mg | ORAL_TABLET | Freq: Four times a day (QID) | ORAL | Status: DC
  Administered 2016-06-11 – 2016-06-12 (×6): 4 mg via ORAL

## 2016-06-09 MED ORDER — METHYLPREDNISOLONE 4 MG PO TBPK
4.0000 mg | ORAL_TABLET | Freq: Three times a day (TID) | ORAL | Status: AC
  Administered 2016-06-10 (×3): 4 mg via ORAL

## 2016-06-09 MED ORDER — METHYLPREDNISOLONE 4 MG PO TBPK
8.0000 mg | ORAL_TABLET | Freq: Every morning | ORAL | Status: AC
  Administered 2016-06-09: 8 mg via ORAL
  Filled 2016-06-09: qty 21

## 2016-06-09 MED ORDER — METHYLPREDNISOLONE 4 MG PO TBPK
8.0000 mg | ORAL_TABLET | Freq: Every evening | ORAL | Status: AC
  Administered 2016-06-10: 8 mg via ORAL

## 2016-06-09 MED ORDER — METHYLPREDNISOLONE 4 MG PO TBPK
8.0000 mg | ORAL_TABLET | Freq: Every evening | ORAL | Status: AC
  Administered 2016-06-09: 8 mg via ORAL
  Filled 2016-06-09: qty 21

## 2016-06-09 MED ORDER — METHYLPREDNISOLONE 4 MG PO TBPK: 4.0000 mg | ORAL_TABLET | ORAL | Status: AC

## 2016-06-09 NOTE — Progress Notes (Signed)
Inpatient Rehabilitation  Met with patient spouse to discuss team's recommendation for IP Rehab.  Patient eager to regain his independence and participate in the IP Rehab program.  I await OT evaluation prior to initiation of insurance authorization.  Plan for one of my co-workers to follow-up Monday, 3/19 for timing of medical readiness, insurance authorization, and bed availability.    Carmelia Roller., CCC/SLP Admission Coordinator  [facility]  Cell [phone]

## 2016-06-09 NOTE — Progress Notes (Signed)
Subjective: Patient reports Patient reports decreased pain in his legs he still reports numbness in the perineal area and weakness in his feet but that is stable since surgery not progressed. He has not been able to void catheter was taken out last night head of the in and out catheter couple times and has remained residuals around 7- 800.  Objective: Vital signs in last 24 hours: Temp:  [98 F (36.7 C)-98.5 F (36.9 C)] 98.4 F (36.9 C) (03/16 0911) Pulse Rate:  [80-92] 91 (03/16 0911) Resp:  [17-20] 18 (03/16 0911) BP: (128-153)/(82-89) 153/84 (03/16 0911) SpO2:  [94 %-100 %] 98 % (03/16 0911)  Intake/Output from previous day: 03/15 0701 - 03/16 0700 In: -  Out: 5550 [Urine:5550] Intake/Output this shift: No intake/output data recorded.  Patient is awake and alert he was standing and ambulating in the room when I arrived his incisions clean dry and intact proximal leg strength is 5 out of 5 distally he has 4-5 dorsiflexion in the right foot 2-3 of 5 in the left foot this appears stable from Dr. Lovell Sheehan exam. Patient reports no additional weakness no additional numbness and surgery pain is much improved.  Lab Results:  Recent Labs  06/07/16 1619  WBC 9.3  HGB 14.2  HCT 41.4  PLT 236   BMET  Recent Labs  06/07/16 1619  NA 140  K 3.9  CL 103  CO2 26  GLUCOSE 87  BUN 9  CREATININE 0.94  CALCIUM 9.8    Studies/Results: Mr Lumbar Spine W Wo Contrast  Result Date: 06/07/2016 CLINICAL DATA:  Low back pain extending into the lower extremities bilaterally. Numbness and tingling in both lower extremities. Symptoms began while doing yard work 2 weeks ago. Lumbar spine surgery 01/21/2015 EXAM: MRI LUMBAR SPINE WITHOUT AND WITH CONTRAST TECHNIQUE: Multiplanar and multiecho pulse sequences of the lumbar spine were obtained without and with intravenous contrast. CONTRAST:  20mL MULTIHANCE GADOBENATE DIMEGLUMINE 529 MG/ML IV SOLN COMPARISON:  Preoperative MRI 12/09/2014. FINDINGS:  Segmentation: 5 non rib-bearing lumbar type vertebral bodies are present. Alignment: Slight retrolisthesis is present at L3-4. AP alignment is otherwise anatomic. Vertebrae: Progressive chronic endplate marrow changes are present at L3-4 in left greater than right at L4-5. Vertebral body heights are preserved. There is some fatty infiltration of the marrow. Conus medullaris: Extends to the L1 level and appears normal. Paraspinal and other soft tissues: Limited imaging of the abdomen is unremarkable. No significant adenopathy is present. Disc levels: L1-2:  Negative. L2-3: Mild disc bulging and facet hypertrophy are present. There is no significant stenosis. L3-4: A broad-based disc protrusion is present. Bilateral laminectomies are noted. No residual central canal stenosis is present. Moderate foraminal narrowing bilaterally is stable. L4-5: A very large disc extrusion or more likely free fragment occupies the spinal canal at the disc level and posterior to the L4 vertebral body. The disc material measures 2.8 x 1.4 x 1.7 cm. Despite the laminectomies, there is severe central canal stenosis with obliteration of the thecal sac. Moderate foraminal stenosis is present bilaterally. L5-S1: Moderate facet hypertrophy has progressed bilaterally. There is no focal disc protrusion or stenosis. IMPRESSION: 1. Large disc extrusion or likely free fragment occupying the spinal canal posterior to the L4 vertebral body with severe central canal stenosis. The fragment measures 2.8 x 1.4 x 1.7 cm. 2. Interval laminectomy bilaterally at L3-4 with decompression of the central canal. 3. Residual moderate foraminal stenosis bilaterally at L3-4 and at L4-5. 4. Progressive facet hypertrophy at L5-S1 without  significant stenosis. Electronically Signed   By: Marin Robertshristopher  Mattern M.D.   On: 06/07/2016 15:07   Dg Lumbar Spine 1 View  Result Date: 06/07/2016 CLINICAL DATA:  Intraoperative radiograph for laminectomy and decompression at  L4-5. EXAM: LUMBAR SPINE - 1 VIEW COMPARISON:  06/07/2016 lumbar MRI. FINDINGS: Lateral radiograph of the lumbar spine demonstrates a probe at the L3-4 intervertebral disc level. Lumbar degenerative changes are present with disc space narrowing moderate L3-4 and mild at L4 through S1. Lower lumbar facet arthrosis. IMPRESSION: Lateral radiograph of the lumbar spine demonstrates a probe at the L3-4 intervertebral disc level. Electronically Signed   By: Mitzi HansenLance  Furusawa-Stratton M.D.   On: 06/07/2016 21:24    Assessment/Plan: 46 year old gentleman making Spe[place] did recovery postoperatively. He is postop day 2 from an emergent discectomy for cauda equina syndrome he has residual peroneal numbness and bladder dysfunction substance surgery weakness in his feet is at baseline. Due to the failure of voiding trial upon removal of catheter last night we will have to replace the catheter and we'll arrange outpatient follow-up with urology. We'll continue physical and occupational therapy in the hospital today. Covering physician was called regarding the inability to void initially report was patient had a progressive neurologic deterioration an emergent CT was ordered however upon my evaluation patient's and the pathology is improved from surgery and non-progressive so I canceled CT. He certainly has voiding difficulties presented with peroneal numbness and this is the first attempt for Foley removal so this is nonprogressive.  LOS: 2 days     Carlen Rebuck P 06/09/2016, 9:45 AM

## 2016-06-09 NOTE — Clinical Social Work Note (Signed)
CSW consulted to complete disability paperwork. CSW met with pt and wife at bedside. Wife was informed that CSW required to complete paperwork. CSW looked through paperwork and saw that physician is to complete last 3 pages and other pages are to be completed by pt/spouse. Spouse and pt understood. CSW provided pen as requested. CSW signing off as no other SW needs identified.   Oretha Ellis, [place], [place] Work [phone]

## 2016-06-09 NOTE — Progress Notes (Signed)
Occupational Therapy Evaluation Patient Details Name: Andrew [name] MRN: [id number] DOB: 1970/11/24 Today's Date: 06/09/2016    History of Present Illness Pt is a 46 yo male admitted through the ED on 06/07/16 due to parasthesias in perineal region and numbess in bilateral LE's. Pt was found to have a severly herniated disc at L4-L5 and underwent a laminectomy at L5-L5 with discectomy. PMH significant for OA, HTN, L3-L4, L4-L5 laminectomy and microdiscectomy on 01/21/16.   Clinical Impression   PTA, pt independent with ADL , IADL and mobility, worked as a Solicitor and enjoyed doing Yoga and riding motorcycles. Pt presents with significant decline in functional performance, requiring min A with mobility @ RW level and mod to Max A with ADL tasks. Pt is an excellent CIR candidate and feel he can achieve modified independent level goals. Supportive family. Will follow acutely to facilitate DC to next venue of care. Pt very motivated to become stronger and more independent.    Follow Up Recommendations  CIR;Supervision/Assistance - 24 hour    Equipment Recommendations  3 in 1 bedside commode;Tub/shower bench    Recommendations for Other Services Rehab consult     Precautions / Restrictions Precautions Precautions: Back Precaution Booklet Issued:  Precaution Comments:  Restrictions Weight Bearing Restrictions: No      Mobility Bed Mobility Overal bed mobility: Needs Assistance Bed Mobility: Rolling;Sidelying to Sit;Sit to Sidelying Rolling: Supervision Sidelying to sit: Supervision     Sit to sidelying: Min guard   Transfers Overall transfer level: Needs assistance Equipment used: Rolling walker (2 wheeled) Transfers: Sit to/from Stand Sit to Stand: Min assist         Pt stating "I need to get my balance"   Balance Overall balance assessment: Needs assistance Sitting-balance support: No upper extremity supported;Feet supported Sitting balance-Leahy Scale: Good      Standing balance support: Bilateral upper extremity supported Standing balance-Leahy Scale: Poor Standing balance comment: relies on RW for stability in standing due to sensation loss    Pt with R knee buckling during mobility                        ADL Overall ADL's : Needs assistance/impaired     Grooming: Set up   Upper Body Bathing: Set up;Sitting   Lower Body Bathing: Moderate assistance;Sit to/from stand   Upper Body Dressing : Set up;Supervision/safety;Sitting   Lower Body Dressing: Sit to/from stand;Maximal assistance   Toilet Transfer: Minimal assistance;Ambulation;Cueing for safety;RW;BSC   Toileting- Clothing Manipulation and Hygiene: Maximal assistance Toileting - Clothing Manipulation Details (indicate cue type and reason): incontinenet at this time. foley. unaware of having to void     Functional mobility during ADLs: Minimal assistance;Rolling walker;Cueing for safety General ADL Comments: unable to cross ankles over knees at this time     Vision Baseline Vision/History: No visual deficits       Perception     Praxis      Pertinent Vitals/Pain Pain Assessment: Faces Pain Score: 3  Faces Pain Scale: Hurts a little bit Pain Location: low back  Pain Descriptors / Indicators: Operative site guarding;Sore Pain Intervention(s): Limited activity within patient's tolerance     Hand Dominance Right   Extremity/Trunk Assessment     Lower Extremity Assessment RLE Deficits / Details: grossly 4/5, decreased ability to perform DF with 3/5, 3/5 plantarflexion. RLE Sensation: decreased light touch LLE Deficits / Details: gossly 3+/5 hip and knee. 3/5 ankle DF/PF, decreased great toe extension  LLE  Sensation: decreased light touch (lateral foot)   Cervical / Trunk Assessment Cervical / Trunk Assessment: Other exceptions Cervical / Trunk Exceptions: weak core  Pt states he can not feel his periarea/buttocks   Communication  Communication Communication: No difficulties   Cognition Arousal/Alertness: Awake/alert Behavior During Therapy: WFL for tasks assessed/performed Overall Cognitive Status: Within Functional Limits for tasks assessed                     General Comments       Exercises Exercises: Other exercises Other Exercises Other Exercises: AAROM into dorsiflexion with sheet Other Exercises: bed level exercise with BLE to work on crossing feet over knees in preparation for LB ADL   Shoulder Instructions      Home Living Family/patient expects to be discharged to:: Private residence Living Arrangements: Spouse/significant other Available Help at Discharge: Family Type of Home: House Home Access: Stairs to enter Secretary/administratorntrance Stairs-Number of Steps: 4-5 Entrance Stairs-Rails: Right Home Layout: Two level;1/2 bath on main level;Bed/bath upstairs Alternate Level Stairs-Number of Steps: flight Alternate Level Stairs-Rails: Left Bathroom Shower/Tub: Producer, television/film/videoWalk-in shower   Bathroom Toilet: Standard Bathroom Accessibility: Yes How Accessible: Accessible via walker Home Equipment: None          Prior Functioning/Environment Level of Independence: Independent        Comments: completely independent and working full time as a Solicitorplant manager; has 2 young daughters; enjoys riding motorcycles;yoga        OT Problem List: Decreased strength;Decreased range of motion;Decreased activity tolerance;Impaired balance (sitting and/or standing);Decreased coordination;Decreased safety awareness;Decreased knowledge of use of DME or AE;Decreased knowledge of precautions;Impaired sensation;Impaired tone;Pain      OT Treatment/Interventions: Self-care/ADL training;Therapeutic exercise;DME and/or AE instruction;Therapeutic activities;Patient/family education;Balance training    OT Goals(Current goals can be found in the care plan section) Acute Rehab OT Goals Patient Stated Goal: to be independent again OT  Goal Formulation: With patient Time For Goal Achievement: 06/23/16 Potential to Achieve Goals: Good ADL Goals Pt Will Perform Grooming: with modified independence;standing Pt Will Perform Lower Body Bathing: with modified independence;sit to/from stand;with adaptive equipment;sitting/lateral leans Pt Will Perform Lower Body Dressing: with modified independence;sitting/lateral leans;with adaptive equipment;sit to/from stand Pt Will Transfer to Toilet: with modified independence;bedside commode;ambulating Pt Will Perform Toileting - Clothing Manipulation and hygiene: with modified independence;sit to/from stand Additional ADL Goal #1: Pt will independently verbalize 3/3 back precautions for ADL  OT Frequency: Min 2X/week   Barriers to D/C:            Co-evaluation              End of Session Equipment Utilized During Treatment: Gait belt;Rolling walker;Back brace (back brace used for comfort) Nurse Communication: Mobility status;Precautions  Activity Tolerance: Patient tolerated treatment well Patient left: in bed;with call bell/phone within reach;with bed alarm set;with SCD's reapplied  OT Visit Diagnosis: Unsteadiness on feet (R26.81);Ataxia, unspecified (R27.0)                ADL either performed or assessed with clinical judgement  Time: 1510-1545 OT Time Calculation (min): 35 min Charges:  OT General Charges $OT Visit: 1 Procedure OT Evaluation $OT Eval Moderate Complexity: 1 Procedure OT Treatments $Self Care/Home Management : 8-22 mins G-Codes:     [facility] Debroh Sieloff, OT/L  [phone] 06/09/2016  Clemens Lachman,Andrew [name] 06/09/2016, 4:00 PM

## 2016-06-09 NOTE — Progress Notes (Signed)
Physical Therapy Treatment Patient Details Name: Andrew [name] Tapper MRN: [id number] DOB: Sep 01, 1970 Today's Date: 06/09/2016    History of Present Illness Pt is a 46 yo male admitted through the ED on 06/07/16 due to parasthesias in perineal region and numbess in bilateral LE's. Pt was found to have a severly herniated disc at L4-L5 and underwent a laminectomy at L5-L5 with discectomy. PMH significant for OA, HTN, L3-L4, L4-L5 laminectomy and microdiscectomy on 01/21/16.    PT Comments    Pt presents with improved tolerance for gait with RW. Pt does, however, continue to have increased numbness in bilateral LE's which is limiting his ability to safely ambulate without visual assistance. Pt continues to sensation loss in his perennial region and distal feet. Pt will have to be catheterized as his bladder function has not fully returned at this time. Due to continued complications with mobility, sensation loss, and strength deficits in LE's, pt will benefit from inpatient rehab to maximize his return to independent functioning.    Follow Up Recommendations  CIR     Equipment Recommendations  Rolling walker with 5" wheels;3in1 (PT)    Recommendations for Other Services       Precautions / Restrictions Precautions Precautions: Back Precaution Booklet Issued: Yes (comment) Precaution Comments: hand out given and reviewed Restrictions Weight Bearing Restrictions: No    Mobility  Bed Mobility               General bed mobility comments: Sitting EOB when PT arrives  Transfers Overall transfer level: Needs assistance Equipment used: Rolling walker (2 wheeled) Transfers: Sit to/from Stand Sit to Stand: Min guard         General transfer comment: Min guard from EOB and cues for proper hand placement  Ambulation/Gait Ambulation/Gait assistance: Min assist Ambulation Distance (Feet): 75 Feet Assistive device: Rolling walker (2 wheeled) Gait Pattern/deviations: Step-through  pattern;Decreased step length - right;Decreased step length - left;Decreased stride length;Decreased weight shift to left;Ataxic Gait velocity: decreased Gait velocity interpretation: Below normal speed for age/gender General Gait Details: pt with increased diffiuclty performing gait with mild antaxia noted due to poor sensation in LE's. Pt reports that he feels he is walking on "[place]". cues for sueqencing in RW.    Stairs            Wheelchair Mobility    Modified Rankin (Stroke Patients Only)       Balance Overall balance assessment: Needs assistance Sitting-balance support: No upper extremity supported;Feet supported Sitting balance-Leahy Scale: Good     Standing balance support: Bilateral upper extremity supported Standing balance-Leahy Scale: Poor Standing balance comment: relies on RW for stability in standing due to sensation loss                    Cognition Arousal/Alertness: Awake/alert Behavior During Therapy: WFL for tasks assessed/performed Overall Cognitive Status: Within Functional Limits for tasks assessed                      Exercises      General Comments        Pertinent Vitals/Pain Pain Assessment: 0-10 Pain Score: 3  Pain Location: low back  Pain Descriptors / Indicators: Aching;Numbness    Home Living                      Prior Function            PT Goals (current goals can now be found in the  care plan section) Acute Rehab PT Goals Patient Stated Goal: to get back to work     Frequency    Min 5X/week      PT Plan Discharge plan needs to be updated    Co-evaluation             End of Session Equipment Utilized During Treatment: Gait belt Activity Tolerance: Patient tolerated treatment well Patient left: in chair;with call bell/phone within reach Nurse Communication: Mobility status PT Visit Diagnosis: Ataxic gait (R26.0);Muscle weakness (generalized) (M62.81)     Time: 1610-9604 PT  Time Calculation (min) (ACUTE ONLY): 28 min  Charges:  $Gait Training: 23-37 mins                    G Codes:       Colin Broach PT, DPT  [phone]  06/09/2016, 1:55 PM

## 2016-06-10 NOTE — Progress Notes (Signed)
Physical Therapy Treatment Patient Details Name: Andrew [name] MRN: [id number] DOB: Oct 14, 1970 Today's Date: 06/10/2016    History of Present Illness Pt is a 46 yo male admitted through the ED on 06/07/16 due to parasthesias in perineal region and numbess in bilateral LE's. Pt was found to have a severly herniated disc at L4-L5 and underwent a laminectomy at L5-L5 with discectomy. PMH significant for OA, HTN, L3-L4, L4-L5 laminectomy and microdiscectomy on 01/21/16.    PT Comments    Pt presents with improved mobility this session but continues to be limited with independence due to strength and sensation deficits. Pt is very reliant on RW for mobility and has decreased control of feet with weight bearing. Noted decreased strength in ankle eversion but improved strength in DF. Eversion deficits and sensation loss is leading to decreased control of LE's during gait. Pt may require orthotics in the future in order to improve gait quality. MD possibly recommending going home vs CIR. If returns home, pt will need to perform a full flight of stairs.     Follow Up Recommendations  CIR     Equipment Recommendations  Rolling walker with 5" wheels;3in1 (PT)    Recommendations for Other Services OT consult     Precautions / Restrictions Precautions Precautions: Back Precaution Booklet Issued: Yes (comment) Precaution Comments: hand out given and reviewed Restrictions Weight Bearing Restrictions: No    Mobility  Bed Mobility Overal bed mobility: Needs Assistance Bed Mobility: Rolling;Sidelying to Sit Rolling: Supervision Sidelying to sit: Supervision       General bed mobility comments: supervision for bed mobs this session  Transfers Overall transfer level: Needs assistance Equipment used: Rolling walker (2 wheeled) Transfers: Sit to/from Stand Sit to Stand: Min guard         General transfer comment: Min guard from EOB and cues for proper hand  placement  Ambulation/Gait Ambulation/Gait assistance: Min assist Ambulation Distance (Feet): 125 Feet Assistive device: Rolling walker (2 wheeled) Gait Pattern/deviations: Step-through pattern;Decreased step length - right;Decreased step length - left;Decreased stride length;Decreased weight shift to left;Ataxic Gait velocity: decreased Gait velocity interpretation: Below normal speed for age/gender General Gait Details: Pt requires cues for quad contraction during stance in order to reduce knee buckeling. Pt with improved cadence and sequencing, but requires vision in order to make up for proprioceptive and sensation loss in LE's.    Stairs            Wheelchair Mobility    Modified Rankin (Stroke Patients Only)       Balance Overall balance assessment: Needs assistance Sitting-balance support: No upper extremity supported;Feet supported Sitting balance-Leahy Scale: Good     Standing balance support: Bilateral upper extremity supported Standing balance-Leahy Scale: Poor Standing balance comment: relies on RW for stability in standing due to sensation loss                    Cognition Arousal/Alertness: Awake/alert Behavior During Therapy: WFL for tasks assessed/performed Overall Cognitive Status: Within Functional Limits for tasks assessed                      Exercises General Exercises - Lower Extremity Ankle Circles/Pumps: AROM;Both;10 reps;Supine Quad Sets: AROM;Both;10 reps;Supine Gluteal Sets: AROM;Both;10 reps;Supine    General Comments        Pertinent Vitals/Pain Pain Assessment: 0-10 Pain Score: 4  Pain Location: low back  Pain Descriptors / Indicators: Operative site guarding;Sore Pain Intervention(s): Monitored during session;Limited activity within patient's tolerance  Home Living                      Prior Function            PT Goals (current goals can now be found in the care plan section) Acute Rehab PT  Goals Patient Stated Goal: to be independent again Progress towards PT goals: Progressing toward goals    Frequency    Min 5X/week      PT Plan Current plan remains appropriate    Co-evaluation             End of Session Equipment Utilized During Treatment: Gait belt Activity Tolerance: Patient tolerated treatment well Patient left: in chair;with call bell/phone within reach Nurse Communication: Mobility status PT Visit Diagnosis: Ataxic gait (R26.0);Muscle weakness (generalized) (M62.81)     Time: 1610-9604 PT Time Calculation (min) (ACUTE ONLY): 32 min  Charges:  $Gait Training: 8-22 mins $Therapeutic Exercise: 8-22 mins                    G Codes:       Colin Broach PT, DPT  [phone]  06/10/2016, 12:45 PM

## 2016-06-10 NOTE — Progress Notes (Signed)
Patient ID: Andrew [name] Gisler, male   DOB: 05/07/1970, 46 y.o.   MRN: [id number] Patient doing well with pain feels slightly increased movement in his feet now has catheter in place.  Afebrile stable vital signs baseline bilateral foot drops incision clean dry and intact  Continue with physical outpatient therapy patient was recommended for inpatient rehabilitation however I recommended continue to see how this works out over the weekend if all the main issues are the bladder and bowel movement then possibly he might be a go home on Monday or Tuesday with a catheter in place with follow-up with urology.

## 2016-06-11 NOTE — Progress Notes (Signed)
Patient ID: Andrew [name], male   DOB: 1970-08-12, 46 y.o.   MRN: [id number] Patient feels better with increased feeling in his feet less cold sensation. However no change in weakness still remains with Foley in place  Bilateral foot drops at her baseline incision clean dry and intact  Continue physical occupational therapy. Inpatient rehabilitation is been recommended and I support what's been recommended by the physical therapist. Patient will need to have a Foley in place and the follow-up with urology. Patient will look towards placement tomorrow. Dr. Lovell SheehanJenkins to return in the morning.

## 2016-06-11 NOTE — Progress Notes (Signed)
Occupational Therapy Treatment Patient Details Name: Andrew [name] MRN: [name] DOB: 12/07/1970 Today's Date: 06/11/2016    History of present illness Pt is a 46 yo male admitted through the ED on 06/07/16 due to parasthesias in perineal region and numbess in bilateral LE's. Pt was found to have a severly herniated disc at L4-L5 and underwent a laminectomy at L5-L5 with discectomy. PMH significant for OA, HTN, L3-L4, L4-L5 laminectomy and microdiscectomy on 01/21/16.   OT comments  Pt. Making gains with skilled OT.  Remains limited with use of B LEs due to loss of sensation.  This affects pts. Ability for completion of pericare and LB ADLs.  Agree with recommendation and need for CIR level therapies prior to home.  Pt. Is motivated and eager to progress his independence with all ADLs and mobility.    Follow Up Recommendations  CIR;Supervision/Assistance - 24 hour    Equipment Recommendations  3 in 1 bedside commode;Tub/shower bench    Recommendations for Other Services Rehab consult    Precautions / Restrictions Precautions Precautions: Back       Mobility Bed Mobility               General bed mobility comments: no observed, pt. seated in recliner at beginning and end of tx. session  Transfers Overall transfer level: Needs assistance Equipment used: Rolling walker (2 wheeled) Transfers: Sit to/from [employer]Stand;Stand Pivot Transfers Sit to Stand: Min guard Stand pivot transfers: Min assist            Balance                                   ADL Overall ADL's : Needs assistance/impaired                     Lower Body Dressing: Sit to/from stand;Maximal assistance   Toilet Transfer: Minimal assistance;Ambulation;Cueing for safety;RW;BSC     Toileting - Clothing Manipulation Details (indicate cue type and reason): pt. reports that performing hygiene after BM is difficult due to loss of sensation.  states physically he can reach without breaking  precautions but he can not feel where he is reaching.   Tub/ Shower Transfer: Walk-in shower;Grab bars;Rolling walker;Ambulation Tub/Shower Transfer Details (indicate cue type and reason): reviewed technique and strategy for stepping over ledge, remains a challenge due to loss of sensation with B LEs Functional mobility during ADLs: Minimal assistance;Rolling walker;Cueing for safety General ADL Comments: unable to cross ankles over knees at this time, will benefit from continued work with compensatory strategies for LB ADLS.       Vision                     Perception     Praxis      Cognition   Behavior During Therapy: WFL for tasks assessed/performed Overall Cognitive Status: Within Functional Limits for tasks assessed                         Exercises     Shoulder Instructions       General Comments      Pertinent Vitals/ Pain       Pain Assessment: No/denies pain  Home Living  Prior Functioning/Environment              Frequency  Min 2X/week        Progress Toward Goals  OT Goals(current goals can now be found in the care plan section)  Progress towards OT goals: Progressing toward goals     Plan Discharge plan remains appropriate    Co-evaluation                 End of Session Equipment Utilized During Treatment: Gait belt;Rolling walker;Back brace  OT Visit Diagnosis: Unsteadiness on feet (R26.81);Ataxia, unspecified (R27.0)   Activity Tolerance Patient tolerated treatment well   Patient Left in chair;with call bell/phone within reach;with family/visitor present   Nurse Communication          Time: 9604-5409 OT Time Calculation (min): 15 min  Charges: OT General Charges $OT Visit: 1 Procedure OT Treatments $Self Care/Home Management : 8-22 mins   Andrew [name], COTA/L 06/11/2016, 9:59 AM

## 2016-06-11 NOTE — Progress Notes (Signed)
Physical Therapy Treatment Patient Details Name: Andrew [name] MRN: [id number] DOB: [birth year] Today's Date: 06/11/2016    History of Present Illness Pt is a 46 yo male admitted through the ED on 06/07/16 due to parasthesias in perineal region and numbess in bilateral LE's. Pt was found to have a severly herniated disc at L4-L5 and underwent a laminectomy at L5-L5 with discectomy. PMH significant for OA, HTN, L3-L4, L4-L5 laminectomy and microdiscectomy on 01/21/16.    PT Comments    Pt performed gait but remains ataxic and unstable with technique.  Pt using compensatory strategies to maintain quad and ankle control due to decreased strength, coordination and sensation.  Will continue with POC and patient remains an excellent candidate for CIR to improve these deficits listed above.  At this time patient remains a fall risk.    Follow Up Recommendations  CIR     Equipment Recommendations  Rolling walker with 5" wheels;3in1 (PT)    Recommendations for Other Services OT consult     Precautions / Restrictions Precautions Precautions: Back Precaution Booklet Issued: Yes (comment) Precaution Comments: hand out given and reviewed Restrictions Weight Bearing Restrictions: No    Mobility  Bed Mobility               General bed mobility comments: no observed, pt. seated in recliner at beginning and end of tx. session  Transfers Overall transfer level: Needs assistance Equipment used: Rolling walker (2 wheeled) Transfers: Sit to/from [employer] Sit to Stand: Min guard Stand pivot transfers: Min assist       General transfer comment: Min guard from EOB and cues for proper hand placement  Ambulation/Gait Ambulation/Gait assistance: Min assist Ambulation Distance (Feet): 130 Feet Assistive device: Rolling walker (2 wheeled) Gait Pattern/deviations: Step-through pattern;Decreased step length - right;Decreased step length - left;Decreased stride  length;Decreased weight shift to left;Ataxic Gait velocity: decreased Gait velocity interpretation: Below normal speed for age/gender General Gait Details: Pt remains to require cues for quad contraction during stance in order to reduce knee buckling. Pt with improved cadence and sequencing, but requires vision in order to make up for proprioceptive and sensation loss in LE's.   Cues for intermittent upper trunk control to avoid looking down during the entire tx.  Pt with cues for B toe off.  During quad contraction patient compensates with hip to lock into extension.  Pt remains to present with instability in B hips, knees and ankles.     Stairs            Wheelchair Mobility    Modified Rankin (Stroke Patients Only)       Balance     Sitting balance-Leahy Scale: Good       Standing balance-Leahy Scale: Poor Standing balance comment: relies on RW for stability in standing due to sensation loss                    Cognition Arousal/Alertness: Awake/alert Behavior During Therapy: WFL for tasks assessed/performed Overall Cognitive Status: Within Functional Limits for tasks assessed                      Exercises      General Comments        Pertinent Vitals/Pain Pain Assessment: Faces Faces Pain Scale: Hurts a little bit Pain Location: low back  Pain Descriptors / Indicators: Operative site guarding;Sore Pain Intervention(s): Monitored during session;Repositioned    Home Living  Prior Function            PT Goals (current goals can now be found in the care plan section) Acute Rehab PT Goals Patient Stated Goal: to be independent again Potential to Achieve Goals: Good Progress towards PT goals: Progressing toward goals    Frequency    Min 5X/week      PT Plan Current plan remains appropriate    Co-evaluation             End of Session Equipment Utilized During Treatment: Gait belt;Back brace (LSO,  able to donn independently) Activity Tolerance: Patient tolerated treatment well Patient left: in chair;with call bell/phone within reach;with family/visitor present Nurse Communication: Mobility status PT Visit Diagnosis: Ataxic gait (R26.0);Muscle weakness (generalized) (M62.81)     Time: [id number] PT Time Calculation (min) (ACUTE ONLY): 16 min  Charges:  $Gait Training: 8-22 mins                    G Codes:       Andrew [name] 06/11/2016, 11:19 AM Andrew [name], PTA pager [phone]

## 2016-06-12 ENCOUNTER — Inpatient Hospital Stay (HOSPITAL_COMMUNITY)
Admission: RE | Admit: 2016-06-12 | Discharge: 2016-06-16 | DRG: 092 | Disposition: A | Discharge status: Home / Self Care | Payer: BLUE CROSS/BLUE SHIELD | Source: Intra-hospital | Attending: Physical Medicine & Rehabilitation | Admitting: Physical Medicine & Rehabilitation

## 2016-06-12 ENCOUNTER — Encounter (HOSPITAL_COMMUNITY): Payer: Self-pay | Admitting: *Deleted

## 2016-06-12 DIAGNOSIS — R2689 Other abnormalities of gait and mobility: Principal | ICD-10-CM | POA: Diagnosis present

## 2016-06-12 DIAGNOSIS — Z888 Allergy status to other drugs, medicaments and biological substances status: Secondary | ICD-10-CM

## 2016-06-12 DIAGNOSIS — Z833 Family history of diabetes mellitus: Secondary | ICD-10-CM

## 2016-06-12 DIAGNOSIS — R339 Retention of urine, unspecified: Secondary | ICD-10-CM | POA: Diagnosis not present

## 2016-06-12 DIAGNOSIS — G8220 Paraplegia, unspecified: Secondary | ICD-10-CM | POA: Diagnosis present

## 2016-06-12 DIAGNOSIS — G8918 Other acute postprocedural pain: Secondary | ICD-10-CM

## 2016-06-12 DIAGNOSIS — R202 Paresthesia of skin: Secondary | ICD-10-CM

## 2016-06-12 DIAGNOSIS — M5126 Other intervertebral disc displacement, lumbar region: Secondary | ICD-10-CM

## 2016-06-12 DIAGNOSIS — Z981 Arthrodesis status: Secondary | ICD-10-CM | POA: Diagnosis not present

## 2016-06-12 DIAGNOSIS — R269 Unspecified abnormalities of gait and mobility: Secondary | ICD-10-CM

## 2016-06-12 DIAGNOSIS — M545 Low back pain, unspecified: Secondary | ICD-10-CM

## 2016-06-12 DIAGNOSIS — Z79899 Other long term (current) drug therapy: Secondary | ICD-10-CM | POA: Diagnosis not present

## 2016-06-12 DIAGNOSIS — I10 Essential (primary) hypertension: Secondary | ICD-10-CM

## 2016-06-12 DIAGNOSIS — Z8249 Family history of ischemic heart disease and other diseases of the circulatory system: Secondary | ICD-10-CM | POA: Diagnosis not present

## 2016-06-12 DIAGNOSIS — N319 Neuromuscular dysfunction of bladder, unspecified: Secondary | ICD-10-CM

## 2016-06-12 DIAGNOSIS — K592 Neurogenic bowel, not elsewhere classified: Secondary | ICD-10-CM | POA: Diagnosis present

## 2016-06-12 DIAGNOSIS — M7989 Other specified soft tissue disorders: Secondary | ICD-10-CM | POA: Diagnosis not present

## 2016-06-12 DIAGNOSIS — R208 Other disturbances of skin sensation: Secondary | ICD-10-CM

## 2016-06-12 DIAGNOSIS — M1990 Unspecified osteoarthritis, unspecified site: Secondary | ICD-10-CM | POA: Diagnosis not present

## 2016-06-12 DIAGNOSIS — Z87891 Personal history of nicotine dependence: Secondary | ICD-10-CM

## 2016-06-12 DIAGNOSIS — G834 Cauda equina syndrome: Secondary | ICD-10-CM | POA: Diagnosis present

## 2016-06-12 DIAGNOSIS — M792 Neuralgia and neuritis, unspecified: Secondary | ICD-10-CM

## 2016-06-12 MED ORDER — BISACODYL 10 MG RE SUPP
10.0000 mg | Freq: Every day | RECTAL | Status: DC | PRN
  Administered 2016-06-15: 10 mg via RECTAL
  Filled 2016-06-12: qty 1

## 2016-06-12 MED ORDER — CYCLOBENZAPRINE HCL 10 MG PO TABS
10.0000 mg | ORAL_TABLET | Freq: Three times a day (TID) | ORAL | 0 refills | Status: DC | PRN
Start: 2016-06-12 — End: 2016-06-15

## 2016-06-12 MED ORDER — METHYLPREDNISOLONE 4 MG PO TBPK
4.0000 mg | ORAL_TABLET | Freq: Four times a day (QID) | ORAL | Status: AC
  Administered 2016-06-12 – 2016-06-13 (×4): 4 mg via ORAL

## 2016-06-12 MED ORDER — ACETAMINOPHEN 650 MG RE SUPP
650.0000 mg | RECTAL | Status: DC | PRN
Start: 2016-06-12 — End: 2016-06-16

## 2016-06-12 MED ORDER — ADULT MULTIVITAMIN W/MINERALS CH
1.0000 | ORAL_TABLET | Freq: Every day | ORAL | Status: DC
  Administered 2016-06-13 – 2016-06-16 (×4): 1 via ORAL
  Filled 2016-06-12 (×4): qty 1

## 2016-06-12 MED ORDER — DOCUSATE SODIUM 100 MG PO CAPS
100.0000 mg | ORAL_CAPSULE | Freq: Two times a day (BID) | ORAL | Status: DC
Start: 2016-06-12 — End: 2016-06-16
  Administered 2016-06-12 – 2016-06-15 (×7): 100 mg via ORAL
  Filled 2016-06-12 (×8): qty 1

## 2016-06-12 MED ORDER — CYCLOBENZAPRINE HCL 10 MG PO TABS: 10.0000 mg | ORAL_TABLET | Freq: Three times a day (TID) | ORAL | Status: DC | PRN

## 2016-06-12 MED ORDER — ONDANSETRON HCL 4 MG PO TABS: 4.0000 mg | ORAL_TABLET | Freq: Four times a day (QID) | ORAL | Status: DC | PRN

## 2016-06-12 MED ORDER — DOCUSATE SODIUM 100 MG PO CAPS: 100.0000 mg | ORAL_CAPSULE | Freq: Two times a day (BID) | ORAL | 0 refills | Status: DC

## 2016-06-12 MED ORDER — GABAPENTIN 300 MG PO CAPS
300.0000 mg | ORAL_CAPSULE | Freq: Three times a day (TID) | ORAL | Status: DC
  Administered 2016-06-12 – 2016-06-16 (×11): 300 mg via ORAL
  Filled 2016-06-12 (×11): qty 1

## 2016-06-12 MED ORDER — SORBITOL 70 % SOLN: 30.0000 mL | Freq: Every day | Status: DC | PRN

## 2016-06-12 MED ORDER — OXYCODONE HCL 5 MG PO TABS
5.0000 mg | ORAL_TABLET | ORAL | Status: DC | PRN
  Filled 2016-06-12: qty 2

## 2016-06-12 MED ORDER — ONDANSETRON HCL 4 MG/2ML IJ SOLN: 4.0000 mg | Freq: Four times a day (QID) | INTRAMUSCULAR | Status: DC | PRN

## 2016-06-12 MED ORDER — ACETAMINOPHEN 325 MG PO TABS: 650.0000 mg | ORAL_TABLET | ORAL | Status: DC | PRN

## 2016-06-12 NOTE — H&P (Signed)
Physical Medicine and Rehabilitation Admission H&P    Chief Complaint  Patient presents with  . Back Pain  : HPI: Andrew [name] is a 46 y.o. right handed male with history of hypertension as well as recent L3-4 and 4-5 laminectomy October 2016. Per chart review and patient, patient independent prior to admission up into the past 2 weeks. He works as a Solicitor. Lives with his wife who works during the day. Two-level home with bedroom upstairs. Patient was doing well until he returned back to neurosurgery office with complaints of increasing pain as well as acute onset of bilateral leg numbness and weakness and decreased sensation to void. MRI reviewed, showing herniated disc at L4-5. Underwent L4 laminectomy with L4-5 discectomy using microdissection 06/07/2016 per Dr. Lovell Sheehan.Routine back precautions with no brace required. Hospital course pain management. Maintained on a Medrol dose pack 06/11/2016 10 doses. Ongoing urinary retention and a Foley catheter tube had been in place. Physical and occupational therapy evaluations completed with recommendations of physical medicine rehabilitation consult. Patient improving with ability to ambulate with rolling walker independently.  Patient was admitted for a comprehensive rehabilitation program.  Review of Systems  Constitutional: Negative for chills and fever.  HENT: Negative for hearing loss and tinnitus.   Eyes: Negative for blurred vision and double vision.  Respiratory: Negative for cough and shortness of breath.   Cardiovascular: Negative for chest pain, palpitations and leg swelling.  Gastrointestinal: Positive for constipation.  Genitourinary: Negative for hematuria.       Urinary retention  Musculoskeletal: Positive for back pain.  Skin: Negative for rash.  Neurological: Positive for sensory change and weakness. Negative for seizures and loss of consciousness.  All other systems reviewed and are negative.  Past Medical History:    Diagnosis Date  . Arthritis   . Hypertension   . Neuromuscular disorder (HCC)    lumbar spinal stenosis     Past Surgical History:  Procedure Laterality Date  . BACK SURGERY  12/26/2014   laminectomy L3-4 and L4-5  . LUMBAR LAMINECTOMY/DECOMPRESSION MICRODISCECTOMY Bilateral 01/21/2015   Procedure: LUMBAR THREE-FOUR, LUMBAR FOUR-FIVE LUMBAR LAMINECTOMY/DECOMPRESSION MICRODISCECTOMY ;  Surgeon: Tressie Stalker, MD;  Location: [facility];  Service: Neurosurgery;  Laterality: Bilateral;  L34 L45 laminectomies and foraminotomies  . LUMBAR LAMINECTOMY/DECOMPRESSION MICRODISCECTOMY N/A 06/07/2016   Procedure: LUMBAR LAMINECTOMY/DECOMPRESSION MICRODISCECTOMY, LUMBAR FOUR - FIVE;  Surgeon: Tressie Stalker, MD;  Location: [facility] OR;  Service: Neurosurgery;  Laterality: N/A;  . none  [id number]  . TONSILLECTOMY     as a child   . WISDOM TOOTH EXTRACTION     /w bone graft - also readying for implants    Family History  Problem Relation Age of Onset  . Hypertension Mother   . CAD Mother 75  . Diabetes Mother   . Cancer Brother     nonhodgkin lymphoma   Social History:  reports that he quit smoking about 14 years ago. He has never used smokeless tobacco. He reports that he drinks alcohol. He reports that he does not use drugs. Allergies:  Allergies  Allergen Reactions  . Hydrochlorothiazide Other (See Comments)    Severe cramping   Medications Prior to Admission  Medication Sig Dispense Refill  . cyclobenzaprine (FLEXERIL) 10 MG tablet Take 10 mg by mouth 3 (three) times daily as needed for muscle spasms.    Marland Kitchen gabapentin (NEURONTIN) 300 MG capsule Take 300 mg by mouth 3 (three) times daily.    . Multiple Vitamins-Minerals (MULTIVITAMIN ADULT PO)  Take 1 tablet by mouth daily.     Marland Kitchen oxyCODONE-acetaminophen (PERCOCET) 10-325 MG tablet Take 0.5 tablets by mouth daily as needed for pain.    . predniSONE (DELTASONE) 10 MG tablet Take 6 on day 1, 5 on day 2, 4 on day 3, 3 on day 4, 2 on day 5 and 1  on day 1 then stop. (Patient not taking: Reported on 06/07/2016) 21 tablet 0    Home: Home Living Family/patient expects to be discharged to:: Private residence Living Arrangements: Spouse/significant other Available Help at Discharge: Family Type of Home: House Home Access: Stairs to enter Secretary/administrator of Steps: 4-5 Entrance Stairs-Rails: Right Home Layout: Two level, 1/2 bath on main level, Bed/bath upstairs Alternate Level Stairs-Number of Steps: flight Alternate Level Stairs-Rails: Left Bathroom Shower/Tub: Health visitor: Standard Bathroom Accessibility: Yes Home Equipment: None   Functional History: Prior Function Level of Independence: Independent Comments: completely independent and working full time as a Solicitor; has 2 young daughters; enjoys riding motorcycles  Functional Status:  Mobility: Bed Mobility Overal bed mobility: Needs Assistance Bed Mobility: Rolling, Sidelying to Sit Rolling: Supervision Sidelying to sit: Supervision Sit to sidelying: Min guard General bed mobility comments: no observed, pt. seated in recliner at beginning and end of tx. session Transfers Overall transfer level: Needs assistance Equipment used: Rolling walker (2 wheeled) Transfers: Sit to/from Stand, [employer] Transfers Sit to Stand: Min guard Stand pivot transfers: Min assist General transfer comment: Min guard from EOB and cues for proper hand placement Ambulation/Gait Ambulation/Gait assistance: Min assist Ambulation Distance (Feet): 130 Feet Assistive device: Rolling walker (2 wheeled) Gait Pattern/deviations: Step-through pattern, Decreased step length - right, Decreased step length - left, Decreased stride length, Decreased weight shift to left, Ataxic General Gait Details: Pt remains to require cues for quad contraction during stance in order to reduce knee buckling. Pt with improved cadence and sequencing, but requires vision in order to  make up for proprioceptive and sensation loss in LE's.   Cues for intermittent upper trunk control to avoid looking down during the entire tx.  Pt with cues for B toe off.  During quad contraction patient compensates with hip to lock into extension.  Pt remains to present with instability in B hips, knees and ankles.   Gait velocity: decreased Gait velocity interpretation: Below normal speed for age/gender    ADL: ADL Overall ADL's : Needs assistance/impaired Grooming: Set up Upper Body Bathing: Set up, Sitting Lower Body Bathing: Moderate assistance, Sit to/from stand Upper Body Dressing : Set up, Supervision/safety, Sitting Lower Body Dressing: Sit to/from stand, Maximal assistance Toilet Transfer: Minimal assistance, Ambulation, Cueing for safety, RW, BSC Toileting- Clothing Manipulation and Hygiene: Maximal assistance Toileting - Clothing Manipulation Details (indicate cue type and reason): pt. reports that performing hygiene after BM is difficult due to loss of sensation.  states physically he can reach without breaking precautions but he can not feel where he is reaching.   Tub/ Shower Transfer: Psychologist, counselling, Grab bars, Agricultural consultant, Market researcher Details (indicate cue type and reason): reviewed technique and strategy for stepping over ledge, remains a challenge due to loss of sensation with B LEs Functional mobility during ADLs: Minimal assistance, Rolling walker, Cueing for safety General ADL Comments: unable to cross ankles over knees at this time, will benefit from continued work with compensatory strategies for LB ADLS.   Cognition: Cognition Overall Cognitive Status: Within Functional Limits for tasks assessed Orientation Level: Oriented X4 Cognition Arousal/Alertness: Awake/alert  Behavior During Therapy: WFL for tasks assessed/performed Overall Cognitive Status: Within Functional Limits for tasks assessed  Physical Exam: Blood pressure (!) 144/94,  pulse 85, temperature 98.3 F (36.8 C), temperature source Oral, resp. rate 20, height 6\' 1"  (1.854 m), weight 107.8 kg (237 lb 10.5 oz), SpO2 98 %. Physical Exam  Vitals reviewed. Constitutional: He is oriented to person, place, and time. He appears well-developed and well-nourished.  HENT:  Head: Normocephalic and atraumatic.  Eyes: Conjunctivae and EOM are normal. Left eye exhibits no discharge.  Neck: Normal range of motion. Neck supple. No thyromegaly present.  Cardiovascular: Normal rate, regular rhythm and normal heart sounds.   Respiratory: Effort normal and breath sounds normal. No respiratory distress.  GI: Soft. Bowel sounds are normal. He exhibits no distension. There is no tenderness.  Musculoskeletal: He exhibits no edema or tenderness.  Neurological: He is alert and oriented to person, place, and time.  Neurological:  Motor: 5/5 bilateral deltoid, biceps, triceps, grip 4+/5 bilateral hip flexor, knee extensor ankle dorsiflexor, 4/5 ankle plantar flexion Sensation absent to light touch perineal area.   Skin: Skin is warm and dry.  Psychiatric: He has a normal mood and affect. His behavior is normal. Thought content normal.    Medical Problem List and Plan: 1.  Decreased functional mobility secondary to cauda equina syndrome with large herniated disc L4-5.S/P L4 laminectomy with discectomy 06/07/2016 2.  DVT Prophylaxis/Anticoagulation: SCDs. Check vascular study 3. Pain Management: Complete Medrol dose pack, Neurontin 300 mg 3 times a day, Flexeril 10 mg 3 times a day as needed, oxycodone as needed 4. Mood: Provide emotional support 5. Neuropsych: This patient is capable of making decisions on his own behalf. 6. Skin/Wound Care: Routine skin checks 7. Fluids/Electrolytes/Nutrition: Routine I&O with follow-up chemistries 8. Neurogenic bowel and bladder. Voiding trial, check PVRs. Consider a trial of Urecholine. Adjust bowel program. Provided education 9. Hypertension. No  antihypertensive medication at this time. Monitor with increased mobility    Post Admission Physician Evaluation: 1. Preadmission assessment reviewed and changes made below. 2. Functional deficits secondary  to Cauda Equina syndrome. 3. Patient is admitted to receive collaborative, interdisciplinary care between the physiatrist, rehab nursing staff, and therapy team. 4. Patient's level of medical complexity and substantial therapy needs in context of that medical necessity cannot be provided at a lesser intensity of care such as a SNF. 5. Patient has experienced substantial functional loss from his/her baseline which was documented above under the "Functional History" and "Functional Status" headings.  Judging by the patient's diagnosis, physical exam, and functional history, the patient has potential for functional progress which will result in measurable gains while on inpatient rehab.  These gains will be of substantial and practical use upon discharge  in facilitating mobility and self-care at the household level. 6. Physiatrist will provide 24 hour management of medical needs as well as oversight of the therapy plan/treatment and provide guidance as appropriate regarding the interaction of the two. 7. The Preadmission Screening has been reviewed and patient status is unchanged unless otherwise stated above. 8. 24 hour rehab nursing will assist with bladder management, bowel management, safety, skin/wound care, disease management, pain management and patient education  and help integrate therapy concepts, techniques,education, etc. 9. PT will assess and treat for/with: Lower extremity strength, range of motion, stamina, balance, functional mobility, safety, adaptive techniques and equipment, woundcare, coping skills, pain control, education.   Goals are: Mod I. 10. OT will assess and treat for/with: ADL's, functional mobility, safety, upper  extremity strength, adaptive techniques and equipment,  wound mgt, ego support, and community reintegration.   Goals are: Mod I. Therapy may proceed with showering this patient. 11. Case Management and Social Worker will assess and treat for psychological issues and discharge planning. 12. Team conference will be held weekly to assess progress toward goals and to determine barriers to discharge. 13. Patient will receive at least 3 hours of therapy per day at least 5 days per week. 14. ELOS: 3-5 days.       15. Prognosis:  good  Maryla Morrow, MD, [place] Dom Charlton Amor., PA-C 06/12/2016

## 2016-06-12 NOTE — Progress Notes (Signed)
Rehab admissions - I have opened the case with [employer] and have sent clinicals requesting acute inpatient rehab admission.  I met with patient and he does want inpatient rehab admission.  I have just now received authorization for inpatient rehab admission.  I do have a bed on inpatient rehab and will admit to inpatient rehab today.  Call me for questions.  #[phone]

## 2016-06-12 NOTE — Clinical Social Work Note (Signed)
CSW consulted for SNF backup to [facility]. Per note, [facility] has accepted pt and will admit today. CSW signing off as no further SW needs identified. Please reconsult if new SW needs arise.   Corlis HoveJeneya Harriet Bollen, LCSWA, LCASA Clinical Social Work [phone]

## 2016-06-12 NOTE — Progress Notes (Signed)
Pt discharging to [facility] today. Unit ready to receive pt at this time. Report given to [place], Charity fundraiserN. Pt will transfer to room 4W23, Rehab Unit.

## 2016-06-12 NOTE — Progress Notes (Signed)
Physical Therapy Treatment Patient Details Name: Andrew [name] MRN: [id number] DOB: 1971-02-10 Today's Date: 06/12/2016    History of Present Illness Pt is a 46 yo male admitted through the ED on 06/07/16 due to parasthesias in perineal region and numbess in bilateral LE's. Pt was found to have a severly herniated disc at L4-L5 and underwent a laminectomy at L5-L5 with discectomy. PMH significant for OA, HTN, L3-L4, L4-L5 laminectomy and microdiscectomy on 01/21/16.    PT Comments    Patient progressing this session initiating stair training, gait with less UE support and balance practice for compensating for decreased sensation and decreased LE strength.  Continue to feel pt's gait deviations require skilled interdisciplinary inpatient rehab to maximize safety and independence prior to d/c home.    Follow Up Recommendations  CIR     Equipment Recommendations  Rolling walker with 5" wheels;3in1 (PT)    Recommendations for Other Services       Precautions / Restrictions Precautions Precautions: Back Required Braces or Orthoses: Spinal Brace Spinal Brace: Lumbar corset    Mobility  Bed Mobility               General bed mobility comments: up in chair  Transfers Overall transfer level: Needs assistance Equipment used: Rolling walker (2 wheeled) Transfers: Sit to/from Stand Sit to Stand: Min guard         General transfer comment: heavy UE reliance for sit to stand with minguard for balance especially transferring hands to walker from armrests on chair  Ambulation/Gait Ambulation/Gait assistance: Min assist Ambulation Distance (Feet): 130 Feet (x 3 with seated rest x 2) Assistive device: Rolling walker (2 wheeled) Gait Pattern/deviations: Step-through pattern;Decreased stride length;Trendelenburg;Wide base of support     General Gait Details: wore shoes during gait for improved stability in ankles/arches; demonstrated increased tibialis anterior activation and heel  strike, but continues with knee buckling during loading with popping back into extension in mid stance, then with lateral hip instability during single limb stance phase.  Ambulated short segments with wall railing and pt demonstrated forward trunk flexion during loading on side without UE support needing min A and cues for hip extension in stance.   Stairs Stairs: Yes   Stair Management: Two rails;Step to pattern;Forwards Number of Stairs: 2 General stair comments: assist for steadying, cues/demo for technique, c/o pain in back with descent  Wheelchair Mobility    Modified Rankin (Stroke Patients Only)       Balance Overall balance assessment: Needs assistance   Sitting balance-Leahy Scale: Good     Standing balance support: Bilateral upper extremity supported;Single extremity supported Standing balance-Leahy Scale: Poor Standing balance comment: needs at least single UE support                High Level Balance Comments: ant/post rocking with bilateral vs unilateral UE support (unable to come up on toes, but did weight shift over forefoot with moving hips forward; cues for improved upright stance during practice with limits of stability    Cognition Arousal/Alertness: Awake/alert Behavior During Therapy: WFL for tasks assessed/performed Overall Cognitive Status: Within Functional Limits for tasks assessed                      Exercises      General Comments        Pertinent Vitals/Pain Pain Assessment: No/denies pain    Home Living  Prior Function            PT Goals (current goals can now be found in the care plan section) Progress towards PT goals: Progressing toward goals    Frequency    Min 5X/week      PT Plan Current plan remains appropriate    Co-evaluation             End of Session Equipment Utilized During Treatment: Back brace Activity Tolerance: Patient tolerated treatment well Patient  left: in chair;with call bell/phone within reach   PT Visit Diagnosis: Ataxic gait (R26.0);Muscle weakness (generalized) (M62.81);Other abnormalities of gait and mobility (R26.89)     Time: [id number] PT Time Calculation (min) (ACUTE ONLY): 25 min  Charges:  $Gait Training: 8-22 mins $Therapeutic Activity: 8-22 mins                    G Codes:       Elray McgregorCynthia Emelin Dascenzo 06/12/2016, 12:00 PM  Sheran Lawlessyndi Logyn Dedominicis, PT [phone] 06/12/2016

## 2016-06-12 NOTE — Discharge Summary (Signed)
Physician Discharge Summary  Patient ID: Andrew [name] Tomberlin MRN: [id number] DOB/AGE: 06-26-1970 46 y.o.  Admit date: 06/07/2016 Discharge date: 06/12/2016  Admission Diagnoses:L4-5 herniated disc, cauda equina syndrome, lumbar radiculopathy, lumbago  Discharge Diagnoses: The same Active Problems:   Lumbar herniated disc   Cauda equina syndrome with neurogenic bladder (HCC)   Acute low back pain   Decreased sensation   Lumbar disc herniation   Tingling in extremities   Neurologic gait disorder   Post-op pain   Neuropathic pain   Neurogenic bowel   Neurogenic bladder   Benign essential HTN   Discharged Condition: fair  Hospital Course: I performed an emergency L4-5 bilateral redo discectomy on the patient on 06/07/2016.   The patient's postoperative course was unremarkable. His strength improved after surgery but he continued to have some perineal numbness and urinary retention. The rehabilitation team saw the patient and thought he was appropriate for inpatient rehabilitation. Arrangements were made and he was transferred to inpatient rehabilitation unit on 06/12/2016.  Consults: Physical therapy, occupational therapy, physical medicine and rehabilitation Significant Diagnostic Studies: Lumbar MRI Treatments: Bilateral L4-5 redo discectomy using microdissection Discharge Exam: Blood pressure (!) 138/92, pulse 79, temperature 98 F (36.7 C), temperature source Oral, resp. rate 18, height 6\' 1"  (1.854 m), weight 107.8 kg (237 lb 10.5 oz), SpO2 100 %. The patient is alert and pleasant. He is in no apparent distress. His wound is healing well. His strength is 4+ over 5 in his bilateral dorsiflexors and gastrocnemius.  Disposition: Rehabilitation  Discharge Instructions    Call MD for:  difficulty breathing, headache or visual disturbances    Complete by:  As directed    Call MD for:  extreme fatigue    Complete by:  As directed    Call MD for:  hives    Complete by:  As directed     Call MD for:  persistant dizziness or light-headedness    Complete by:  As directed    Call MD for:  persistant nausea and vomiting    Complete by:  As directed    Call MD for:  redness, tenderness, or signs of infection (pain, swelling, redness, odor or green/yellow discharge around incision site)    Complete by:  As directed    Call MD for:  severe uncontrolled pain    Complete by:  As directed    Call MD for:  temperature >100.4    Complete by:  As directed    Diet - low sodium heart healthy    Complete by:  As directed    Discharge instructions    Complete by:  As directed    Call [phone] for a followup appointment. Take a stool softener while you are using pain medications.   Driving Restrictions    Complete by:  As directed    Do not drive for 2 weeks.   Increase activity slowly    Complete by:  As directed    Lifting restrictions    Complete by:  As directed    Do not lift more than 5 pounds. No excessive bending or twisting.   May shower / Bathe    Complete by:  As directed    He may shower after the pain she is removed 3 days after surgery. Leave the incision alone.   No dressing needed    Complete by:  As directed      Allergies as of 06/12/2016      Reactions   Hydrochlorothiazide Other (See Comments)  Severe cramping      Medication List    STOP taking these medications   predniSONE 10 MG tablet Commonly known as:  DELTASONE     TAKE these medications   cyclobenzaprine 10 MG tablet Commonly known as:  FLEXERIL Take 10 mg by mouth 3 (three) times daily as needed for muscle spasms. What changed:  Another medication with the same name was added. Make sure you understand how and when to take each.   cyclobenzaprine 10 MG tablet Commonly known as:  FLEXERIL Take 1 tablet (10 mg total) by mouth 3 (three) times daily as needed for muscle spasms. What changed:  You were already taking a medication with the same name, and this prescription was added.  Make sure you understand how and when to take each.   docusate sodium 100 MG capsule Commonly known as:  COLACE Take 1 capsule (100 mg total) by mouth 2 (two) times daily.   gabapentin 300 MG capsule Commonly known as:  NEURONTIN Take 300 mg by mouth 3 (three) times daily.   MULTIVITAMIN ADULT PO Take 1 tablet by mouth daily.   oxyCODONE-acetaminophen 10-325 MG tablet Commonly known as:  PERCOCET Take 0.5 tablets by mouth daily as needed for pain.        SignedCristi Loron 06/12/2016, 3:12 PM

## 2016-06-12 NOTE — Progress Notes (Signed)
Andrew Colace, MD Physician Signed Physical Medicine and Rehabilitation  Consult Note Date of Service: 06/08/2016 1:07 PM  Related encounter: ED to Hosp-Admission (Current) from 06/07/2016 in [facility]     Expand All Collapse All   [] Hide copied text [] Hover for attribution information      Physical Medicine and Rehabilitation Consult Reason for Consult: Cauda equina syndrome Referring Physician: Dr. Lovell Sheehan   HPI: Andrew [name] is a 46 y.o. right handed male with history of hypertension as well as recent L3-4 and 4-5 laminectomy October 2016. Per chart review patient independent prior to admission up into the past 2 weeks. He works as a Solicitor. Lives with his wife who works during the day. Two-level home with bedroom upstairs. Patient was doing well until he returned back to neurosurgery office with complaints of increasing pain as well as acute onset of bilateral leg numbness and weakness and decreased sensation to void. MRI imaging revealed a huge herniated disc at L4-5. Underwent L4 laminectomy with L4-5 discectomy using microdissection 06/07/2016 per Dr. Lovell Sheehan. Hospital course pain management. Physical and occupational therapy evaluations are pending. M.D. has requested physical medicine rehabilitation consult.  Patient feels numb in the "private areas". Foley removed one hour ago, no void. Last bowel movement was prior to surgery. Ambulated 75 feet with min assist. Today  Review of Systems  Constitutional: Negative for chills and fever.  HENT: Negative for hearing loss and tinnitus.   Eyes: Negative for blurred vision and double vision.  Respiratory: Negative for cough and shortness of breath.   Cardiovascular: Negative for chest pain, palpitations and leg swelling.  Gastrointestinal: Positive for constipation. Negative for nausea and vomiting.  Musculoskeletal: Positive for back pain.  Skin: Negative for rash.    Neurological: Positive for sensory change and weakness. Negative for seizures.  All other systems reviewed and are negative.      Past Medical History:  Diagnosis Date  . Arthritis   . Hypertension   . Neuromuscular disorder (HCC)    lumbar spinal stenosis          Past Surgical History:  Procedure Laterality Date  . BACK SURGERY  12/26/2014   laminectomy L3-4 and L4-5  . LUMBAR LAMINECTOMY/DECOMPRESSION MICRODISCECTOMY Bilateral 01/21/2015   Procedure: LUMBAR THREE-FOUR, LUMBAR FOUR-FIVE LUMBAR LAMINECTOMY/DECOMPRESSION MICRODISCECTOMY ;  Surgeon: Tressie Stalker, MD;  Location: [facility];  Service: Neurosurgery;  Laterality: Bilateral;  L34 L45 laminectomies and foraminotomies  . LUMBAR LAMINECTOMY/DECOMPRESSION MICRODISCECTOMY N/A 06/07/2016   Procedure: LUMBAR LAMINECTOMY/DECOMPRESSION MICRODISCECTOMY, LUMBAR FOUR - FIVE;  Surgeon: Tressie Stalker, MD;  Location: [facility] OR;  Service: Neurosurgery;  Laterality: N/A;  . none  [id number]  . TONSILLECTOMY     as a child   . WISDOM TOOTH EXTRACTION     /w bone graft - also readying for implants          Family History  Problem Relation Age of Onset  . Hypertension Mother   . CAD Mother 42  . Diabetes Mother   . Cancer Brother     nonhodgkin lymphoma   Social History:  reports that he quit smoking about 14 years ago. He has never used smokeless tobacco. He reports that he drinks alcohol. He reports that he does not use drugs. Allergies:       Allergies  Allergen Reactions  . Hydrochlorothiazide Other (See Comments)    Severe cramping         Medications Prior to Admission  Medication  Sig Dispense Refill  . cyclobenzaprine (FLEXERIL) 10 MG tablet Take 10 mg by mouth 3 (three) times daily as needed for muscle spasms.    Marland Kitchen. gabapentin (NEURONTIN) 300 MG capsule Take 300 mg by mouth 3 (three) times daily.    . Multiple Vitamins-Minerals (MULTIVITAMIN ADULT PO) Take 1 tablet by mouth daily.      Marland Kitchen. oxyCODONE-acetaminophen (PERCOCET) 10-325 MG tablet Take 0.5 tablets by mouth daily as needed for pain.    . predniSONE (DELTASONE) 10 MG tablet Take 6 on day 1, 5 on day 2, 4 on day 3, 3 on day 4, 2 on day 5 and 1 on day 1 then stop. (Patient not taking: Reported on 06/07/2016) 21 tablet 0    Home: Home Living Family/patient expects to be discharged to:: Private residence Living Arrangements: Spouse/significant other Available Help at Discharge: Family Type of Home: House Home Access: Stairs to enter Secretary/administratorntrance Stairs-Number of Steps: 4-5 Entrance Stairs-Rails: Right Home Layout: Two level, 1/2 bath on main level Alternate Level Stairs-Number of Steps: flight Alternate Level Stairs-Rails: Left Bathroom Shower/Tub: Health visitorWalk-in shower Bathroom Toilet: Standard Home Equipment: None  Functional History: Prior Function Level of Independence: Independent Comments: completely independent and working full time as a Visual merchandiserplant manager Functional Status:  Mobility:  ADL:  Cognition: Cognition Overall Cognitive Status: Within Functional Limits for tasks assessed Orientation Level: Oriented X4 Cognition Arousal/Alertness: Awake/alert Behavior During Therapy: WFL for tasks assessed/performed Overall Cognitive Status: Within Functional Limits for tasks assessed  Blood pressure (!) 140/91, pulse 94, temperature 97.8 F (36.6 C), temperature source Oral, resp. rate 18, height 6\' 1"  (1.854 m), weight 107.8 kg (237 lb 10.5 oz), SpO2 100 %. Physical Exam  Vitals reviewed. Constitutional: He is oriented to person, place, and time. He appears well-developed.  HENT:  Head: Normocephalic.  Eyes: EOM are normal.  Neck: Normal range of motion. Neck supple. No thyromegaly present.  Cardiovascular: Normal rate, regular rhythm and normal heart sounds.   Respiratory: Effort normal and breath sounds normal. No respiratory distress.  GI: Soft. Bowel sounds are normal. He exhibits no distension.   Neurological: He is alert and oriented to person, place, and time.  Skin:  Back incision is dressed  Motor strength is 5/5 bilateral deltoid, biceps, triceps, grip 5/5 bilateral hip flexor, knee extensor 4/5, ankle dorsiflexor, trace bilateral ankle plantar flexor. Sensation absent to light touch. Left L5, left S1 and S2, intact right L5 and diminished but present. Right S1, right S2 Absent S2, S3, S4 bilaterally  Lab Results Last 24 Hours       Results for orders placed or performed during the hospital encounter of 06/07/16 (from the past 24 hour(s))  Basic metabolic panel     Status: None   Collection Time: 06/07/16  4:19 PM  Result Value Ref Range   Sodium 140 135 - 145 mmol/L   Potassium 3.9 3.5 - 5.1 mmol/L   Chloride 103 101 - 111 mmol/L   CO2 26 22 - 32 mmol/L   Glucose, Bld 87 65 - 99 mg/dL   BUN 9 6 - 20 mg/dL   Creatinine, Ser 4.090.94 0.61 - 1.24 mg/dL   Calcium 9.8 8.9 - 81.110.3 mg/dL   GFR calc non Af Amer >60 >60 mL/min   GFR calc Af Amer >60 >60 mL/min   Anion gap 11 5 - 15  CBC with Differential     Status: None   Collection Time: 06/07/16  4:19 PM  Result Value Ref Range   WBC  9.3 4.0 - 10.5 K/uL   RBC 4.80 4.22 - 5.81 MIL/uL   Hemoglobin 14.2 13.0 - 17.0 g/dL   HCT 16.1 09.6 - 04.5 %   MCV 86.3 78.0 - 100.0 fL   MCH 29.6 26.0 - 34.0 pg   MCHC 34.3 30.0 - 36.0 g/dL   RDW 40.9 81.1 - 91.4 %   Platelets 236 150 - 400 K/uL   Neutrophils Relative % 74 %   Neutro Abs 6.9 1.7 - 7.7 K/uL   Lymphocytes Relative 20 %   Lymphs Abs 1.9 0.7 - 4.0 K/uL   Monocytes Relative 6 %   Monocytes Absolute 0.5 0.1 - 1.0 K/uL   Eosinophils Relative 0 %   Eosinophils Absolute 0.0 0.0 - 0.7 K/uL   Basophils Relative 0 %   Basophils Absolute 0.0 0.0 - 0.1 K/uL  Protime-INR     Status: None   Collection Time: 06/07/16  4:19 PM  Result Value Ref Range   Prothrombin Time 14.3 11.4 - 15.2 seconds   INR 1.11   APTT     Status: None   Collection  Time: 06/07/16  4:19 PM  Result Value Ref Range   aPTT 34 24 - 36 seconds  Type and screen [facility]     Status: None   Collection Time: 06/07/16  4:25 PM  Result Value Ref Range   ABO/RH(D) A POS    Antibody Screen NEG    Sample Expiration 06/10/2016   ABO/Rh     Status: None   Collection Time: 06/07/16  4:25 PM  Result Value Ref Range   ABO/RH(D) A POS   Surgical pcr screen     Status: None   Collection Time: 06/07/16  6:12 PM  Result Value Ref Range   MRSA, PCR NEGATIVE NEGATIVE   Staphylococcus aureus NEGATIVE NEGATIVE      Imaging Results (Last 48 hours)  Mr Lumbar Spine W Wo Contrast  Result Date: 06/07/2016 CLINICAL DATA:  Low back pain extending into the lower extremities bilaterally. Numbness and tingling in both lower extremities. Symptoms began while doing yard work 2 weeks ago. Lumbar spine surgery 01/21/2015 EXAM: MRI LUMBAR SPINE WITHOUT AND WITH CONTRAST TECHNIQUE: Multiplanar and multiecho pulse sequences of the lumbar spine were obtained without and with intravenous contrast. CONTRAST:  20mL MULTIHANCE GADOBENATE DIMEGLUMINE 529 MG/ML IV SOLN COMPARISON:  Preoperative MRI 12/09/2014. FINDINGS: Segmentation: 5 non rib-bearing lumbar type vertebral bodies are present. Alignment: Slight retrolisthesis is present at L3-4. AP alignment is otherwise anatomic. Vertebrae: Progressive chronic endplate marrow changes are present at L3-4 in left greater than right at L4-5. Vertebral body heights are preserved. There is some fatty infiltration of the marrow. Conus medullaris: Extends to the L1 level and appears normal. Paraspinal and other soft tissues: Limited imaging of the abdomen is unremarkable. No significant adenopathy is present. Disc levels: L1-2:  Negative. L2-3: Mild disc bulging and facet hypertrophy are present. There is no significant stenosis. L3-4: A broad-based disc protrusion is present. Bilateral laminectomies are noted. No residual  central canal stenosis is present. Moderate foraminal narrowing bilaterally is stable. L4-5: A very large disc extrusion or more likely free fragment occupies the spinal canal at the disc level and posterior to the L4 vertebral body. The disc material measures 2.8 x 1.4 x 1.7 cm. Despite the laminectomies, there is severe central canal stenosis with obliteration of the thecal sac. Moderate foraminal stenosis is present bilaterally. L5-S1: Moderate facet hypertrophy has progressed bilaterally. There is  no focal disc protrusion or stenosis. IMPRESSION: 1. Large disc extrusion or likely free fragment occupying the spinal canal posterior to the L4 vertebral body with severe central canal stenosis. The fragment measures 2.8 x 1.4 x 1.7 cm. 2. Interval laminectomy bilaterally at L3-4 with decompression of the central canal. 3. Residual moderate foraminal stenosis bilaterally at L3-4 and at L4-5. 4. Progressive facet hypertrophy at L5-S1 without significant stenosis. Electronically Signed   By: Marin Roberts M.D.   On: 06/07/2016 15:07   Dg Lumbar Spine 1 View  Result Date: 06/07/2016 CLINICAL DATA:  Intraoperative radiograph for laminectomy and decompression at L4-5. EXAM: LUMBAR SPINE - 1 VIEW COMPARISON:  06/07/2016 lumbar MRI. FINDINGS: Lateral radiograph of the lumbar spine demonstrates a probe at the L3-4 intervertebral disc level. Lumbar degenerative changes are present with disc space narrowing moderate L3-4 and mild at L4 through S1. Lower lumbar facet arthrosis. IMPRESSION: Lateral radiograph of the lumbar spine demonstrates a probe at the L3-4 intervertebral disc level. Electronically Signed   By: Mitzi Hansen M.D.   On: 06/07/2016 21:24     Assessment/Plan: Diagnosis: Cauda equina syndrome with primarily sacral involvement, incomplete, side more involved than right, neurogenic bowel and bladder 1. Does the need for close, 24 hr/day medical supervision in concert with the  patient's rehab needs make it unreasonable for this patient to be served in a less intensive setting? Yes 2. Co-Morbidities requiring supervision/potential complications: Hypertension, lumbar degenerative disc, status post laminectomy, discectomy, postoperative day #1 3. Due to bladder management, bowel management, safety, skin/wound care, disease management, medication administration, pain management and patient education, does the patient require 24 hr/day rehab nursing? Yes 4. Does the patient require coordinated care of a physician, rehab nurse, PT (1-2 hrs/day, 5 days/week) and OT (1-2 hrs/day, 5 days/week) to address physical and functional deficits in the context of the above medical diagnosis(es)? Yes Addressing deficits in the following areas: balance, endurance, locomotion, strength, transferring, bowel/bladder control, bathing, dressing, feeding, grooming, toileting and psychosocial support 5. Can the patient actively participate in an intensive therapy program of at least 3 hrs of therapy per day at least 5 days per week? Yes 6. The potential for patient to make measurable gains while on inpatient rehab is good 7. Anticipated functional outcomes upon discharge from inpatient rehab are modified independent  with PT, modified independent with OT, n/a with SLP. 8. Estimated rehab length of stay to reach the above functional goals is: 7-10d 9. Does the patient have adequate social supports and living environment to accommodate these discharge functional goals? Yes 10. Anticipated D/C setting: Home 11. Anticipated post D/C treatments: HH therapy 12. Overall Rehab/Functional Prognosis: excellent  RECOMMENDATIONS: This patient's condition is appropriate for continued rehabilitative care in the following setting: CIR Patient has agreed to participate in recommended program. Yes Note that insurance prior authorization may be required for reimbursement for recommended care.  Comment: Will need  to work on establishing bowel and bladder program.   Charlton Amor., PA-C 06/08/2016    Revision History                   Routing History

## 2016-06-12 NOTE — Progress Notes (Signed)
Pt admitted to 4W via tammy RN from [facility]. Pt comfortable and in no pain. Continue plan of care.

## 2016-06-12 NOTE — Progress Notes (Signed)
Patient ID: Andrew [name], male   DOB: 1970/04/12, 46 y.o.   MRN: [id number] Subjective:  The patient is alert and pleasant. He is in no apparent distress. He continues to have perineal numbness and the Foley catheter in place.  Objective: Vital signs in last 24 hours: Temp:  [97.3 F (36.3 C)-98.5 F (36.9 C)] 97.3 F (36.3 C) (03/19 0900) Pulse Rate:  [71-94] 85 (03/19 0900) Resp:  [18-20] 20 (03/19 0900) BP: (135-155)/(88-96) 149/96 (03/19 0900) SpO2:  [97 %-100 %] 100 % (03/19 0900)  Intake/Output from previous day: 03/18 0701 - 03/19 0700 In: 720 [P.O.:720] Out: 2300 [Urine:2300] Intake/Output this shift: Total I/O In: -  Out: 1000 [Urine:1000]  Physical exam the patient is alert and pleasant. His wound is healing well. His strength is grossly normal on his bilateral gastrocnemius and dorsiflexors.  Lab Results: No results for input(s): WBC, HGB, HCT, PLT in the last 72 hours. BMET No results for input(s): NA, K, CL, CO2, GLUCOSE, BUN, CREATININE, CALCIUM in the last 72 hours.  Studies/Results: No results found.  Assessment/Plan: Postop day #5, cauda equina syndrome: The patient's strength has improved well over the weekend. Hopefully his perineal numbness and bladder function will improve as well. We are awaiting rehabilitation placement. I have answered all his questions.  LOS: 5 days     Victoriah Wilds D 06/12/2016, 12:16 PM

## 2016-06-12 NOTE — Care Management Note (Signed)
Case Management Note  Patient Details  Name: Andrew [name] MRN: [id number] Date of Birth: 08/12/1970  Subjective/Objective:                    Action/Plan: Pt discharging to CIR today. No further needs per CM.   Expected Discharge Date:  06/12/16               Expected Discharge Plan:  IP Rehab Facility  In-House Referral:     Discharge planning Services     Post Acute Care Choice:    Choice offered to:     DME Arranged:    DME Agency:     HH Arranged:    HH Agency:     Status of Service:  Completed, signed off  If discussed at [employer], dates discussed:    Additional Comments:  Kermit Balo, RN 06/12/2016, 1:25 PM

## 2016-06-12 NOTE — Progress Notes (Signed)
Retta Diones, RN Rehab Admission Coordinator Signed Physical Medicine and Rehabilitation  PMR Pre-admission Date of Service: 06/12/2016 1:13 PM  Related encounter: ED to Hosp-Admission (Current) from 06/07/2016 in [place]       _0 Hide copied text PMR Admission Coordinator Pre-Admission Assessment  Patient: Andrew [name] is an 46 y.o., male MRN: [id number] DOB: [birth year] Height: _1  (185.4 cm) Weight: 107.8 kg (237 lb 10.5 oz)                                                                                                                                      Insurance Information HMO:     PPO:  Yes     PCP:       IPA:       80/20:       OTHER:  Group # [id number] PRIMARY:  BCBS      Policy#:  [id number]      Subscriber:  Andrew [name] CM Name: Tammy sheppard      Phone#: [phone]     Fax#:  [phone] Pre-Cert#:  [id number] from 06/12/16 to 06/26/16 with updates due 06/26/16       Employer:  [employer] Benefits:  Phone #: [phone]     Name:  Online Eff. Date: 05/25/16     Deduct:  $1000 (met $0)      Out of Pocket Max: $4000 (met $251.13      Life Max: unlimited CIR: 80% with auth      SNF: 80% with 60 days limit Outpatient: 80% with 30 visits each PT/OT/ST - has used 5 PT/OT     Co-Pay: 20% Home Health: 80% w/quth      Co-Pay: 20% DME: 80%     Co-Pay: 20% Providers:  In network  Medicaid Application Date:        Case Manager:   Disability Application Date:        Case Worker:    Emergency Minkler    Name Relation Home Work Mobile   [place] Spouse [phone]       Current Medical History  Patient Admitting Diagnosis: Cauda equina syndrome with primarily sacral involvement, incomplete, side more involved than right, neurogenic bowel and bladder  History of Present Illness: A 46 y.o.right handed malewith history of hypertension as well as recent L3-4 and 4-5 laminectomy October  2016.Per chart review patient independent prior to admission up into the past 2 weeks. 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 Two-level home with bedroom upstairs.Patient was doing well until he returned back to neurosurgery office with complaints of increasing pain as well as acute onset of bilateral leg numbness and weaknessand decreased sensation to void. MRI imaging revealed a huge herniated disc at L4-5. Underwent L4 laminectomy with L4-5 discectomy using microdissection 06/07/2016 per  Dr. Arnoldo Morale.Routine back precautions with no brace required.Hospital course pain management.Maintain on a Medrol dose pack 06/11/2016 10 doses. Ongoing urinary retention and a Foley catheter tube had been in place.Physical and occupational therapy evaluations Completed with recommendations of physical medicine rehabilitation consult. Patient was admitted for a comprehensive rehabilitation program.    Past Medical History      Past Medical History:  Diagnosis Date  . Arthritis   . Hypertension   . Neuromuscular disorder ([place])    lumbar spinal stenosis      Family History  family history includes CAD (age of onset: 57) in his mother; Cancer in his brother; Diabetes in his mother; Hypertension in his mother.  Prior Rehab/Hospitalizations: No previous rehab admissions.  Has the patient had major surgery during 100 days prior to admission? No  Current Medications   Current Facility-Administered Medications:  .  0.9 %  sodium chloride infusion, 250 mL, Intravenous, Continuous, Newman Pies, MD .  acetaminophen (TYLENOL) tablet 650 mg, 650 mg, Oral, Q4H PRN, 650 mg at 06/08/16 1002 **OR** acetaminophen (TYLENOL) suppository 650 mg, 650 mg, Rectal, Q4H PRN, Newman Pies, MD .  bisacodyl (DULCOLAX) suppository 10 mg, 10 mg, Rectal, Daily PRN, Newman Pies, MD .  cyclobenzaprine (FLEXERIL) tablet 10 mg, 10 mg, Oral, TID PRN, Newman Pies, MD, 10 mg at  06/09/16 0109 .  docusate sodium (COLACE) capsule 100 mg, 100 mg, Oral, BID, Newman Pies, MD, 100 mg at 06/12/16 0950 .  gabapentin (NEURONTIN) capsule 300 mg, 300 mg, Oral, TID, Newman Pies, MD, 300 mg at 06/12/16 0950 .  lactated ringers infusion, , Intravenous, Continuous, Newman Pies, MD, Last Rate: 10 mL/hr at 06/07/16 1813 .  menthol-cetylpyridinium (CEPACOL) lozenge 3 mg, 1 lozenge, Oral, PRN **OR** phenol (CHLORASEPTIC) mouth spray 1 spray, 1 spray, Mouth/Throat, PRN, Newman Pies, MD .  methylPREDNISolone (MEDROL DOSEPAK) tablet 4 mg, 4 mg, Oral, 4X daily taper, Kary Kos, MD, 4 mg at 06/12/16 0850 .  morphine 4 MG/ML injection 4 mg, 4 mg, Intravenous, Q2H PRN, Newman Pies, MD, 4 mg at 06/08/16 0005 .  multivitamin with minerals tablet 1 tablet, 1 tablet, Oral, Daily, Newman Pies, MD, 1 tablet at 06/12/16 0950 .  ondansetron (ZOFRAN) tablet 4 mg, 4 mg, Oral, Q6H PRN **OR** ondansetron (ZOFRAN) injection 4 mg, 4 mg, Intravenous, Q6H PRN, Newman Pies, MD .  oxyCODONE (Oxy IR/ROXICODONE) immediate release tablet 5-10 mg, 5-10 mg, Oral, Q3H PRN, Newman Pies, MD, 5 mg at 06/09/16 0109 .  sodium chloride flush (NS) 0.9 % injection 3 mL, 3 mL, Intravenous, Q12H, Newman Pies, MD, 3 mL at 06/12/16 0951 .  sodium chloride flush (NS) 0.9 % injection 3 mL, 3 mL, Intravenous, PRN, Newman Pies, MD  Patients Current Diet: Diet regular Room service appropriate? Yes; Fluid consistency: Thin  Precautions / Restrictions Precautions Precautions: Back Precaution Booklet Issued: Yes (comment) Precaution Comments: hand out given and reviewed Spinal Brace: Lumbar corset Restrictions Weight Bearing Restrictions: No   Has the patient had 2 or more falls or a fall with injury in the past year?No  Prior Activity Level Community (5-7x/wk): Went out daily, worked [employer] as a Engineer, building services.  Home Assistive Devices / Equipment Home Assistive Devices/Equipment:  None Home Equipment: None  Prior Device Use: Indicate devices/aids used by the patient prior to current illness, exacerbation or injury? None  Prior Functional Level Prior Function Level of Independence: Independent Comments: completely independent and working full time as a Engineer, building services; has 2 young daughters; enjoys riding motorcycles  Self Care: Did the patient need help bathing, dressing, using the toilet or eating?  Independent  Indoor Mobility: Did the patient need assistance with walking from room to room (with or without device)? Independent  Stairs: Did the patient need assistance with internal or external stairs (with or without device)? Independent  Functional Cognition: Did the patient need help planning regular tasks such as shopping or remembering to take medications? Independent  Current Functional Level Cognition  Overall Cognitive Status: Within Functional Limits for tasks assessed Orientation Level: Oriented X4    Extremity Assessment (includes Sensation/Coordination)  Upper Extremity Assessment: Defer to OT evaluation  Lower Extremity Assessment: RLE deficits/detail, LLE deficits/detail RLE Deficits / Details: grossly 4/5, decreased ability to perform DF with 3/5, 3/5 plantarflexion. RLE Sensation: decreased light touch LLE Deficits / Details: gossly 3+/5 hip and knee. 3/5 ankle DF/PF, decreased great toe extension  LLE Sensation: decreased light touch (lateral foot)    ADLs  Overall ADL's : Needs assistance/impaired Grooming: Set up Upper Body Bathing: Set up, Sitting Lower Body Bathing: Moderate assistance, Sit to/from stand Upper Body Dressing : Set up, Supervision/safety, Sitting Lower Body Dressing: Sit to/from stand, Maximal assistance Toilet Transfer: Minimal assistance, Ambulation, Cueing for safety, RW, BSC Toileting- Clothing Manipulation and Hygiene: Maximal assistance Toileting - Clothing Manipulation Details (indicate cue type and  reason): pt. reports that performing hygiene after BM is difficult due to loss of sensation.  states physically he can reach without breaking precautions but he can not feel where he is reaching.   Tub/ Shower Transfer: Gaffer, Grab bars, Conservation officer, nature, Engineer, agricultural Details (indicate cue type and reason): reviewed technique and strategy for stepping over ledge, remains a challenge due to loss of sensation with B LEs Functional mobility during ADLs: Minimal assistance, Rolling walker, Cueing for safety General ADL Comments: unable to cross ankles over knees at this time, will benefit from continued work with compensatory strategies for LB ADLS.     Mobility  Overal bed mobility: Needs Assistance Bed Mobility: Rolling, Sidelying to Sit Rolling: Supervision Sidelying to sit: Supervision Sit to sidelying: Min guard General bed mobility comments: up in chair    Transfers  Overall transfer level: Needs assistance Equipment used: Rolling walker (2 wheeled) Transfers: Sit to/from Stand Sit to Stand: Min guard Stand pivot transfers: Min assist General transfer comment: heavy UE reliance for sit to stand with minguard for balance especially transferring hands to walker from armrests on chair    Ambulation / Gait / Stairs / Wheelchair Mobility  Ambulation/Gait Ambulation/Gait assistance: Min assist Ambulation Distance (Feet): 130 Feet (x 3 with seated rest x 2) Assistive device: Rolling walker (2 wheeled) Gait Pattern/deviations: Step-through pattern, Decreased stride length, Trendelenburg, Wide base of support General Gait Details: wore shoes during gait for improved stability in ankles/arches; demonstrated increased tibialis anterior activation and heel strike, but continues with knee buckling during loading with popping back into extension in mid stance, then with lateral hip instability during single limb stance phase.  Ambulated short segments with wall railing  and pt demonstrated forward trunk flexion during loading on side without UE support needing min A and cues for hip extension in stance. Gait velocity: decreased Gait velocity interpretation: Below normal speed for age/gender Stairs: Yes Stairs assistance: Mod assist Stair Management: Two rails, Step to pattern, Forwards Number of Stairs: 2 General stair comments: assist for steadying, cues/demo for technique, c/o pain in back with descent    Posture / Balance Balance Overall balance assessment: Needs  assistance Sitting-balance support: No upper extremity supported, Feet supported Sitting balance-Leahy Scale: Good Standing balance support: Bilateral upper extremity supported, Single extremity supported Standing balance-Leahy Scale: Poor Standing balance comment: needs at least single UE support  High Level Balance Comments: ant/post rocking with bilateral vs unilateral UE support (unable to come up on toes, but did weight shift over forefoot with moving hips forward; cues for improved upright stance during practice with limits of stability    Special needs/care consideration BiPAP/CPAP No CPM No Continuous Drip IV No Dialysis No       Life Vest No Oxygen No Special Bed No Trach Size No Wound Vac (area) No      Skin No, however has no sensitivity in pelvic region and down his legs                            Bowel mgmt: Last documented BM 06/10/16 but patient reports BM 06/12/16 Bladder mgmt: Foley catheter Diabetic mgmt No     Previous Home Environment Living Arrangements: Spouse/significant other Available Help at Discharge: Family Type of Home: House Home Layout: Two level, 1/2 bath on main level, Bed/bath upstairs Alternate Level Stairs-Rails: Left Alternate Level Stairs-Number of Steps: flight Home Access: Stairs to enter Entrance Stairs-Rails: Right Entrance Stairs-Number of Steps: 4-5 Bathroom Shower/Tub: Multimedia programmer: Standard Bathroom  Accessibility: Yes How Accessible: Accessible via walker Home Care Services: No  Discharge Living Setting Plans for Discharge Living Setting: Patient's home, House, Lives with (comment) (Lives with wife and 2 daughters.) Type of Home at Discharge: House Discharge Home Layout: Two level, 1/2 bath on main level, Bed/bath upstairs Alternate Level Stairs-Number of Steps: 17 steps Discharge Home Access: Stairs to enter Entrance Stairs-Number of Steps: 4 at garage entry and 5 at front entry Does the patient have any problems obtaining your medications?: No  Social/Family/Support Systems Patient Roles: Spouse, Parent (Has a wife and daughters 76 and 44 yo.) Contact Information: drayven marchena [phone] Anticipated Caregiver: self and wife Ability/Limitations of Caregiver: Wife works 8 am to 1 pm Caregiver Availability: Intermittent Discharge Plan Discussed with Primary Caregiver: Yes Is Caregiver In Agreement with Plan?: Yes Does Caregiver/Family have Issues with Lodging/Transportation while Pt is in Rehab?: No   Goals/Additional Needs Patient/Family Goal for Rehab:   Expected length of stay: 7-10 days Cultural Considerations: From [place], [place] 14 [place], speaks English very well. Dietary Needs: Regular diet, thin liquids Equipment Needs: TBD Pt/Family Agrees to Admission and willing to participate: Yes Program Orientation Provided & Reviewed with Pt/Caregiver Including Roles  & Responsibilities: Yes   Decrease burden of Care through IP rehab admission: N/A  Possible need for SNF placement upon discharge: Not anticipated  Patient Condition: This patient's medical and functional status has changed since the consult dated: 06/08/16 in which the Rehabilitation Physician determined and documented that the patient's condition is appropriate for intensive rehabilitative care in an inpatient rehabilitation facility. See "History of Present Illness" (above) for  medical update. Functional changes are:  Currently requiring min assist to ambulate 130 feet RW. Patient's medical and functional status update has been discussed with the Rehabilitation physician and patient remains appropriate for inpatient rehabilitation. Will admit to inpatient rehab today.  Preadmission Screen Completed By:  Retta Diones, 06/12/2016 1:33 PM ______________________________________________________________________   Discussed status with Dr. Posey Pronto on 06/12/16 at 1332 and received telephone approval for admission today.  Admission Coordinator:  Retta Diones,  time 1332 /Date 06/12/16       Cosigned by: Ankit Lorie Phenix, MD at 06/12/2016 1:36 PM  Revision History

## 2016-06-12 NOTE — PMR Pre-admission (Signed)
PMR Admission Coordinator Pre-Admission Assessment  Patient: Andrew [name] is an 46 y.o., male MRN: [id number] DOB: [birth year] Height: '6\' 1"'  (185.4 cm) Weight: 107.8 kg (237 lb 10.5 oz)             Insurance Information HMO:     PPO:  Yes     PCP:       IPA:       80/20:       OTHER:  Group # [id number] PRIMARY:  BCBS      Policy#:  [id number]      Subscriber:  Andrew [name] CM Name: Tammy sheppard      Phone#: [phone]     Fax#:  [phone] Pre-Cert#:  [id number] from 06/12/16 to 06/26/16 with updates due 06/26/16       Employer:  [employer] Benefits:  Phone #: [phone]     Name:  Online Eff. Date: 05/25/16     Deduct:  $1000 (met $0)      Out of Pocket Max: $4000 (met $251.13      Life Max: unlimited CIR: 80% with auth      SNF: 80% with 60 days limit Outpatient: 80% with 30 visits each PT/OT/ST - has used 5 PT/OT     Co-Pay: 20% Home Health: 80% w/quth      Co-Pay: 20% DME: 80%     Co-Pay: 20% Providers:  In network  Medicaid Application Date:        Case Manager:   Disability Application Date:        Case Worker:    Emergency [place]    Name Relation Home Work Mobile   [place] Spouse [phone]       Current Medical History  Patient Admitting Diagnosis: Cauda equina syndrome with primarily sacral involvement, incomplete, side more involved than right, neurogenic bowel and bladder  History of Present Illness: A 46 y.o.right handed malewith history of hypertension as well as recent L3-4 and 4-5 laminectomy October 2016.Per chart review patient independent prior to admission up into the past 2 weeks. 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 Two-level home with bedroom upstairs.Patient was doing well until he returned back to neurosurgery office with complaints of increasing pain as well as acute onset of bilateral leg numbness and weaknessand decreased sensation to void. MRI imaging revealed a huge herniated disc  at L4-5. Underwent L4 laminectomy with L4-5 discectomy using microdissection 06/07/2016 per Dr. Arnoldo Morale.Routine back precautions with no brace required. Hospital course pain management.Maintain on a Medrol dose pack 06/11/2016 10 doses. Ongoing urinary retention and a Foley catheter tube had been in place. Physical and occupational therapy evaluations Completed with recommendations of physical medicine rehabilitation consult. Patient was admitted for a comprehensive rehabilitation program.    Past Medical History  Past Medical History:  Diagnosis Date  . Arthritis   . Hypertension   . Neuromuscular disorder ([place])    lumbar spinal stenosis      Family History  family history includes CAD (age of onset: 55) in his mother; Cancer in his brother; Diabetes in his mother; Hypertension in his mother.  Prior Rehab/Hospitalizations: No previous rehab admissions.  Has the patient had major surgery during 100 days prior to admission? No  Current Medications   Current Facility-Administered Medications:  .  0.9 %  sodium chloride infusion, 250 mL, Intravenous, Continuous, Newman Pies, MD .  acetaminophen (TYLENOL) tablet 650 mg, 650 mg, Oral, Q4H PRN, 650 mg at  06/08/16 1002 **OR** acetaminophen (TYLENOL) suppository 650 mg, 650 mg, Rectal, Q4H PRN, Newman Pies, MD .  bisacodyl (DULCOLAX) suppository 10 mg, 10 mg, Rectal, Daily PRN, Newman Pies, MD .  cyclobenzaprine (FLEXERIL) tablet 10 mg, 10 mg, Oral, TID PRN, Newman Pies, MD, 10 mg at 06/09/16 0109 .  docusate sodium (COLACE) capsule 100 mg, 100 mg, Oral, BID, Newman Pies, MD, 100 mg at 06/12/16 0950 .  gabapentin (NEURONTIN) capsule 300 mg, 300 mg, Oral, TID, Newman Pies, MD, 300 mg at 06/12/16 0950 .  lactated ringers infusion, , Intravenous, Continuous, Newman Pies, MD, Last Rate: 10 mL/hr at 06/07/16 1813 .  menthol-cetylpyridinium (CEPACOL) lozenge 3 mg, 1 lozenge, Oral, PRN **OR** phenol (CHLORASEPTIC) mouth  spray 1 spray, 1 spray, Mouth/Throat, PRN, Newman Pies, MD .  methylPREDNISolone (MEDROL DOSEPAK) tablet 4 mg, 4 mg, Oral, 4X daily taper, Kary Kos, MD, 4 mg at 06/12/16 0850 .  morphine 4 MG/ML injection 4 mg, 4 mg, Intravenous, Q2H PRN, Newman Pies, MD, 4 mg at 06/08/16 0005 .  multivitamin with minerals tablet 1 tablet, 1 tablet, Oral, Daily, Newman Pies, MD, 1 tablet at 06/12/16 0950 .  ondansetron (ZOFRAN) tablet 4 mg, 4 mg, Oral, Q6H PRN **OR** ondansetron (ZOFRAN) injection 4 mg, 4 mg, Intravenous, Q6H PRN, Newman Pies, MD .  oxyCODONE (Oxy IR/ROXICODONE) immediate release tablet 5-10 mg, 5-10 mg, Oral, Q3H PRN, Newman Pies, MD, 5 mg at 06/09/16 0109 .  sodium chloride flush (NS) 0.9 % injection 3 mL, 3 mL, Intravenous, Q12H, Newman Pies, MD, 3 mL at 06/12/16 0951 .  sodium chloride flush (NS) 0.9 % injection 3 mL, 3 mL, Intravenous, PRN, Newman Pies, MD  Patients Current Diet: Diet regular Room service appropriate? Yes; Fluid consistency: Thin  Precautions / Restrictions Precautions Precautions: Back Precaution Booklet Issued: Yes (comment) Precaution Comments: hand out given and reviewed Spinal Brace: Lumbar corset Restrictions Weight Bearing Restrictions: No   Has the patient had 2 or more falls or a fall with injury in the past year?No  Prior Activity Level Community (5-7x/wk): Went out daily, worked [employer] as a Engineer, building services.  Home Assistive Devices / Equipment Home Assistive Devices/Equipment: None Home Equipment: None  Prior Device Use: Indicate devices/aids used by the patient prior to current illness, exacerbation or injury? None  Prior Functional Level Prior Function Level of Independence: Independent Comments: completely independent and working full time as a Engineer, building services; has 2 young daughters; enjoys riding motorcycles  Self Care: Did the patient need help bathing, dressing, using the toilet or eating?  Independent  Indoor Mobility:  Did the patient need assistance with walking from room to room (with or without device)? Independent  Stairs: Did the patient need assistance with internal or external stairs (with or without device)? Independent  Functional Cognition: Did the patient need help planning regular tasks such as shopping or remembering to take medications? Independent  Current Functional Level Cognition  Overall Cognitive Status: Within Functional Limits for tasks assessed Orientation Level: Oriented X4    Extremity Assessment (includes Sensation/Coordination)  Upper Extremity Assessment: Defer to OT evaluation  Lower Extremity Assessment: RLE deficits/detail, LLE deficits/detail RLE Deficits / Details: grossly 4/5, decreased ability to perform DF with 3/5, 3/5 plantarflexion. RLE Sensation: decreased light touch LLE Deficits / Details: gossly 3+/5 hip and knee. 3/5 ankle DF/PF, decreased great toe extension  LLE Sensation: decreased light touch (lateral foot)    ADLs  Overall ADL's : Needs assistance/impaired Grooming: Set up Upper Body Bathing: Set up, Sitting  Lower Body Bathing: Moderate assistance, Sit to/from stand Upper Body Dressing : Set up, Supervision/safety, Sitting Lower Body Dressing: Sit to/from stand, Maximal assistance Toilet Transfer: Minimal assistance, Ambulation, Cueing for safety, RW, BSC Toileting- Clothing Manipulation and Hygiene: Maximal assistance Toileting - Clothing Manipulation Details (indicate cue type and reason): pt. reports that performing hygiene after BM is difficult due to loss of sensation.  states physically he can reach without breaking precautions but he can not feel where he is reaching.   Tub/ Shower Transfer: Gaffer, Grab bars, Conservation officer, nature, Engineer, agricultural Details (indicate cue type and reason): reviewed technique and strategy for stepping over ledge, remains a challenge due to loss of sensation with B LEs Functional mobility during  ADLs: Minimal assistance, Rolling walker, Cueing for safety General ADL Comments: unable to cross ankles over knees at this time, will benefit from continued work with compensatory strategies for LB ADLS.     Mobility  Overal bed mobility: Needs Assistance Bed Mobility: Rolling, Sidelying to Sit Rolling: Supervision Sidelying to sit: Supervision Sit to sidelying: Min guard General bed mobility comments: up in chair    Transfers  Overall transfer level: Needs assistance Equipment used: Rolling walker (2 wheeled) Transfers: Sit to/from Stand Sit to Stand: Min guard Stand pivot transfers: Min assist General transfer comment: heavy UE reliance for sit to stand with minguard for balance especially transferring hands to walker from armrests on chair    Ambulation / Gait / Stairs / Wheelchair Mobility  Ambulation/Gait Ambulation/Gait assistance: Min assist Ambulation Distance (Feet): 130 Feet (x 3 with seated rest x 2) Assistive device: Rolling walker (2 wheeled) Gait Pattern/deviations: Step-through pattern, Decreased stride length, Trendelenburg, Wide base of support General Gait Details: wore shoes during gait for improved stability in ankles/arches; demonstrated increased tibialis anterior activation and heel strike, but continues with knee buckling during loading with popping back into extension in mid stance, then with lateral hip instability during single limb stance phase.  Ambulated short segments with wall railing and pt demonstrated forward trunk flexion during loading on side without UE support needing min A and cues for hip extension in stance. Gait velocity: decreased Gait velocity interpretation: Below normal speed for age/gender Stairs: Yes Stairs assistance: Mod assist Stair Management: Two rails, Step to pattern, Forwards Number of Stairs: 2 General stair comments: assist for steadying, cues/demo for technique, c/o pain in back with descent    Posture / Balance  Balance Overall balance assessment: Needs assistance Sitting-balance support: No upper extremity supported, Feet supported Sitting balance-Leahy Scale: Good Standing balance support: Bilateral upper extremity supported, Single extremity supported Standing balance-Leahy Scale: Poor Standing balance comment: needs at least single UE support  High Level Balance Comments: ant/post rocking with bilateral vs unilateral UE support (unable to come up on toes, but did weight shift over forefoot with moving hips forward; cues for improved upright stance during practice with limits of stability    Special needs/care consideration BiPAP/CPAP No CPM No Continuous Drip IV No Dialysis No       Life Vest No Oxygen No Special Bed No Trach Size No Wound Vac (area) No      Skin No, however has no sensitivity in pelvic region and down his legs                            Bowel mgmt: Last documented BM 06/10/16 but patient reports BM 06/12/16 Bladder mgmt: Foley catheter Diabetic mgmt No  Previous Home Environment Living Arrangements: Spouse/significant other Available Help at Discharge: Family Type of Home: House Home Layout: Two level, 1/2 bath on main level, Bed/bath upstairs Alternate Level Stairs-Rails: Left Alternate Level Stairs-Number of Steps: flight Home Access: Stairs to enter Entrance Stairs-Rails: Right Entrance Stairs-Number of Steps: 4-5 Bathroom Shower/Tub: Multimedia programmer: Standard Bathroom Accessibility: Yes How Accessible: Accessible via walker Mc[place]osh: No  Discharge Living Setting Plans for Discharge Living Setting: Patient's home, House, Lives with (comment) (Lives with wife and 2 daughters.) Type of Home at Discharge: House Discharge Home Layout: Two level, 1/2 bath on main level, Bed/bath upstairs Alternate Level Stairs-Number of Steps: 17 steps Discharge Home Access: Stairs to enter Entrance Stairs-Number of Steps: 4 at garage entry and 5 at  front entry Does the patient have any problems obtaining your medications?: No  Social/Family/Support Systems Patient Roles: Spouse, Parent (Has a wife and daughters 45 and 61 yo.) Contact Information: [place] [phone] Anticipated Caregiver: self and wife Ability/Limitations of Caregiver: Wife works 8 am to 1 pm Caregiver Availability: Intermittent Discharge Plan Discussed with Primary Caregiver: Yes Is Caregiver In Agreement with Plan?: Yes Does Caregiver/Family have Issues with Lodging/Transportation while Pt is in Rehab?: No   Goals/Additional Needs Patient/Family Goal for Rehab:   Expected length of stay: 7-10 days Cultural Considerations: From [place], has been in the states 14 yrs, speaks English very well. Dietary Needs: Regular diet, thin liquids Equipment Needs: TBD Pt/Family Agrees to Admission and willing to participate: Yes Program Orientation Provided & Reviewed with Pt/Caregiver Including Roles  & Responsibilities: Yes   Decrease burden of Care through IP rehab admission: N/A  Possible need for SNF placement upon discharge: Not anticipated  Patient Condition: This patient's medical and functional status has changed since the consult dated: 06/08/16 in which the Rehabilitation Physician determined and documented that the patient's condition is appropriate for intensive rehabilitative care in an inpatient rehabilitation facility. See "History of Present Illness" (above) for medical update. Functional changes are:  Currently requiring min assist to ambulate 130 feet RW. Patient's medical and functional status update has been discussed with the Rehabilitation physician and patient remains appropriate for inpatient rehabilitation. Will admit to inpatient rehab today.  Preadmission Screen Completed By:  Retta Diones, 06/12/2016 1:33 PM ______________________________________________________________________   Discussed status with Dr. Posey Pronto on 06/12/16 at 1332 and  received telephone approval for admission today.  Admission Coordinator:  Retta Diones, time 1332 Sudie Grumbling 06/12/16

## 2016-06-13 ENCOUNTER — Inpatient Hospital Stay (HOSPITAL_COMMUNITY): Payer: BLUE CROSS/BLUE SHIELD | Admitting: Physical Therapy

## 2016-06-13 ENCOUNTER — Inpatient Hospital Stay (HOSPITAL_COMMUNITY): Payer: BLUE CROSS/BLUE SHIELD

## 2016-06-13 ENCOUNTER — Inpatient Hospital Stay (HOSPITAL_COMMUNITY): Payer: BLUE CROSS/BLUE SHIELD | Admitting: Occupational Therapy

## 2016-06-13 DIAGNOSIS — M7989 Other specified soft tissue disorders: Secondary | ICD-10-CM

## 2016-06-13 DIAGNOSIS — N319 Neuromuscular dysfunction of bladder, unspecified: Secondary | ICD-10-CM

## 2016-06-13 DIAGNOSIS — G834 Cauda equina syndrome: Secondary | ICD-10-CM

## 2016-06-13 LAB — CBC WITH DIFFERENTIAL/PLATELET
Basophils Absolute: 0 10*3/uL (ref 0.0–0.1)
Basophils Relative: 0 %
Eosinophils Absolute: 0.2 10*3/uL (ref 0.0–0.7)
Eosinophils Relative: 2 %
HCT: 45.7 % (ref 39.0–52.0)
Hemoglobin: 15.6 g/dL (ref 13.0–17.0)
Lymphocytes Relative: 31 %
Lymphs Abs: 3 10*3/uL (ref 0.7–4.0)
MCH: 30.3 pg (ref 26.0–34.0)
MCHC: 34.1 g/dL (ref 30.0–36.0)
MCV: 88.7 fL (ref 78.0–100.0)
Monocytes Absolute: 0.8 10*3/uL (ref 0.1–1.0)
Monocytes Relative: 8 %
Neutro Abs: 5.8 10*3/uL (ref 1.7–7.7)
Neutrophils Relative %: 59 %
Platelets: 301 10*3/uL (ref 150–400)
RBC: 5.15 MIL/uL (ref 4.22–5.81)
RDW: 13.1 % (ref 11.5–15.5)
WBC: 9.8 10*3/uL (ref 4.0–10.5)

## 2016-06-13 LAB — COMPREHENSIVE METABOLIC PANEL
ALT: 41 U/L (ref 17–63)
AST: 31 U/L (ref 15–41)
Albumin: 4 g/dL (ref 3.5–5.0)
Alkaline Phosphatase: 47 U/L (ref 38–126)
Anion gap: 10 (ref 5–15)
BUN: 17 mg/dL (ref 6–20)
CO2: 32 mmol/L (ref 22–32)
Calcium: 10.1 mg/dL (ref 8.9–10.3)
Chloride: 98 mmol/L — ABNORMAL LOW (ref 101–111)
Creatinine, Ser: 0.96 mg/dL (ref 0.61–1.24)
GFR calc Af Amer: >60 mL/min (ref >60)
GFR calc non Af Amer: >60 mL/min (ref >60)
Glucose, Bld: 90 mg/dL (ref 65–99)
Potassium: 4.6 mmol/L (ref 3.5–5.1)
Sodium: 140 mmol/L (ref 135–145)
Total Bilirubin: 0.8 mg/dL (ref 0.3–1.2)
Total Protein: 7.1 g/dL (ref 6.5–8.1)

## 2016-06-13 MED ORDER — BETHANECHOL CHLORIDE 10 MG PO TABS
10.0000 mg | ORAL_TABLET | Freq: Three times a day (TID) | ORAL | Status: DC
  Administered 2016-06-13 – 2016-06-15 (×7): 10 mg via ORAL
  Filled 2016-06-13 (×7): qty 1

## 2016-06-13 NOTE — Progress Notes (Signed)
[facility]     PROGRESS NOTE    Subjective/Complaints: No issues overnight. Denies pain. Has some sense that he needs to empty his bowels.   ROS: pt denies nausea, vomiting, diarrhea, cough, shortness of breath or chest pain   Objective: Vital Signs: Blood pressure (!) 128/94, pulse 62, temperature 97.4 F (36.3 C), temperature source Oral, resp. rate 20, height 6\' 1"  (1.854 m), weight 102.3 kg (225 lb 8.5 oz), SpO2 97 %. No results found.  Recent Labs  06/13/16 0628  WBC 9.8  HGB 15.6  HCT 45.7  PLT 301    Recent Labs  06/13/16 0628  NA 140  K 4.6  CL 98*  GLUCOSE 90  BUN 17  CREATININE 0.96  CALCIUM 10.1   CBG (last 3)  No results for input(s): GLUCAP in the last 72 hours.  Wt Readings from Last 3 Encounters:  06/12/16 102.3 kg (225 lb 8.5 oz)  06/07/16 107.8 kg (237 lb 10.5 oz)  05/10/16 109.3 kg (241 lb)    Physical Exam:  Constitutional: He is oriented to person, place, and time. He appears well-developed and well-nourished.  HENT:  Head: Normocephalic and atraumatic.  Eyes: EOMI.  Neck: Normal range of motion. Neck supple. No thyromegaly present.  Cardiovascular: RRR.   Respiratory: CTA B.  GI: soft BS+ NT/ND  Musculoskeletal: He exhibits no edema or tenderness.  Neurological: He is alert and oriented to person, place, and time.  Neurological:  Motor: 5/5 bilateral deltoid, biceps, triceps, grip 4+ to 5/5 bilateral hip flexor, knee extensor ankle dorsiflexor, 4-/5 ankle plantar flexion as well as hip abduction Sensation absent to light touch perineal area as well as soles of feet.   Skin: Skin is warm and dry. Back incision clean and dry with sutures  Psychiatric: He has a normal mood and affect. His behavior is normal. Thought content normal.   Assessment/Plan: 1. Functional deficits/paraparesis secondary to cauda equina syndrome which require 3+ hours per day of interdisciplinary therapy in a comprehensive  inpatient rehab setting. Physiatrist is providing close team supervision and 24 hour management of active medical problems listed below. Physiatrist and rehab team continue to assess barriers to discharge/monitor patient progress toward functional and medical goals.  Function:  Bathing Bathing position      Bathing parts      Bathing assist        Upper Body Dressing/Undressing Upper body dressing                    Upper body assist        Lower Body Dressing/Undressing Lower body dressing                                  Lower body assist        Toileting Toileting          Toileting assist     Transfers Chair/bed transfer             Locomotion Ambulation           Wheelchair          Cognition Comprehension    Expression    Social Interaction    Problem Solving    Memory     Medical Problem List and Plan: 1.  Decreased functional mobility secondary to cauda equina syndrome with large herniated disc L4-5.S/P L4 laminectomy with discectomy 06/07/2016  -  begin CIR therapies 2.  DVT Prophylaxis/Anticoagulation: SCDs. Check vascular study 3. Pain Management: Complete Medrol dose pack, Neurontin 300 mg 3 times a day, Flexeril 10 mg 3 times a day as needed, oxycodone as needed  -pain minimal at this point 4. Mood: Provide emotional support 5. Neuropsych: This patient is capable of making decisions on his own behalf. 6. Skin/Wound Care: Routine skin checks 7. Fluids/Electrolytes/Nutrition: Routine I&O with follow-up chemistries reviewed and within normal limits 8. Neurogenic bowel and bladder.     -dc foley---begin voiding trial  -urecholine 10mg  tid  -daily bowel program with dig stim and supp if needed.   -education provided to patient today 9. Hypertension. No antihypertensive medication at this time. Monitor with increased mobility LOS (Days) 1 A FACE TO FACE EVALUATION WAS PERFORMED  Ranelle OysterSWARTZ,ZACHARY T, MD 06/13/2016 9:18  AM

## 2016-06-13 NOTE — Evaluation (Signed)
Occupational Therapy Assessment and Plan  Patient Details  Name: Andrew [name] MRN: [id number] Date of Birth: 27-Nov-1970  OT Diagnosis: muscle weakness (generalized) and coordination disorder, low back pain Rehab Potential: Rehab Potential (ACUTE ONLY): Good ELOS: 5-7 days   Today's Date: 06/13/2016 OT Individual Time: 1000-1100 OT Individual Time Calculation (min): 60 min     Problem List:  Patient Active Problem List   Diagnosis Date Noted  . Cauda equina syndrome ([place]) 06/12/2016  . Acute low back pain   . Decreased sensation   . Lumbar disc herniation   . Tingling in extremities   . Neurologic gait disorder   . Post-op pain   . Neuropathic pain   . Neurogenic bowel   . Neurogenic bladder   . Benign essential HTN   . Lumbar herniated disc 06/07/2016  . Cauda equina syndrome with neurogenic bladder ([place]) 06/07/2016  . Prediabetes 08/19/2015  . Vitamin D deficiency 07/14/2015  . B12 deficiency 07/14/2015  . Obesity (BMI 30-39.9) 10/05/2014  . Lumbar disc disease with radiculopathy 10/05/2014    Past Medical History:  Past Medical History:  Diagnosis Date  . Arthritis   . Hypertension   . Neuromuscular disorder ([place])    lumbar spinal stenosis     Past Surgical History:  Past Surgical History:  Procedure Laterality Date  . BACK SURGERY  12/26/2014   laminectomy L3-4 and L4-5  . LUMBAR LAMINECTOMY/DECOMPRESSION MICRODISCECTOMY Bilateral 01/21/2015   Procedure: LUMBAR THREE-FOUR, LUMBAR FOUR-FIVE LUMBAR LAMINECTOMY/DECOMPRESSION MICRODISCECTOMY ;  Surgeon: Newman Pies, MD;  Location: [place] [facility];  Service: Neurosurgery;  Laterality: Bilateral;  L34 L45 laminectomies and foraminotomies  . LUMBAR LAMINECTOMY/DECOMPRESSION MICRODISCECTOMY N/A 06/07/2016   Procedure: LUMBAR LAMINECTOMY/DECOMPRESSION MICRODISCECTOMY, LUMBAR FOUR - FIVE;  Surgeon: Newman Pies, MD;  Location: [facility];  Service: Neurosurgery;  Laterality: N/A;  . none  [id number]  . TONSILLECTOMY     as a  child   . WISDOM TOOTH EXTRACTION     /w bone graft - also readying for implants     Assessment & Plan Clinical Impression: Patient is a 46 y.o. year old male with history of hypertension as well as recent L3-4 and 4-5 laminectomy October 2016.Per chart review and patient, patient independent prior to admission up into the past 2 weeks. 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 Two-level home with bedroom upstairs.Patient was doing well until he returned back to neurosurgery office with complaints of increasing pain as well as acute onset of bilateral leg numbness and weaknessand decreased sensation to void. MRI reviewed, showing herniated disc at L4-5. Underwent L4 laminectomy with L4-5 discectomy using microdissection 06/07/2016 per Dr. Arnoldo Morale.Routine back precautions with no brace required. Hospital course pain management. Maintained on a Medrol dose pack 06/11/2016 10 doses. Ongoing urinary retention and a Foley catheter tube had been in place. Physical and occupational therapy evaluations completed with recommendations of physical medicine rehabilitation consult. Patient improving with ability to ambulate with rolling walker independently.  Patient was admitted for a comprehensive rehabilitation program..  Patient transferred to CIR on 06/12/2016 .    Patient currently requires min with basic self-care skills and IADL secondary to muscle weakness, decreased cardiorespiratoy endurance and decreased standing balance and decreased balance strategies.  Prior to hospitalization, patient could complete IADls with independent .  Patient will benefit from skilled intervention to increase independence with basic self-care skills and increase level of independence with iADL prior to discharge home with care partner.  Anticipate  patient will require intermittent supervision and no further OT follow recommended.  OT - End of Session Activity Tolerance: Decreased this  session Endurance Deficit: Yes Endurance Deficit Description: requires seated rest breaks after short duration mobility tasks OT Assessment Rehab Potential (ACUTE ONLY): Good OT Patient demonstrates impairments in the following area(s): Balance;Endurance;Safety OT Basic ADL's Functional Problem(s): Grooming;Bathing;Dressing;Toileting OT Advanced ADL's Functional Problem(s): Simple Meal Preparation;Laundry OT Transfers Functional Problem(s): Toilet;Tub/Shower OT Additional Impairment(s): None OT Plan OT Intensity: Minimum of 1-2 x/day, 45 to 90 minutes OT Frequency: 5 out of 7 days OT Duration/Estimated Length of Stay: 5-7 days OT Treatment/Interventions: Balance/vestibular training;Community reintegration;Discharge planning;Functional mobility training;Psychosocial support;Therapeutic Activities;UE/LE Coordination activities;Patient/family education;DME/adaptive equipment instruction;UE/LE Strength taining/ROM;Self Care/advanced ADL retraining;Therapeutic Exercise OT Self Feeding Anticipated Outcome(s): mod I  OT Basic Self-Care Anticipated Outcome(s): mod I  OT Toileting Anticipated Outcome(s): mod I  OT Bathroom Transfers Anticipated Outcome(s): mod I - toilet, supervision - shower OT Recommendation Recommendations for Other Services: Other (comment) (none needed) Patient destination: Home Follow Up Recommendations: None Equipment Recommended: Tub/shower seat;3 in 1 bedside comode   Skilled Therapeutic Intervention Upon entering the room, pt seated in recliner chair awaiting therapist. Pt with no c/o pain this session. Pt verbalized back precautions with 100% accuracy this session. OT covers pt's incision for shower. Pt ambulated with RW and steady assistance to obtain clothing items and transfer onto TTB. Pt bathing at seated level with supervision overall for safety. Pt ambulating to sit onto EOB for dressing tasks and following precautions without cues to do so. OT educated pt on OT  purpose, POC, and goals with with pt verbalizing understanding and agreement. Pt returning to bed to rest at end of session. Call bell and all needed items within reach upon exiting the room.   OT Evaluation Precautions/Restrictions  Precautions Precautions: Back Spinal Brace: Other (comment) Spinal Brace Comments: no brace needed per MD order Restrictions Weight Bearing Restrictions: No  Pain Pain Assessment Pain Assessment: No/denies pain Home Living/Prior Functioning Home Living Family/patient expects to be discharged to:: Private residence Living Arrangements: Spouse/significant other Available Help at Discharge: Family, Available PRN/intermittently Type of Home: House Home Access: Stairs to enter Technical brewer of Steps: 4-5 Entrance Stairs-Rails: Right Home Layout: Two level, 1/2 bath on main level, Bed/bath upstairs Alternate Level Stairs-Number of Steps: flight Alternate Level Stairs-Rails: Left Bathroom Shower/Tub: Estate agent Accessibility: Yes  Lives With: Spouse, Family (68 and 15 year old daughters) Prior Function Comments: completely independent and working full time as a Engineer, building services; has 2 young daughters; enjoys riding motorcycles Vision/Perception  Vision- History Baseline Vision/History: No visual deficits  Cognition Overall Cognitive Status: Within Functional Limits for tasks assessed Arousal/Alertness: Awake/alert Orientation Level: Person;Place;Situation Person: Oriented Place: Oriented Situation: Oriented Year: 2018 Month: March Day of Week: Correct Memory: Appears intact Immediate Memory Recall: Sock;Blue;Bed Memory Recall: Sock;Blue;Bed Memory Recall Sock: Without Cue Memory Recall Blue: Without Cue Memory Recall Bed: Without Cue Safety/Judgment: Appears intact Sensation Sensation Light Touch: Appears Intact Proprioception: Appears Intact Coordination Gross Motor Movements are Fluid and  Coordinated: No Fine Motor Movements are Fluid and Coordinated: Yes Heel Shin Test: decreased excursion/speed Motor  Motor Motor: Paraplegia Motor - Skilled Clinical Observations: paraparesis Mobility  Bed Mobility Bed Mobility: Supine to Sit;Sit to Supine Supine to Sit: 4: Min guard Sit to Supine: 4: Min guard Sit to Supine - Details: Verbal cues for precautions/safety;Verbal cues for gait pattern;Verbal cues for sequencing Transfers Sit to Stand: 4: Min guard Stand to Sit:  4: Min guard  Trunk/Postural Assessment  Cervical Assessment Cervical Assessment: Within Functional Limits Thoracic Assessment Thoracic Assessment: Within Functional Limits Lumbar Assessment Lumbar Assessment: Exceptions to [facility] (back precautions) Postural Control Postural Control: Deficits on evaluation  Balance Balance Balance Assessed: Yes Static Sitting Balance Static Sitting - Balance Support: Feet supported;No upper extremity supported Static Sitting - Level of Assistance: 6: Modified independent (Device/Increase time) Dynamic Sitting Balance Dynamic Sitting - Balance Support: Feet supported;No upper extremity supported Dynamic Sitting - Level of Assistance: 6: Modified independent (Device/Increase time) Static Standing Balance Static Standing - Balance Support: No upper extremity supported Static Standing - Level of Assistance: 5: Stand by assistance Dynamic Standing Balance Dynamic Standing - Balance Support: Bilateral upper extremity supported;During functional activity Dynamic Standing - Level of Assistance: 5: Stand by assistance Dynamic Standing - Balance Activities: Lateral lean/weight shifting;Forward lean/weight shifting;Reaching for objects Extremity/Trunk Assessment RUE Assessment RUE Assessment: Within Functional Limits LUE Assessment LUE Assessment: Within Functional Limits   See Function Navigator for Current Functional Status.   Refer to Care Plan for Long Term  Goals  Recommendations for other services: None    Discharge Criteria: Patient will be discharged from OT if patient refuses treatment 3 consecutive times without medical reason, if treatment goals not met, if there is a change in medical status, if patient makes no progress towards goals or if patient is discharged from hospital.  The above assessment, treatment plan, treatment alternatives and goals were discussed and mutually agreed upon: by patient  Gypsy Decant 06/13/2016, 10:21 AM

## 2016-06-13 NOTE — Progress Notes (Signed)
Social Work Patient ID: Andrew [name], male   DOB: 07-01-1970, 46 y.o.   MRN: [id number]  Met with pt to update him ion his team conference goals mod/I-supervision level and target discharge date 3/24. He is very pleased with his progress today in therapies and feels he will be ready to go home Sat. He will only be alone from 8am-1pm. Will work on discharge needs.

## 2016-06-13 NOTE — Progress Notes (Signed)
Physical Therapy Session Note  Patient Details  Name: Andrew [name] MRN: [id number] Date of Birth: 1970/09/24  Today's Date: 06/13/2016 PT Individual Time: 1430-1530 PT Individual Time Calculation (min): 60 min   Short Term Goals: Week 1:  PT Short Term Goal 1 (Week 1): =LTG due to estimated LOS  Skilled Therapeutic Interventions/Progress Updates: Pt received seated in w/c, denies pain and agreeable to treatment. W/c propulsion to gym BUE, to return to room at session pt propelled with BLE for strengthening and coordination. Gait trial x90' with SPC and min guard; occasional minor LOBs/unsteadiness when backing up to chair prior to sitting, and with turning corners. Note improved ankle stability with ace wraps. Instructed pt in BLE exercises including bridging, sidelying clamshell hip abduction/ER, and prone lying hip extension. Provided pt with handout of exercises to perform independently in room. Reviewed back precautions with pt; pt able to verbalize all precautions however requires reminder to maintain precautions during activity. Performed nustep x13 min with BLE level 5 with average 40 steps/min for strengthening and aerobic endurance. Returned to room w/c propulsion as above; remained seated in w/c with all needs in reach.    Therapy Documentation Precautions:  Precautions Precautions: Back Spinal Brace: Other (comment) Spinal Brace Comments: no brace needed per MD order Restrictions Weight Bearing Restrictions: No Pain: Pain Assessment Pain Assessment: No/denies pain   See Function Navigator for Current Functional Status.   Therapy/Group: Individual Therapy  Vista Lawmanlizabeth J Tygielski 06/13/2016, 4:29 PM

## 2016-06-13 NOTE — Progress Notes (Signed)
Patient information reviewed and entered into eRehab system by Rupinder Livingston, RN, CRRN, PPS Coordinator.  Information including medical coding and functional independence measure will be reviewed and updated through discharge.    

## 2016-06-13 NOTE — Evaluation (Signed)
Physical Therapy Assessment and Plan  Patient Details  Name: Andrew [name] MRN: [id number] Date of Birth: [birth year]  PT Diagnosis: Abnormality of gait, Difficulty walking, Low back pain, Muscle weakness and Paralysis Rehab Potential: Excellent ELOS: 5-7 days   Today's Date: 06/13/2016 PT Individual Time: 0900-1000 PT Individual Time Calculation (min): 60 min    Problem List:  Patient Active Problem List   Diagnosis Date Noted  . Cauda equina syndrome ([place]) 06/12/2016  . Acute low back pain   . Decreased sensation   . Lumbar disc herniation   . Tingling in extremities   . Neurologic gait disorder   . Post-op pain   . Neuropathic pain   . Neurogenic bowel   . Neurogenic bladder   . Benign essential HTN   . Lumbar herniated disc 06/07/2016  . Cauda equina syndrome with neurogenic bladder ([place]) 06/07/2016  . Prediabetes 08/19/2015  . Vitamin D deficiency 07/14/2015  . B12 deficiency 07/14/2015  . Obesity (BMI 30-39.9) 10/05/2014  . Lumbar disc disease with radiculopathy 10/05/2014    Past Medical History:  Past Medical History:  Diagnosis Date  . Arthritis   . Hypertension   . Neuromuscular disorder ([place])    lumbar spinal stenosis     Past Surgical History:  Past Surgical History:  Procedure Laterality Date  . BACK SURGERY  12/26/2014   laminectomy L3-4 and L4-5  . LUMBAR LAMINECTOMY/DECOMPRESSION MICRODISCECTOMY Bilateral 01/21/2015   Procedure: LUMBAR THREE-FOUR, LUMBAR FOUR-FIVE LUMBAR LAMINECTOMY/DECOMPRESSION MICRODISCECTOMY ;  Surgeon: Newman Pies, MD;  Location: [place] [facility];  Service: Neurosurgery;  Laterality: Bilateral;  L34 L45 laminectomies and foraminotomies  . LUMBAR LAMINECTOMY/DECOMPRESSION MICRODISCECTOMY N/A 06/07/2016   Procedure: LUMBAR LAMINECTOMY/DECOMPRESSION MICRODISCECTOMY, LUMBAR FOUR - FIVE;  Surgeon: Newman Pies, MD;  Location: [place];  Service: Neurosurgery;  Laterality: N/A;  . none  [phone]  . TONSILLECTOMY     as a child   .  WISDOM TOOTH EXTRACTION     /w bone graft - also readying for implants     Assessment & Plan Clinical Impression: U46 y.o.right handed malewith history of hypertension as well as recent L3-4 and 4-5 laminectomy October 2016.Per chart review patient independent prior to admission up into the past 2 weeks. 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 Two-level home with bedroom upstairs.Patient was doing well until he returned back to neurosurgery office with complaints of increasing pain as well as acute onset of bilateral leg numbness and weaknessand decreased sensation to void. MRI imaging revealed a huge herniated disc at L4-5. Underwent L4 laminectomy with L4-5 discectomy using microdissection 06/07/2016 per Dr. Arnoldo Morale.Routine back precautions with no brace required.Hospital course pain management.Maintain on a Medrol dose pack 06/11/2016 10 doses. Ongoing urinary retention and a Foley catheter tube had been in place.Physical and occupational therapy evaluations Completed with recommendations of physical medicine rehabilitation consult. Patient was admitted for a comprehensive rehabilitation program.  Patient transferred to CIR on 06/12/2016 .   Patient currently requires min with mobility secondary to muscle weakness, unbalanced muscle activation and decreased coordination and decreased standing balance, decreased postural control, decreased balance strategies and difficulty maintaining precautions.  Prior to hospitalization, patient was independent  with mobility and lived with   in a   home.  Home access is 4-5Stairs to enter.  Patient will benefit from skilled PT intervention to maximize safe functional mobility, minimize fall risk and decrease caregiver burden for planned discharge home alone.  Anticipate patient will benefit from  follow up OP at discharge.  PT - End of Session Activity Tolerance: Tolerates 30+ min activity with multiple rests Endurance  Deficit: Yes Endurance Deficit Description: requires seated rest breaks after short duration mobility tasks PT Assessment Rehab Potential (ACUTE/IP ONLY): Excellent Barriers to Discharge: Inaccessible home environment;Decreased caregiver support PT Patient demonstrates impairments in the following area(s): Balance;Endurance;Motor;Safety PT Transfers Functional Problem(s): Bed Mobility;Bed to Chair;Car;Furniture PT Locomotion Functional Problem(s): Ambulation;Wheelchair Mobility;Stairs PT Plan PT Intensity: Minimum of 1-2 x/day ,45 to 90 minutes PT Frequency: 5 out of 7 days PT Duration Estimated Length of Stay: 5-7 days PT Treatment/Interventions: Ambulation/gait training;Balance/vestibular training;Community reintegration;Functional electrical stimulation;Patient/family education;Stair training;UE/LE Coordination activities;UE/LE Strength taining/ROM;Splinting/orthotics;Pain management;DME/adaptive equipment instruction;Disease management/prevention;Neuromuscular re-education;Therapeutic Exercise;Wheelchair propulsion/positioning;Therapeutic Activities;Psychosocial support;Functional mobility training;Discharge planning PT Transfers Anticipated Outcome(s): modI with LRAD PT Locomotion Anticipated Outcome(s): modI gait, S stairs PT Recommendation Follow Up Recommendations: Outpatient PT Patient destination: Home Equipment Recommended: To be determined  Skilled Therapeutic Intervention Pt received seated in w/c; denies pain and agreeable to treatment. PT initial evaluation performed and completed with minA/min guard overall as described below d/t LE strength/coordination deficits. Educated pt on rehab process, goals, estimated LOS and falls prevention safety; pt agreeable to all the above. Educated pt on BLE plantarflexor/ankle eversion exercises to perform independently. Remained seated in w/c at end of session, all needs in reach.   PT  Evaluation Precautions/Restrictions Precautions Precautions: Back Spinal Brace Comments: no brace needed per MD order Restrictions Weight Bearing Restrictions: No General Chart Reviewed: Yes Response to Previous Treatment: Patient reporting fatigue but able to participate. Family/Caregiver Present: No  Pain Pain Assessment Pain Assessment: No/denies pain Home Living/Prior Functioning Home Living Available Help at Discharge: Family;Available PRN/intermittently Type of Home: House Home Access: Stairs to enter [employer] of Steps: 4-5 Entrance Stairs-Rails: Right Home Layout: Two level;1/2 bath on main level;Bed/bath upstairs Alternate Level Stairs-Number of Steps: flight Alternate Level Stairs-Rails: Left Bathroom Shower/Tub: Multimedia programmer: Standard Bathroom Accessibility: Yes  Lives With: Spouse;Family (50 and 92 year old daughters) Prior Function Comments: completely independent and working full time as a Engineer, building services; has 2 young daughters; enjoys riding motorcycles Vision/Perception   [employer]  Cognition Overall Cognitive Status: Within Functional Limits for tasks assessed Arousal/Alertness: Awake/alert Orientation Level: Oriented X4 Memory: Appears intact Awareness: Appears intact Problem Solving: Appears intact Safety/Judgment: Appears intact Sensation Sensation Light Touch: Appears Intact Proprioception: Appears Intact Coordination Gross Motor Movements are Fluid and Coordinated: No Heel Shin Test: decreased excursion/speed Motor  Motor Motor: Paraplegia Motor - Skilled Clinical Observations: paraparesis  Mobility Bed Mobility Bed Mobility: Supine to Sit;Sit to Supine Supine to Sit: 4: Min guard Sit to Supine: 4: Min guard Sit to Supine - Details: Verbal cues for precautions/safety;Verbal cues for gait pattern;Verbal cues for sequencing Transfers Transfers: Yes Sit to Stand: 4: Min guard Stand to Sit: 4: Min guard Stand Pivot  Transfers: 4: Min Psychologist, occupational Details: Verbal cues for technique;Verbal cues for precautions/safety Locomotion  Ambulation Ambulation: Yes Ambulation/Gait Assistance: 4: Min guard Ambulation Distance (Feet): 175 Feet Assistive device: Rolling walker Gait Gait: Yes Gait Pattern: Impaired Gait Pattern: Poor foot clearance - left;Poor foot clearance - right;Lateral hip instability;Decreased stride length Gait velocity: decreased Stairs / Additional Locomotion Stairs: Yes Stairs Assistance: 4: Min guard Stair Management Technique: Alternating pattern;Forwards;Two rails Number of Stairs: 12 Height of Stairs: 3 Ramp: 4: Min Chemical engineer: Yes Wheelchair Assistance: 5: Supervision Wheelchair Assistance Details: Verbal cues for technique;Verbal cues for precautions/safety Wheelchair Parts Management: Supervision/cueing  Distance: 150'  Trunk/Postural Assessment  Cervical Assessment Cervical Assessment: Within Functional Limits Thoracic Assessment Thoracic Assessment: Within Functional Limits Lumbar Assessment Lumbar Assessment: Exceptions to [facility] (back precautions) Postural Control Postural Control: Deficits on evaluation (delayed ankle strategy, stepping reactions in standing)  Balance Balance Balance Assessed: Yes Static Sitting Balance Static Sitting - Balance Support: Feet supported;No upper extremity supported Static Sitting - Level of Assistance: 6: Modified independent (Device/Increase time) Dynamic Sitting Balance Dynamic Sitting - Balance Support: Feet supported;No upper extremity supported Dynamic Sitting - Level of Assistance: 6: Modified independent (Device/Increase time) Static Standing Balance Static Standing - Balance Support: No upper extremity supported Static Standing - Level of Assistance: 5: Stand by assistance Dynamic Standing Balance Dynamic Standing - Balance Support: Bilateral upper extremity  supported;During functional activity Dynamic Standing - Level of Assistance: 5: Stand by assistance Dynamic Standing - Balance Activities: Lateral lean/weight shifting;Forward lean/weight shifting;Reaching for objects Extremity Assessment    BUE WFL for functional tasks   RLE Assessment RLE Assessment: Exceptions to [facility] RLE Strength Right Hip Flexion: 4/5 Right Knee Flexion: 4+/5 Right Knee Extension: 4+/5 Right Ankle Dorsiflexion: 4/5 Right Ankle Plantar Flexion: 2/5 Right Ankle Inversion: 4/5 Right Ankle Eversion: 1/5 LLE Assessment LLE Assessment: Exceptions to [facility] LLE Strength Left Hip Flexion: 4/5 Left Knee Flexion: 4/5 Left Knee Extension: 4+/5 Left Ankle Dorsiflexion: 3/5 Left Ankle Plantar Flexion: 2/5 Left Ankle Inversion: 3/5 Left Ankle Eversion: 1/5   See Function Navigator for Current Functional Status.   Refer to Care Plan for Long Term Goals  Recommendations for other services: None   Discharge Criteria: Patient will be discharged from PT if patient refuses treatment 3 consecutive times without medical reason, if treatment goals not met, if there is a change in medical status, if patient makes no progress towards goals or if patient is discharged from hospital.  The above assessment, treatment plan, treatment alternatives and goals were discussed and mutually agreed upon: by patient  Luberta Mutter 06/13/2016, 10:07 AM

## 2016-06-13 NOTE — Progress Notes (Signed)
**  Preliminary report by tech**  Bilateral lower extremity venous duplex completed. There is no evidence of deep or superficial vein thrombosis involving the right and left lower extremities. All visualized vessels appear patent and compressible. There is no evidence of Baker's cysts bilaterally.  06/13/16 4:53 PM Olen CordialGreg Marketia Stallsmith RVT

## 2016-06-13 NOTE — Progress Notes (Signed)
Occupational Therapy Session Note  Patient Details  Name: Andrew [name] Tipping MRN: [id number] Date of Birth: [birth year]  Today's Date: 06/13/2016 OT Individual Time: 1300-1400 OT Individual Time Calculation (min): 60 min    Short Term Goals: Week 1:    STG=LTG due to LOS  Skilled Therapeutic Interventions/Progress Updates:    Pt seen for OT session focusing on functional transfers and ambulation. Pt sitting up in w/c upon arrival, eager for tx session. He desired to attempt voiding in toilet. He ambulated into bathroom with RW, VCs for RW management and min A. Pt unable to void. He ambulated to therapy gym alternating using RW with close supervision and trials without AD with heavy steadying assist due to weak ankles/ hips. In ADL apartment, completed simulated shower stall transfer, completed with CGA following demonstration for RW management to complete task. Pt voiced feeling shower chair will work in home environment and also requested use of BSC over toilet for easier transfers. Placed ACE wraps around B ankles for increased support/ stabilization, pt voiced increased comfort and security with ACE wraps applied. Handout provided for recommendation for ankle braces which pt wishes to private purchase.  Trialed rollator and single point cane as AD. Pt voiced pleasure and confidence walking with SPC and demonstrated increased endurance with SPC vs. No AD. Ambulated back to [facility] and close supervision, x2 LOB episodes with pt able to self correct. Pt began to have small amount of incontinent urination, when he returned to room pt was able to stand at toilet for full void. He returned to w/c and RN made aware of pt's void and need for scan. Pt left seated in w/c with all needs in reach.    Therapy Documentation Precautions:  Precautions Precautions: Back Spinal Brace: Other (comment) Spinal Brace Comments: no brace needed per MD order Restrictions Weight Bearing Restrictions:  No Pain: Pain Assessment Pain Assessment: No/denies pain  See Function Navigator for Current Functional Status.   Therapy/Group: Individual Therapy  [name], Bentlee Benningfield C 06/13/2016, 2:05 PM

## 2016-06-14 ENCOUNTER — Inpatient Hospital Stay (HOSPITAL_COMMUNITY): Payer: BLUE CROSS/BLUE SHIELD | Admitting: Physical Therapy

## 2016-06-14 ENCOUNTER — Inpatient Hospital Stay (HOSPITAL_COMMUNITY): Payer: BLUE CROSS/BLUE SHIELD | Admitting: Occupational Therapy

## 2016-06-14 DIAGNOSIS — M1990 Unspecified osteoarthritis, unspecified site: Secondary | ICD-10-CM | POA: Diagnosis not present

## 2016-06-14 DIAGNOSIS — G834 Cauda equina syndrome: Secondary | ICD-10-CM | POA: Diagnosis not present

## 2016-06-14 DIAGNOSIS — I10 Essential (primary) hypertension: Secondary | ICD-10-CM

## 2016-06-14 DIAGNOSIS — R269 Unspecified abnormalities of gait and mobility: Secondary | ICD-10-CM | POA: Diagnosis not present

## 2016-06-14 NOTE — Progress Notes (Signed)
Physical Therapy Session Note  Patient Details  Name: Andrew [name] Lokey MRN: [id number] Date of Birth: January 18, 1971  Today's Date: 06/14/2016 PT Individual Time: 0900-1000 and 1500-1530 PT Individual Time Calculation (min): 60 min and 30 min (total 90 min)   Short Term Goals: Week 1:  PT Short Term Goal 1 (Week 1): =LTG due to estimated LOS  Skilled Therapeutic Interventions/Progress Updates: Tx 1: Pt received seated in w/c, denies pain and agreeable to treatment. Pt wraps BLE ankles with ace wrap with min cues for technique. Gait to gym with SPC and min guard, occasional staggering and slow speed noted. Assessed berg and TUG as below with results indicating high risk for falls; limited primarily by plantarflexion deficits, poor ankle strategy. Progressive balance exercises in parallel bars with narrow BOS, tandem BOS, single limb stance, airex foam pad and wedge oriented anteriorly to facilitate weight bearing through ball of foot and activate plantflexors. Standing stepping strategy to numbered targets in half clock for compensatory balance strategy with impaired ankle strategy. Side stepping in parallel bars for BLE coordination and hip abduction strengthening. Gait to return to room with min guard and SPC. Remained seated in w/c at end of session, all needs in reach.   Tx 2: Pt received seated on EOB; denies pain and agreeable to treatment. Gait to/from gym with [facility] and close S. Tall kneeling, and half kneeling alternating LEs while engaged in BUE pipe tree for BLE coordination, pelvic/hip stability. Side stepping in tall kneeling on mat table R/L with min guard for hip abduction and LE coordination; one LOB to L side, recovered with use of UEs on bench. Returned to room gait as above; remained seated on EOB at end of session, all needs in reach.      Therapy Documentation Precautions:  Precautions Precautions: Back Spinal Brace: Other (comment) Spinal Brace Comments: no brace needed per MD  order Restrictions Weight Bearing Restrictions: No Pain: Pain Assessment Pain Assessment: No/denies pain  Balance: Standardized Balance Assessment Standardized Balance Assessment: Berg Balance Test;Timed Up and Go Test Berg Balance Test Sit to Stand: Able to stand  independently using hands Standing Unsupported: Able to stand 2 minutes with supervision Sitting with Back Unsupported but Feet Supported on Floor or Stool: Able to sit safely and securely 2 minutes Stand to Sit: Sits safely with minimal use of hands Transfers: Able to transfer with verbal cueing and /or supervision Standing Unsupported with Eyes Closed: Able to stand 3 seconds Standing Ubsupported with Feet Together: Able to place feet together independently and stand for 1 minute with supervision From Standing, Reach Forward with Outstretched Arm: Reaches forward but needs supervision From Standing Position, Pick up Object from Floor: Unable to try/needs assist to keep balance (NT d/t back precautions) From Standing Position, Turn to Look Behind Over each Shoulder: Needs supervision when turning Turn 360 Degrees: Needs close supervision or verbal cueing Standing Unsupported, Alternately Place Feet on Step/Stool: Able to complete >2 steps/needs minimal assist Standing Unsupported, One Foot in Front: Able to take small step independently and hold 30 seconds Standing on One Leg: Unable to try or needs assist to prevent fall Total Score: 27 Timed Up and Go Test TUG: Normal TUG Normal TUG (seconds): 16   See Function Navigator for Current Functional Status.   Therapy/Group: Individual Therapy  Vista Lawmanlizabeth J Tygielski 06/14/2016, 11:09 AM

## 2016-06-14 NOTE — Discharge Summary (Signed)
Discharge summary job # [id number]

## 2016-06-14 NOTE — Care Management Note (Signed)
[facility] Individual Statement of Services  Patient Name:  Andrew [name]  Date:  06/14/2016  Welcome to the [facility].  Our goal is to provide you with an individualized program based on your diagnosis and situation, designed to meet your specific needs.  With this comprehensive rehabilitation program, you will be expected to participate in at least 3 hours of rehabilitation therapies Monday-Friday, with modified therapy programming on the weekends.  Your rehabilitation program will include the following services:  Physical Therapy (PT), Occupational Therapy (OT), 24 hour per day rehabilitation nursing, Case Management (Social Worker), Rehabilitation Medicine, Nutrition Services and Pharmacy Services  Weekly team conferences will be held on Tuesdays to discuss your progress.  Your Social Worker will talk with you frequently to get your input and to update you on team discussions.  Team conferences with you and your family in attendance may also be held.  Expected length of stay: 5-7 days  Overall anticipated outcome: modified independent  Depending on your progress and recovery, your program may change. Your Social Worker will coordinate services and will keep you informed of any changes. Your Social Worker's name and contact numbers are listed  below.  The following services may also be recommended but are not provided by the [facility]:   Driving Evaluations  Home Health Rehabiltiation Services  Outpatient Rehabilitation Services  Vocational Rehabilitation   Arrangements will be made to provide these services after discharge if needed.  Arrangements include referral to agencies that provide these services.  Your insurance has been verified to be:  BCBS Your primary doctor is:  Dr. Judithann GravesBerglund  Pertinent information will be shared with your doctor and your insurance company.  Social Worker:  BerkeleyLucy Shron Ozer, [place] [phone] or  ([phone]) [phone]   Information discussed with and copy given to patient by: Amada JupiterHOYLE, Jannie Doyle, 06/14/2016, 11:18 AM

## 2016-06-14 NOTE — Progress Notes (Signed)
Physical Therapy Session Note  Patient Details  Name: Andrew [name] MRN: [id number] Date of Birth: 06-05-1970  Today's Date: 06/14/2016 PT Individual Time: [id number] PT Individual Time Calculation (min): 25 min   Short Term Goals: Week 1:  PT Short Term Goal 1 (Week 1): =LTG due to estimated LOS  Skilled Therapeutic Interventions/Progress Updates:   Pt received sitting EOB and agreeable to PT  Gait with SPC supervision assist form PT for 2 bouts of 25000ft. Min cues for proper use of AD and safety and obstacles negotiation. Pt noted to have improved stability, decreased step width and    BLE and BUE strengthening and endurance training on nustep. 10 min, on level 7. Min cues for focused use of posterior extensor muscles.   Dynamic balance with SPC to weave through 6 cones and step over 2 cones x 2. superversion assist from PT. With min cues for proper gait pattern and improved use of Ad with stepping over obstacles to prevent anterior LOB.       Therapy Documentation Precautions:  Precautions Precautions: Back Spinal Brace: Other (comment) Spinal Brace Comments: no brace needed per MD order Restrictions Weight Bearing Restrictions: No Pain 0/10  Vital Signs: Therapy Vitals Temp: 97.8 F (36.6 C) Temp Source: Oral Pulse Rate: 95 Resp: 18 BP: (!) 161/92 (RN Notified) Patient Position (if appropriate): Lying Oxygen Therapy SpO2: 100 % O2 Device: Not Delivered   See Function Navigator for Current Functional Status.   Therapy/Group: Individual Therapy  [name] 06/14/2016, 5:32 PM

## 2016-06-14 NOTE — Discharge Instructions (Signed)
Inpatient Rehab Discharge Instructions  Andrew [name] [name] Discharge date and time: No discharge date for patient encounter.   Activities/Precautions/ Functional Status: Activity: activity as tolerated Diet: regular diet Wound Care: keep wound clean and dry Functional status:  ___ No restrictions     ___ Walk up steps independently ___ 24/7 supervision/assistance   ___ Walk up steps with assistance ___ Intermittent supervision/assistance  ___ Bathe/dress independently ___ Walk with walker     _x__ Bathe/dress with assistance ___ Walk Independently    ___ Shower independently ___ Walk with assistance    ___ Shower with assistance ___ No alcohol     ___ Return to work/school ________    COMMUNITY REFERRALS UPON DISCHARGE:    Outpatient: PT                   Agency:  [facility] Phone: [phone]   (located @ [address] [place], [place]                                                                                                                                                                                             Appointment Date/Time:  3/26 @ 2:00 pm (please arrive @ 1:30)  Medical Equipment/Items Ordered:cane, commode and tub seat                                                    Agency/Supplier:  [facility] @ [phone]     Special Instructions: No driving  My questions have been answered and I understand these instructions. I will adhere to these goals and the provided educational materials after my discharge from the hospital.  Patient/Caregiver Signature _______________________________ Date __________  Clinician Signature _______________________________________ Date __________  Please bring this form and your medication list with you to all your follow-up doctor's appointments.

## 2016-06-14 NOTE — Progress Notes (Signed)
Social Work  Social Work Assessment and Plan  Patient Details  Name: Andrew [name] MRN: [id number] Date of Birth: May 31, 1970  Today's Date: 06/14/2016  Problem List:  Patient Active Problem List   Diagnosis Date Noted  . Cauda equina syndrome (HCC) 06/12/2016  . Acute low back pain   . Decreased sensation   . Lumbar disc herniation   . Tingling in extremities   . Neurologic gait disorder   . Post-op pain   . Neuropathic pain   . Neurogenic bowel   . Neurogenic bladder   . Benign essential HTN   . Lumbar herniated disc 06/07/2016  . Cauda equina syndrome with neurogenic bladder (HCC) 06/07/2016  . Prediabetes 08/19/2015  . Vitamin D deficiency 07/14/2015  . B12 deficiency 07/14/2015  . Obesity (BMI 30-39.9) 10/05/2014  . Lumbar disc disease with radiculopathy 10/05/2014   Past Medical History:  Past Medical History:  Diagnosis Date  . Arthritis   . Hypertension   . Neuromuscular disorder (HCC)    lumbar spinal stenosis     Past Surgical History:  Past Surgical History:  Procedure Laterality Date  . BACK SURGERY  12/26/2014   laminectomy L3-4 and L4-5  . LUMBAR LAMINECTOMY/DECOMPRESSION MICRODISCECTOMY Bilateral 01/21/2015   Procedure: LUMBAR THREE-FOUR, LUMBAR FOUR-FIVE LUMBAR LAMINECTOMY/DECOMPRESSION MICRODISCECTOMY ;  Surgeon: Tressie StalkerJeffrey Jenkins, MD;  Location: [facility];  Service: Neurosurgery;  Laterality: Bilateral;  L34 L45 laminectomies and foraminotomies  . LUMBAR LAMINECTOMY/DECOMPRESSION MICRODISCECTOMY N/A 06/07/2016   Procedure: LUMBAR LAMINECTOMY/DECOMPRESSION MICRODISCECTOMY, LUMBAR FOUR - FIVE;  Surgeon: Tressie StalkerJeffrey Jenkins, MD;  Location: [facility] OR;  Service: Neurosurgery;  Laterality: N/A;  . none  [id number]  . TONSILLECTOMY     as a child   . WISDOM TOOTH EXTRACTION     /w bone graft - also readying for implants    Social History:  reports that he quit smoking about 14 years ago. He has never used smokeless tobacco. He reports that he drinks alcohol. He  reports that he does not use drugs.  Family / Support Systems Marital Status: Married Patient Roles: Spouse, Parent Spouse/Significant Other: wife, Ria ClockJillian Wingard @ ([phone] Children: pt and wife have two daughters ages 607 and 10 yrs Anticipated Caregiver: self and wife Ability/Limitations of Caregiver: Wife works 8 am to 1 pm Caregiver Availability: Intermittent Family Dynamics: Pt describes wife as very supportive.  Denies any concerns about help at home.  Social History Preferred language: English Religion:  Cultural Background: [place] 14 yrs ago Education: college Read: Yes Write: Yes Employment Status: Employed Return to Work Plans: fully plans to return to work when medically cleared to do so. Legal Hisotry/Current Legal Issues: None Guardian/Conservator: None - per MD, pt is capable of making decisions on his own behalf.   Abuse/Neglect Physical Abuse: Denies Verbal Abuse: Denies Sexual Abuse: Denies Exploitation of patient/patient's resources: Denies Self-Neglect: Denies  Emotional Status Pt's affect, behavior adn adjustment status: Pt very pleasant, talkative and denies any significant emotional distress.  Bright affect and smiling through interview.  Pleased with progress made to date and denies any concerns about resuming independent lifestyle. Recent Psychosocial Issues: None Pyschiatric History: None Substance Abuse History: None  Patient / Family Perceptions, Expectations & Goals Pt/Family understanding of illness & functional limitations: Pt and family with good understanding of the surgery performed and current functional limitations/ need for CIR. Premorbid pt/family roles/activities: independent and working f/t Anticipated changes in roles/activities/participation: No significant changes anticipated as pt with mod  ind goals. Pt/family expectations/goals: Pt eager to get home and confident in reaching  goals.  Community Resources [employer]: None Premorbid Home Care/DME Agencies: None Transportation available at discharge: yes  Discharge Planning Living Arrangements: Spouse/significant other, Children Support Systems: Spouse/significant other, Friends/neighbors Type of Residence: Private residence Civil engineer, contracting: Media planner (specify) Herbalist) Financial Resources: Employment Surveyor, quantity Screen Referred: No Money Management: Patient Does the patient have any problems obtaining your medications?: No Home Management: pt and wife Patient/Family Preliminary Plans: Pt to d/c home with wife and young daughters Social Work Anticipated Follow Up Needs: HH/OP Expected length of stay: 5-7 days  Clinical Impression Very pleasant gentleman here following back surgery and on track to quickly meet mod independent goals.  Anticipate end of week d/c.  He denies any concerns and pleased with progress. Will follow for d/c planning needs.  Claribel Sachs 06/14/2016, 11:13 AM

## 2016-06-14 NOTE — Progress Notes (Signed)
Occupational Therapy Session Note  Patient Details  Name: Andrew [name] MRN: [id number] Date of Birth: May 06, 1970  Today's Date: 06/14/2016  OT Individual Time: 1045-1200 and 1400-1500 OT Individual Time Calculation (min): 75 min and 60 min   Short Term Goals: Week 1:  OT Short Term Goal 1 (Week 1): STGs=LTGs secondary to estimated short LOS  Skilled Therapeutic Interventions/Progress Updates:    Session One: Pt seen for OT session focusing on neuro re-ed and functional standing balance and ambulation. Pt in w/c upon arrival, agreeable to tx session. In therapy gym, addressed pt's weak plantar flexion utilizing balance board, foam  Mat, and use of parallel bars. Focus on anterior weightshift onto toes as pt  Compensates for weak plantar flexion by weightbearing primarily through heels. Demonstrated inability to weight shift back onto heals following anterior weight shift onto toes.  Completed obstacle course weaving through cones addressing functional standing balance and ambulation in simulation of home environment. Completed without use of AD, and CGA. Minimal LOB episodes which pt was able to self correct. Pt ambulated back to room at end of session, left seated in w/c with all needs in reach.   Session Two: Pt seen for OT session addressing simple meal prep goal and for LE stretching and strengthening. Pt in supine upon arrival, agreeable to tx session and denying pain. He ambulated throughout session with supervision, he held [facility], however, did not use throughout session. Completed simple meal prep at stove level at supervision- mod I level. Discussed maintaining of back pre-cautions during functional tasks, pt completing squat with close supervision to obtain frying pan from low shelf. Discussed use of SPC during functional transfers vs steadying assist as needed on counter tops. Pt tolerated>30 minutes dynamic standing without need for rest break. Pt desired stretching of hamstrings.  Returned to supine on mat and hamstring stretch performed. Provided pt with sheet for self stretching and also demonstrated long sitting hamstring stretch position and educated regarding technique and maintaining of spinal pre-cautions during stretching tasks. Pt returned to prone on mat, massage performed to R hamstring with pt voicing increased comfort following massage. Obtained quadraped position with supervision, alternating UE support for core strengthening activity. In sidelying position, pt completed hip ABduction x10 on R/L, pt happy with increased ROM with exercise compared to yesterday. Pt ambulated back to room in same manner as described above, left seated EOB with hand off to PT.   Therapy Documentation Precautions:  Precautions Precautions: Back Spinal Brace: Other (comment) Spinal Brace Comments: no brace needed per MD order Restrictions Weight Bearing Restrictions: No  See Function Navigator for Current Functional Status.   Therapy/Group: Individual Therapy  [name], Kassondra Geil C 06/14/2016, 7:09 AM

## 2016-06-14 NOTE — Progress Notes (Signed)
[place] PHYSICAL MEDICINE & REHABILITATION     PROGRESS NOTE    Subjective/Complaints: Had a good day with therapy. Happy that bowels and bladder are emptying. Encouraged by strength/ambulation  ROS: pt denies nausea, vomiting, diarrhea, cough, shortness of breath or chest pain   Objective: Vital Signs: Blood pressure (!) 161/92, pulse 95, temperature 97.8 F (36.6 C), temperature source Oral, resp. rate 18, height 6\' 1"  (1.854 m), weight 102.3 kg (225 lb 8.5 oz), SpO2 100 %. No results found.  Recent Labs  06/13/16 0628  WBC 9.8  HGB 15.6  HCT 45.7  PLT 301    Recent Labs  06/13/16 0628  NA 140  K 4.6  CL 98*  GLUCOSE 90  BUN 17  CREATININE 0.96  CALCIUM 10.1   CBG (last 3)  No results for input(s): GLUCAP in the last 72 hours.  Wt Readings from Last 3 Encounters:  06/12/16 102.3 kg (225 lb 8.5 oz)  06/07/16 107.8 kg (237 lb 10.5 oz)  05/10/16 109.3 kg (241 lb)    Physical Exam:  Constitutional: He is oriented to person, place, and time. He appears well-developed and well-nourished.  HENT:  Head: Normocephalic and atraumatic.  Eyes: EOMI.  Neck: Normal range of motion. Neck supple. No thyromegaly present.  Cardiovascular: RRR.   Respiratory: CTA B.  GI: soft BS+ NT/ND  Musculoskeletal: He exhibits no edema or tenderness.  Neurological: He is alert and oriented to person, place, and time.  Neurological:  Motor: 5/5 bilateral deltoid, biceps, triceps, grip 4+ to 5/5 bilateral hip flexor, knee extensor ankle dorsiflexor, 4-/5 ankle plantar flexion as well as hip abduction Sensation absent to light touch perineal area as well as soles of feet.   Skin: Skin is warm and dry. Back incision clean and dry with sutures  Psychiatric: He has a normal mood and affect. His behavior is normal. Thought content normal.   Assessment/Plan: 1. Functional deficits/paraparesis secondary to cauda equina syndrome which require 3+ hours per day of interdisciplinary  therapy in a comprehensive inpatient rehab setting. Physiatrist is providing close team supervision and 24 hour management of active medical problems listed below. Physiatrist and rehab team continue to assess barriers to discharge/monitor patient progress toward functional and medical goals.  Function:  Bathing Bathing position   Position: Shower  Bathing parts Body parts bathed by patient: Right arm, Left arm, Chest, Abdomen, Front perineal area, Buttocks, Right upper leg, Left upper leg, Right lower leg, Left lower leg, Back    Bathing assist Assist Level: Supervision or verbal cues      Upper Body Dressing/Undressing Upper body dressing   What is the patient wearing?: Pull over shirt/dress     Pull over shirt/dress - Perfomed by patient: Thread/unthread right sleeve, Thread/unthread left sleeve, Put head through opening, Pull shirt over trunk          Upper body assist Assist Level: Set up, Supervision or verbal cues   Set up : To obtain clothing/put away  Lower Body Dressing/Undressing Lower body dressing   What is the patient wearing?: Underwear, Pants, Socks, Shoes Underwear - Performed by patient: Thread/unthread right underwear leg, Thread/unthread left underwear leg, Pull underwear up/down   Pants- Performed by patient: Thread/unthread right pants leg, Thread/unthread left pants leg, Pull pants up/down       Socks - Performed by patient: Don/doff right sock, Don/doff left sock   Shoes - Performed by patient: Don/doff right shoe, Don/doff left shoe, Fasten right, Fasten left  Lower body assist Assist for lower body dressing: Set up, Supervision or verbal cues      Toileting Toileting Toileting activity did not occur: No continent bowel/bladder event Toileting steps completed by patient: Adjust clothing prior to toileting, Performs perineal hygiene, Adjust clothing after toileting      Toileting assist Assist level: Touching or steadying  assistance (Pt.75%)   Transfers Chair/bed transfer   Chair/bed transfer method: Stand pivot Chair/bed transfer assist level: Touching or steadying assistance (Pt > 75%) Chair/bed transfer assistive device: Armrests, Bedrails     Locomotion Ambulation     Max distance: 175 Assist level: Touching or steadying assistance (Pt > 75%)   Wheelchair   Type: Manual Max wheelchair distance: 150 Assist Level: Supervision or verbal cues  Cognition Comprehension Comprehension assist level: Follows complex conversation/direction with no assist  Expression Expression assist level: Expresses complex ideas: With no assist  Social Interaction Social Interaction assist level: Interacts appropriately with others - No medications needed.  Problem Solving Problem solving assist level: Solves complex problems: Recognizes & self-corrects  Memory Memory assist level: Complete Independence: No helper   Medical Problem List and Plan: 1.  Decreased functional mobility secondary to cauda equina syndrome with large herniated disc L4-5.S/P L4 laminectomy with discectomy 06/07/2016  -continue therapies. Pt doing extremely well.  2.  DVT Prophylaxis/Anticoagulation: SCDs. Check vascular study 3. Pain Management: Complete Medrol dose pack, Neurontin 300 mg 3 times a day, Flexeril 10 mg 3 times a day as needed, oxycodone as needed  -pain minimal at this point 4. Mood: Provide emotional support 5. Neuropsych: This patient is capable of making decisions on his own behalf. 6. Skin/Wound Care: Routine skin checks 7. Fluids/Electrolytes/Nutrition: Routine I&O with follow-up chemistries reviewed and within normal limits 8. Neurogenic bowel and bladder.     -continue voiding trial and urecholine 10mg  tid  -daily bowel program with dig stim and supp if needed.   -  9. Hypertension. No antihypertensive medication at this time. bp persistently high--may need rx LOS (Days) 2 A FACE TO FACE EVALUATION WAS  PERFORMED  Ranelle Oyster, MD 06/14/2016 5:21 PM

## 2016-06-15 ENCOUNTER — Inpatient Hospital Stay (HOSPITAL_COMMUNITY): Payer: BLUE CROSS/BLUE SHIELD | Admitting: Physical Therapy

## 2016-06-15 ENCOUNTER — Inpatient Hospital Stay (HOSPITAL_COMMUNITY): Payer: BLUE CROSS/BLUE SHIELD

## 2016-06-15 ENCOUNTER — Inpatient Hospital Stay (HOSPITAL_COMMUNITY): Payer: BLUE CROSS/BLUE SHIELD | Admitting: Occupational Therapy

## 2016-06-15 MED ORDER — GABAPENTIN 300 MG PO CAPS: 300.0000 mg | ORAL_CAPSULE | Freq: Three times a day (TID) | ORAL | 1 refills | Status: DC

## 2016-06-15 MED ORDER — CYCLOBENZAPRINE HCL 10 MG PO TABS: 10.0000 mg | ORAL_TABLET | Freq: Three times a day (TID) | ORAL | 0 refills | Status: DC | PRN

## 2016-06-15 MED ORDER — BISACODYL 10 MG RE SUPP: 10.0000 mg | Freq: Every day | RECTAL | 0 refills | Status: DC | PRN

## 2016-06-15 MED ORDER — OXYCODONE HCL 5 MG PO TABS: 5.0000 mg | ORAL_TABLET | ORAL | 0 refills | Status: DC | PRN

## 2016-06-15 NOTE — Progress Notes (Signed)
Physical Therapy Session Note  Patient Details  Name: Andrew [name] MRN: [id number] Date of Birth: May 25, 1970  Today's Date: 06/15/2016 PT Individual Time: 1330-1430 PT Individual Time Calculation (min): 60 min   Short Term Goals: Week 1:  PT Short Term Goal 1 (Week 1): =LTG due to estimated LOS  Skilled Therapeutic Interventions/Progress Updates:    Pt up in room upon arrival.  Ambulation: pt ambulating variable distances throughout unit, with intermittent use of SPC. Pt not having to use cane except when becoming fatigued. Occasional mild instability with gait but independent recovery. Able to perform quick stop and change of direction without LOB. Up/down 1 flight of steps with step-over step pattern and bilateral rails. Pt states he is getting additional rail added at home today. TRANSFERS: supervison/mod I for transfers throughout session. Able to climb on/off mat table. EXERCISE: long sitting ankle plantarflexion, eversion with tactile cues and avoidance of compensations. Clamshells with assist to full range and focus on eccentric phase. Quadraped hip extension and abduction. Seated 1 inch heelraises.  Following session, pt sitting EOB with all needs in reach.    Therapy Documentation Precautions:  Precautions Precautions: Back Spinal Brace: Other (comment) Spinal Brace Comments: no brace needed per MD order Restrictions Weight Bearing Restrictions: No   Pain:  States having mild soreness in hamstring region. Monitored during session.   See Function Navigator for Current Functional Status.   Therapy/Group: Individual Therapy  Andrew [name], PT 06/15/2016, 4:05 PM

## 2016-06-15 NOTE — Patient Care Conference (Addendum)
hInpatient RehabilitationTeam Conference and Plan of Care Update Date: 06/13/2016   Time: 2:25 PM    Patient Name: Andrew [name]      Medical Record Number: [id number]  Date of Birth: October 20, 1970 Sex: Male         Room/Bed: 4W23C/4W23C-01 Payor Info: Payor: BLUE CROSS BLUE [employer]IELD / Plan: [employer] OTHER / Product Type: *No Product type* /    Admitting Diagnosis: Cadua Equina Syndrome  Admit Date/Time:  06/12/2016  5:21 PM Admission Comments: No comment available   Primary Diagnosis:  <principal problem not specified> Principal Problem: <principal problem not specified>  Patient Active Problem List   Diagnosis Date Noted  . Cauda equina syndrome (HCC) 06/12/2016  . Acute low back pain   . Decreased sensation   . Lumbar disc herniation   . Tingling in extremities   . Neurologic gait disorder   . Post-op pain   . Neuropathic pain   . Neurogenic bowel   . Neurogenic bladder   . Benign essential HTN   . Lumbar herniated disc 06/07/2016  . Cauda equina syndrome with neurogenic bladder (HCC) 06/07/2016  . Prediabetes 08/19/2015  . Vitamin D deficiency 07/14/2015  . B12 deficiency 07/14/2015  . Obesity (BMI 30-39.9) 10/05/2014  . Lumbar disc disease with radiculopathy 10/05/2014    Expected Discharge Date: Expected Discharge Date: 06/16/16  Team Members Present: Physician leading conference: Dr. Ferman Hamming, PsyD Social Worker Present: Dossie Der, LCSW Nurse Present: Carlean Purl, RN PT Present: Karolee Stamps, Marcene Brawn, PT OT Present: Johnsie Cancel, OT SLP Present: Reuel Derby, SLP PPS Coordinator present : Tora Duck, RN, CRRN     Current Status/Progress Goal Weekly Team Focus  Medical   admitted with cauda equina syndrome. neurogenic bowel and bladder. pain controlled  improve functional mobility and bowel/bladder control  voiding trial, scheduled bowel program   Bowel/Bladder   continent of bowel and bladder  continued continence of bowel and  bladder  pt has decreased sensation to genitialia and lower extremeties/ continue improvement   Swallow/Nutrition/ Hydration             ADL's   supervision - min assist overall  Mod I overall; supervision shower transfers and bathing  functional standing balance/ endurance/ AE training, ADL/IADK re-training, d/c planning   Mobility   W bed mobility, min guard gait with RW and stairs  modI overall, S stairs   LE strengthening and NMR, activity tolerance, dynamic balance and gait training   Communication             Safety/Cognition/ Behavioral Observations            Pain   pt denies pain  continued healing without pain development  continue improvement without pain   Skin   incision to lower middle back closed with sutures/ no signs of symptoms of infection/ no drainage/ slight edema  continued healing without sign or symptoms of infection/ reduction of edema/ continue to heal without drainage  continue to monitor healing and improvement of edema    Rehab Goals Patient on target to meet rehab goals: Yes *See Care Plan and progress notes for long and short-term goals.  Barriers to Discharge: lower ext weakness and sensory loss, b/b incontinenc    Possible Resolutions to Barriers:  bowel and bladder training, NMR, adaptive techniques    Discharge Planning/Teaching Needs:  Home with wife and two children. wife able to offer intermittent assist      Team Discussion:  New eval.  Mod  ind goals overall.  Cont b/b and following back precautions.  Currently min assist with amb.  Sensory loss in feet.  Should be ok to be alone at home 8-1.    Revisions to Treatment Plan:  None   Continued Need for Acute Rehabilitation Level of Care: The patient requires daily medical management by a physician with specialized training in physical medicine and rehabilitation for the following conditions: Daily direction of a multidisciplinary physical rehabilitation program to ensure safe treatment  while eliciting the highest outcome that is of practical value to the patient.: Yes Daily medical management of patient stability for increased activity during participation in an intensive rehabilitation regime.: Yes Daily analysis of laboratory values and/or radiology reports with any subsequent need for medication adjustment of medical intervention for : Post surgical problems;Neurological problems  Aarica Wax 06/18/2016, 4:50 PM

## 2016-06-15 NOTE — Discharge Summary (Signed)
NAME:  Andrew [name], Andrew [name]                      ACCOUNT NO.:  MEDICAL RECORD NO.:  [id number]  LOCATION:                                 FACILITY:  PHYSICIAN:  Ranelle OysterZachary T. Swartz, M.D.DATE OF BIRTH:  05-Feb-1971  DATE OF ADMISSION:  06/12/2016 DATE OF DISCHARGE:  06/16/2016                              DISCHARGE SUMMARY   DISCHARGE DIAGNOSES: 1. Cauda equina syndrome with large herniated disk, status post L4-5     laminectomy, diskectomy. 2. SCDs for deep venous thrombosis prophylaxis. 3. Pain management. 4. Neurogenic bowel and bladder. 5. Hypertension.  HISTORY OF PRESENT ILLNESS:  This is a 46 year old right-handed male with history of hypertension, recent L3-4 and L4-5 laminectomy in October of 2016.  Independent prior to admission until the past 2 weeks when the patient noted increased low back pain, numbness to the lower extremities.  He lives with his wife.  He works as a Solicitorplant manager.  MRI showed herniated disk at L4-5.  Underwent L4 laminectomy, L4-5 diskectomy, microdissection, June 07, 2016, per Dr. Lovell SheehanJenkins.  Routine back precautions, no back brace required.  Hospital course, pain management.  Completed a Medrol Dosepak.  Ongoing bouts of urinary retention.  Foley catheter tube in place.  Physical and occupational therapy ongoing.  The patient was admitted for a comprehensive rehab program.  PAST MEDICAL HISTORY:  See discharge diagnoses.  SOCIAL HISTORY:  Lives with wife independent prior to admission working as a Solicitorplant manager.  FUNCTIONAL STATUS:  Upon admission to Rehab Services; minimal assist, 130 feet, rolling walker; minimal assist, stand pivot transfers; min-to- mod assist, activities of daily living.  PHYSICAL EXAMINATION:  VITAL SIGNS:  Blood pressure 144/94, pulse 85, temperature 98, respirations 20. GENERAL:  Alert male, in no acute distress, oriented x3. NECK:  Supple.  Nontender.  No JVD. CARDIAC:  Regular rate and rhythm without murmur. ABDOMEN:   Soft, nontender.  Good bowel sounds. LUNGS:  Clear to auscultation without wheeze. NEUROLOGIC:  Upper extremity strength 5/5, bilateral deltoid, biceps, triceps, and grip; 4/5, bilateral hip flexors, knee extensors, ankle dorsi, plantarflexion.  Sensation absent to light touch, peroneal area.  REHABILITATION HOSPITAL COURSE:  The patient was admitted to Inpatient Rehab Services with therapies initiated on a 3-hour daily basis consisting of physical therapy, occupational therapy and rehabilitation nursing.  The following issues were addressed during the patient's rehabilitation stay.  Pertaining to Mr. Favia, cauda equina syndrome, he had undergone L4 laminectomy, diskectomy, routine back precautions, no back brace required, he would follow up with Dr. Tressie StalkerJeffrey Jenkins of Neurosurgery.  SCDs for DVT prophylaxis.  Venous Doppler studies negative.  Pain management.  He completed a Medrol Dosepak.  He was on Neurontin 300 mg 3 times daily, Flexeril as needed as well as oxycodone. Neurogenic bowel and bladder.  Placed on low-dose Urecholine.  Foley catheter tube removed, voiding very well at the time of discharge. Bladder scan less than 100 mL, daily bowel program with digital stimulation, suppository if needed.  Blood pressures remained well controlled and monitored.  The patient received weekly collaborative interdisciplinary team conferences to discuss estimated length of stay, family teaching, any barriers to discharge.  He  was ambulating to the gym with a straight-point cane, minimal guard, working with energy conservation techniques, routine back precautions.  Standing-stepping strategy to number targets for compensatory balance strategies.  He could gather his belongings for activities of daily living and homemaking.  He was able to navigate stairs.  Full family teaching was completed.  It was discussed, no driving.  He was discharged to home.  DISCHARGE MEDICATIONS: 1. Urecholine 10  mg p.o. t.i.d., taper as directed. 2. Colace 100 mg p.o. b.i.d. 3. Neurontin 300 mg p.o. t.i.d. 4. Multivitamin daily. 5. Flexeril 10 mg t.i.d. as needed. 6. Oxycodone immediate release 5-10 mg every 3 hours as needed pain.  DIET:  Regular.  SPECIAL INSTRUCTIONS:  The patient would follow up with Dr. Faith Rogue at the Outpatient Rehab Service office as directed; Dr. Tressie Stalker, Neurosurgery, call for appointment; Dr. Bari Edward, medical management.  SPECIAL INSTRUCTIONS:  No driving.  Routine back precautions.     Mariam Dollar, P.A.   ______________________________ Ranelle Oyster, M.D.    DA/MEDQ  D:  06/14/2016  T:  06/14/2016  Job:  [id number]  cc:   Cristi Loron, M.D. Bari Edward, MD

## 2016-06-15 NOTE — Progress Notes (Signed)
Patient ID: Andrew [name], male   DOB: 05/21/1970, 46 y.o.   MRN: [id number] Subjective:  The patient is alert and pleasant. He looks well. He is looking forward to going home tomorrow. He has been urinating without a catheter.  Objective: Vital signs in last 24 hours: Temp:  [97.8 F (36.6 C)-98 F (36.7 C)] 98 F (36.7 C) (03/22 0529) Pulse Rate:  [95-96] 96 (03/22 0529) Resp:  [16-18] 16 (03/22 0529) BP: (145-161)/(89-92) 145/89 (03/22 0529) SpO2:  [100 %] 100 % (03/22 0529)  Intake/Output from previous day: 03/21 0701 - 03/22 0700 In: 960 [P.O.:960] Out: 151 [Urine:151] Intake/Output this shift: No intake/output data recorded.  Physical exam the patient is alert and pleasant. He is moving his lower extremities well. His wound is healing well.  Lab Results:  Recent Labs  06/13/16 0628  WBC 9.8  HGB 15.6  HCT 45.7  PLT 301   BMET  Recent Labs  06/13/16 0628  NA 140  K 4.6  CL 98*  CO2 32  GLUCOSE 90  BUN 17  CREATININE 0.96  CALCIUM 10.1    Studies/Results: No results found.  Assessment/Plan: Postop day #8: The patient is doing well. I gave him his discharge instructions and I'll plan to see him in the office in a couple weeks. We will plan to remove the sutures today. I have answered all his questions.  LOS: 3 days     Annelise Mccoy D 06/15/2016, 10:19 AM

## 2016-06-15 NOTE — Progress Notes (Signed)
Occupational Therapy Discharge Summary  Patient Details  Name: Petar Mucci MRN: [id number] Date of Birth: August 31, 1970   Patient has met 10 of 10 long term goals due to improved activity tolerance, improved balance, postural control and improved coordination.  Patient to discharge at overall Modified Independent level with supervision for shower and bathing.  Patient's care partner is independent to provide the necessary physical assistance at discharge.    Reasons goals not met: n/a  Recommendation:  Patient with no further OT needs at this time Equipment: [facility] and shower chair  Reasons for discharge: treatment goals met and discharge from hospital  Patient/family agrees with progress made and goals achieved: Yes   Skilled Therapeutic Interventions Pt seen seated in w/c  With no complaints of pain and agreeable to tx. Pt requests to finish eating breakfast prior to bathe and dress session. Discussed any extra questions prior to d/c about home environment and setup and suggested a chair in bathroom to sit on for shaving to conserve energy. Pt ambulates throughout room with MOD I with SPC to gather clothes and shower items and bathes seated on TTB in walk in shower with MOD I. Pt grooms and dresses (oral care, shaving, and hair care) in seated and standing at sink with MOD I. Pt ambulates to/from room to gym with supervision with 2 LOB noted, however pt able to self correct. Pt stretches hamstring on therapy mat in seated and supine with sheet with Vc to keep knee straight. Exited session with pt seated in w/c and call light in reach.  OT Discharge Precautions/Restrictions  Precautions Precautions: Back Spinal Brace Comments: no brace needed per MD order Restrictions Weight Bearing Restrictions: No Vision/Perception  Vision- History Baseline Vision/History: No visual deficits Patient Visual Report: No change from baseline  Cognition Overall Cognitive Status: Within Functional Limits for  tasks assessed Arousal/Alertness: Awake/alert Orientation Level: Oriented X4 Memory: Appears intact Awareness: Appears intact Problem Solving: Appears intact Safety/Judgment: Appears intact Sensation Sensation Light Touch: Appears Intact Proprioception: Appears Intact Coordination Gross Motor Movements are Fluid and Coordinated: No Fine Motor Movements are Fluid and Coordinated: Yes Motor  Motor Motor: Other (comment) Motor - Discharge Observations: LE parapaesis  Trunk/Postural Assessment  Cervical Assessment Cervical Assessment: Within Functional Limits Thoracic Assessment Thoracic Assessment: Exceptions to [facility] (Back pre-cautions) Lumbar Assessment Lumbar Assessment: Exceptions to WFL (Back pre-cautions) Postural Control Postural Control: Within Functional Limits  Balance Balance Balance Assessed: Yes Static Standing Balance Static Standing - Balance Support: No upper extremity supported Static Standing - Level of Assistance: 6: Modified independent (Device/Increase time) Dynamic Standing Balance Dynamic Standing - Balance Support: During functional activity Dynamic Standing - Level of Assistance: 6: Modified independent (Device/Increase time) Extremity/Trunk Assessment RUE Assessment RUE Assessment: Within Functional Limits LUE Assessment LUE Assessment: Within Functional Limits   See Function Navigator for Current Functional Status.  [name], Amy C 06/15/2016, 11:24 AM

## 2016-06-15 NOTE — IPOC Note (Signed)
Overall Plan of Care [facility](IPOC) Patient Details Name: Andrew [name] MRN: [id number] DOB: Dec 29, 1970  Admitting Diagnosis: Cadua Equina Syndrome  Hospital Problems: Active Problems:   Cauda equina syndrome (HCC)     Functional Problem List: Nursing Bladder, Bowel, Sensory, Pain, Medication Management, Motor, Skin Integrity, Edema  PT Balance, Endurance, Motor, Safety  OT Balance, Endurance, Safety  SLP    TR         Basic ADL's: OT Grooming, Bathing, Dressing, Toileting     Advanced  ADL's: OT Simple Meal Preparation, Laundry     Transfers: PT Bed Mobility, Bed to Chair, Car, Occupational psychologisturniture  OT Toilet, Research scientist (life sciences)Tub/Shower     Locomotion: PT Ambulation, Psychologist, prison and probation servicesWheelchair Mobility, Stairs     Additional Impairments: OT None  SLP        TR      Anticipated Outcomes Item Anticipated Outcome  Self Feeding mod I   Swallowing      Basic self-care  mod I   Toileting  mod I    Bathroom Transfers mod I - toilet, supervision - shower  Bowel/Bladder  Pt will manage bowel and bladder with min assist upon discharge   Transfers  modI with LRAD  Locomotion  modI gait, S stairs  Communication     Cognition     Pain  Pt will rate pain at 3 or less on a scale of 0-10   Safety/Judgment  Pt will remain free of falls and injury with min assist/cues   Therapy Plan: PT Intensity: Minimum of 1-2 x/day ,45 to 90 minutes PT Frequency: 5 out of 7 days PT Duration Estimated Length of Stay: 5-7 days OT Intensity: Minimum of 1-2 x/day, 45 to 90 minutes OT Frequency: 5 out of 7 days OT Duration/Estimated Length of Stay: 5-7 days         Team Interventions: Nursing Interventions Patient/Family Education, Bladder Management, Bowel Management, Pain Management, Skin Care/Wound Management, Medication Management, Discharge Planning  PT interventions Ambulation/gait training, Warden/rangerBalance/vestibular training, Community reintegration, Development worker, international aidunctional electrical stimulation, Patient/family education, Museum/gallery curatortair training,  UE/LE Coordination activities, UE/LE Strength taining/ROM, Splinting/orthotics, Pain management, DME/adaptive equipment instruction, Disease management/prevention, Neuromuscular re-education, Therapeutic Exercise, Wheelchair propulsion/positioning, Therapeutic Activities, Psychosocial support, Functional mobility training, Discharge planning  OT Interventions Warden/rangerBalance/vestibular training, Community reintegration, Discharge planning, Functional mobility training, Psychosocial support, Therapeutic Activities, UE/LE Coordination activities, Patient/family education, DME/adaptive equipment instruction, UE/LE Strength taining/ROM, Self Care/advanced ADL retraining, Therapeutic Exercise  SLP Interventions    TR Interventions    SW/CM Interventions Discharge Planning, Psychosocial Support, Patient/Family Education    Team Discharge Planning: Destination: PT-Home ,OT- Home , SLP-  Projected Follow-up: PT-Outpatient PT, OT-  None, SLP-  Projected Equipment Needs: PT-To be determined, OT- Tub/shower seat, 3 in 1 bedside comode, SLP-  Equipment Details: PT- , OT-  Patient/family involved in discharge planning: PT- Patient,  OT-Patient, SLP-   MD ELOS: 5-7d Medical Rehab Prognosis:  Excellent Assessment:  46 y.o.right handed malewith history of hypertension as well as recent L3-4 and 4-5 laminectomy October 2016.Per chart review and patient, patient independent prior to admission up into the past 2 weeks. He works as a Solicitorplant manager. Lives with his wife who works during the day. Two-level home with bedroom upstairs.Patient was doing well until he returned back to neurosurgery office with complaints of increasing pain as well as acute onset of bilateral leg numbness and weaknessand decreased sensation to void. MRI reviewed, showing herniated disc at L4-5. Underwent L4 laminectomy with L4-5 discectomy using microdissection 06/07/2016 per Dr. Lovell SheehanJenkins.Routine back precautions  with no brace required. Hospital  course pain management. Maintained on a Medrol dose pack 06/11/2016 10 doses. Ongoing urinary retention and a Foley catheter tube had been in place   Now requiring 24/7 Rehab RN,MD, as well as CIR level PT, OT and SLP.  Treatment team will focus on ADLs and mobility with goals set at [facility] I See Team Conference Notes for weekly updates to the plan of care

## 2016-06-15 NOTE — Progress Notes (Signed)
[place] PHYSICAL MEDICINE & REHABILITATION     PROGRESS NOTE    Subjective/Complaints: Had a good day with therapy. Happy that bowels and bladder are emptying. Encouraged by strength/ambulation  ROS: pt denies nausea, vomiting, diarrhea, cough, shortness of breath or chest pain   Objective: Vital Signs: Blood pressure (!) 145/89, pulse 96, temperature 98 F (36.7 C), temperature source Oral, resp. rate 16, height 6\' 1"  (1.854 m), weight 102.3 kg (225 lb 8.5 oz), SpO2 100 %. No results found.  Recent Labs  06/13/16 0628  WBC 9.8  HGB 15.6  HCT 45.7  PLT 301    Recent Labs  06/13/16 0628  NA 140  K 4.6  CL 98*  GLUCOSE 90  BUN 17  CREATININE 0.96  CALCIUM 10.1   CBG (last 3)  No results for input(s): GLUCAP in the last 72 hours.  Wt Readings from Last 3 Encounters:  06/12/16 102.3 kg (225 lb 8.5 oz)  06/07/16 107.8 kg (237 lb 10.5 oz)  05/10/16 109.3 kg (241 lb)    Physical Exam:  Constitutional: He is oriented to person, place, and time. He appears well-developed and well-nourished.  HENT:  Head: Normocephalic and atraumatic.  Eyes: EOMI.  Neck: Normal range of motion. Neck supple. No thyromegaly present.  Cardiovascular: RRR.   Respiratory: CTA B.  GI: soft BS+ NT/ND  Musculoskeletal: He exhibits no edema or tenderness.  Neurological: He is alert and oriented to person, place, and time.  Neurological:  Motor: 5/5 bilateral deltoid, biceps, triceps, grip 4+ to 5/5 bilateral hip flexor, knee extensor ankle dorsiflexor, 4-/5 ankle plantar flexion as well as hip abduction Sensation absent to light touch perineal area as well as soles of feet.   Skin: Skin is warm and dry. Back incision clean and dry with sutures  Psychiatric: He has a normal mood and affect. His behavior is normal. Thought content normal.   Assessment/Plan: 1. Functional deficits/paraparesis secondary to cauda equina syndrome which require 3+ hours per day of interdisciplinary therapy  in a comprehensive inpatient rehab setting. Physiatrist is providing close team supervision and 24 hour management of active medical problems listed below. Physiatrist and rehab team continue to assess barriers to discharge/monitor patient progress toward functional and medical goals.  Function:  Bathing Bathing position   Position: Shower  Bathing parts Body parts bathed by patient: Right arm, Left arm, Chest, Abdomen, Front perineal area, Buttocks, Right upper leg, Left upper leg, Right lower leg, Left lower leg, Back    Bathing assist Assist Level: Supervision or verbal cues      Upper Body Dressing/Undressing Upper body dressing   What is the patient wearing?: Pull over shirt/dress     Pull over shirt/dress - Perfomed by patient: Thread/unthread right sleeve, Thread/unthread left sleeve, Put head through opening, Pull shirt over trunk          Upper body assist Assist Level: Set up, Supervision or verbal cues   Set up : To obtain clothing/put away  Lower Body Dressing/Undressing Lower body dressing   What is the patient wearing?: Underwear, Pants, Socks, Shoes Underwear - Performed by patient: Thread/unthread right underwear leg, Thread/unthread left underwear leg, Pull underwear up/down   Pants- Performed by patient: Thread/unthread right pants leg, Thread/unthread left pants leg, Pull pants up/down       Socks - Performed by patient: Don/doff right sock, Don/doff left sock   Shoes - Performed by patient: Don/doff right shoe, Don/doff left shoe, Fasten right, Fasten left  Lower body assist Assist for lower body dressing: Set up, Supervision or verbal cues      Toileting Toileting Toileting activity did not occur: No continent bowel/bladder event Toileting steps completed by patient: Adjust clothing prior to toileting, Performs perineal hygiene, Adjust clothing after toileting      Toileting assist Assist level: Touching or steadying assistance  (Pt.75%)   Transfers Chair/bed transfer   Chair/bed transfer method: Stand pivot Chair/bed transfer assist level: Touching or steadying assistance (Pt > 75%) Chair/bed transfer assistive device: Armrests, Bedrails     Locomotion Ambulation     Max distance: 175 Assist level: Touching or steadying assistance (Pt > 75%)   Wheelchair   Type: Manual Max wheelchair distance: 150 Assist Level: Supervision or verbal cues  Cognition Comprehension Comprehension assist level: Follows complex conversation/direction with no assist  Expression Expression assist level: Expresses complex ideas: With no assist  Social Interaction Social Interaction assist level: Interacts appropriately with others - No medications needed.  Problem Solving Problem solving assist level: Solves complex problems: Recognizes & self-corrects  Memory Memory assist level: Complete Independence: No helper   Medical Problem List and Plan: 1.  Decreased functional mobility secondary to cauda equina syndrome with large herniated disc L4-5.S/P L4 laminectomy with discectomy 06/07/2016  -continue therapies. Pt doing extremely well.  2.  DVT Prophylaxis/Anticoagulation: SCDs. Check vascular study 3. Pain Management: Complete Medrol dose pack, Neurontin 300 mg 3 times a day, Flexeril 10 mg 3 times a day as needed, oxycodone as needed  -pain minimal at this point 4. Mood: Provide emotional support 5. Neuropsych: This patient is capable of making decisions on his own behalf. 6. Skin/Wound Care: Routine skin checks 7. Fluids/Electrolytes/Nutrition: Routine I&O with follow-up chemistries reviewed and within normal limits 8. Neurogenic bowel and bladder.     -voiding well with low pvr's. Will dc urecholine  -daily bowel program with dig stim and supp if needed.      9. Hypertension. No antihypertensive medication at this time. bp persistently high--may need rx once home  -will follow up at discharge LOS (Days) 3 A FACE TO FACE  EVALUATION WAS PERFORMED  Ranelle OysterSWARTZ,Nasser Ku T, MD 06/15/2016 9:12 AM

## 2016-06-15 NOTE — Progress Notes (Signed)
Occupational Therapy Session Note  Patient Details  Name: Andrew [name] MRN: [id number] Date of Birth: 08/29/1970  Today's Date: 06/15/2016 OT Individual Time: 0901-0927 OT Individual Time Calculation (min): 26 min    Short Term Goals: Week 1:  OT Short Term Goal 1 (Week 1): STGs=LTGs secondary to estimated short LOS  Skilled Therapeutic Interventions/Progress Updates:    Pt seen seated in w/c upon arrival agreeable to tx with no complaints of pain. Pt voiced concerns about picking up items off of floor and adhering to back precautions. Educated pt on reacher use. Pt wishes to walk to give shop and purchase reacher. Pt ambulates with supervision to gift shop with one seated rest break due to fatigue with VC for path finding. Pt begins walk carrying SPC, however with fatigue uses  It for second half of walk. Pt completes money management in standing with MOD I and returns to room ambulating as stated above. Pt surprised at how fatigues he could get from long distance walking, recommended pt strategically place chairs along walking route at work if he needs rest breaks. Exited session with pt seated in w/c with all needs met and call light in reach.   Therapy Documentation Precautions:  Precautions Precautions: Back Spinal Brace: Other (comment) Spinal Brace Comments: no brace needed per MD order Restrictions Weight Bearing Restrictions: No General:   Vital Signs:  Pain:   ADL:   Exercises:   Other Treatments:    See Function Navigator for Current Functional Status.   Therapy/Group: Individual Therapy  Stephanie M Schlosser 06/15/2016, 9:32 AM 

## 2016-06-15 NOTE — Progress Notes (Signed)
Physical Therapy Session Note  Patient Details  Name: Andrew [name] Walther MRN: [id number] Date of Birth: [birth year]  Today's Date: 06/15/2016 PT Individual Time: 0730-0830 and 1430-1500 PT Individual Time Calculation (min): 60 min and 30 min (total 90 min)  Short Term Goals: Week 1:  PT Short Term Goal 1 (Week 1): =LTG due to estimated LOS  Skilled Therapeutic Interventions/Progress Updates: Tx 1: Pt received seated in w/c, c/o increased R hamstring pain 6/10 and agreeable to treatment but requests to "take it easy" today. Gait to gym with SPC and S. Performed nustep x10 min with BUE/BLE level 6 with average 60 steps/min for strengthening and aerobic endurance. Hamstring stretch on edge of mat and in supine with strap, BLE 2x1 min each position. Standing balance alternating toe taps to 6' step with no UE support; min guard and occasional LOB anteriorly which pt recovers with knee flexion. Standing balance on rocker board oriented ant/post; unable to facilitate plantaflexion. Standing with heels on 1" step, toes on floor in parallel bars with eccentric plantarflexion and anterior weight shift; use of UEs to catch balance when fatigued. Gait to return to room at end of session with SPC and S x150'. Remained seated in w/c at end of session, all needs in reach.   Tx 2: Pt received seated on EOB; denies pain and agreeable to treatment. Gait to/from gym with [facility] and S x175' per trial. Standing balance on biodex while playing catch game to facilitate ankle strategy, plantarflexion activation and righting reactions. Side stepping monster walks with light orange theraband with cues for concentric/eccentric control BLE. Walking lunges with min guard. Returned to room gait as above; remained seated in w/c at edge of bed at end of session.      Therapy Documentation Precautions:  Precautions Precautions: Back Spinal Brace: Other (comment) Spinal Brace Comments: no brace needed per MD order Restrictions Weight  Bearing Restrictions: No   See Function Navigator for Current Functional Status.   Therapy/Group: Individual Therapy  Vista Lawmanlizabeth J Tygielski 06/15/2016, 8:31 AM

## 2016-06-15 NOTE — Progress Notes (Signed)
Social Work Patient ID: Andrew [name], male   DOB: [birth year], 46 y.o.   MRN: [id number]   Met with pt yesterday to review team conference.  He was pleased with d/c end of week and requested end of day Friday which team agreed to.  Denies any concerns.  Pleased with progress.  Lylee Corrow, LCSW

## 2016-06-15 NOTE — Progress Notes (Signed)
Occupational Therapy Session Note  Patient Details  Name: Andrew [name] MRN: [id number] Date of Birth: 1970/07/21  Today's Date: 06/15/2016 OT Individual Time: 1100-1155 OT Individual Time Calculation (min): 55 min    Short Term Goals: Week 1:  OT Short Term Goal 1 (Week 1): STGs=LTGs secondary to estimated short LOS  Skilled Therapeutic Interventions/Progress Updates:    Pt seen for OT ADL bathing/dressing session. Pt on toilet upon arrival, ready for tx session once finished voiding. He ambulated throughout room with [facility] to gather clothing items, using reacher as needed to obtain items while maintaining back  pre-cautions. He ambulated within bathroom to gather towels, VCs provided for not reaching for furniture to stabilize and not using towel rod ect. for balance as increase risk of fall. He bathed seated on BSC mod I. Pt attempted standing to thread underwear/ pants on, recommended pt sit to reduce risk of fall.  He completed simulated laundry task, discussed various ways to retrieve items while maintaining spinal precautions including use of reacher and "golf ball pick up" style.  In therapy gym, completed floor transfer with supervision following demonstration and VCs throughout for maintaining of spinal precautions during transfer. Pt returned to room at end of session, all needs in reach.    Therapy Documentation Precautions:  Precautions Precautions: Back Spinal Brace: Other (comment) Spinal Brace Comments: no brace needed per MD order Restrictions Weight Bearing Restrictions: No Pain:   No/ denies pain  See Function Navigator for Current Functional Status.   Therapy/Group: Individual Therapy  [name], [name] 06/15/2016, 7:09 AM

## 2016-06-16 ENCOUNTER — Inpatient Hospital Stay (HOSPITAL_COMMUNITY): Payer: BLUE CROSS/BLUE SHIELD

## 2016-06-16 ENCOUNTER — Inpatient Hospital Stay (HOSPITAL_COMMUNITY): Payer: BLUE CROSS/BLUE SHIELD | Admitting: Physical Therapy

## 2016-06-16 MED ORDER — LISINOPRIL 10 MG PO TABS: 10.0000 mg | ORAL_TABLET | Freq: Every day | ORAL | Status: DC

## 2016-06-16 MED ORDER — LISINOPRIL 10 MG PO TABS
10.0000 mg | ORAL_TABLET | Freq: Every day | ORAL | Status: DC
  Administered 2016-06-16: 10 mg via ORAL
  Filled 2016-06-16: qty 1

## 2016-06-16 MED ORDER — SENNOSIDES-DOCUSATE SODIUM 8.6-50 MG PO TABS
1.0000 | ORAL_TABLET | Freq: Two times a day (BID) | ORAL | Status: DC
  Administered 2016-06-16: 1 via ORAL
  Filled 2016-06-16: qty 1

## 2016-06-16 MED ORDER — LISINOPRIL 10 MG PO TABS: 10.0000 mg | ORAL_TABLET | Freq: Every day | ORAL | 0 refills | Status: DC

## 2016-06-16 NOTE — Progress Notes (Signed)
  Pt discharged to home with wife and all discharge instructions

## 2016-06-16 NOTE — Progress Notes (Signed)
Physical Therapy Discharge Summary  Patient Details  Name: Andrew [name] MRN: [id number] Date of Birth: Jun 30, 1970  Today's Date: 06/16/2016 PT Individual Time: 1000-1100 PT Individual Time Calculation (min): 60 min    Patient has met 10 of 10 long term goals due to improved activity tolerance, improved balance, improved postural control, increased strength, ability to compensate for deficits, functional use of  right lower extremity and left lower extremity and improved coordination.  Patient to discharge at an ambulatory level Modified Independent; recommend S for stairs.   Patient does not require assist from caregiver for basic mobility tasks; able to direct care to wife for her to provide S for stairs. Wife unavailable for family training d/t work schedule.  Reasons goals not met: All goals met  Recommendation:  Patient will benefit from ongoing skilled PT services in outpatient setting to continue to advance safe functional mobility, address ongoing impairments in strength, coordination, balance, activity tolerance, and minimize fall risk.  Equipment: SPC  Reasons for discharge: treatment goals met and discharge from hospital  Patient/family agrees with progress made and goals achieved: Yes  PT Discharge Precautions/Restrictions Precautions Precautions: Back Spinal Brace Comments: no brace needed per MD order Restrictions Weight Bearing Restrictions: No Vital Signs Therapy Vitals Temp: 98.4 F (36.9 C) Temp Source: Oral Pulse Rate: 86 Resp: 17 BP: 134/81 Patient Position (if appropriate): Lying Oxygen Therapy SpO2: 99 % O2 Device: Not Delivered Pain Pain Assessment Pain Assessment: No/denies pain Vision/Perception    WFL Cognition Overall Cognitive Status: Within Functional Limits for tasks assessed Arousal/Alertness: Awake/alert Orientation Level: Oriented X4 Memory: Appears intact Awareness: Appears intact Problem Solving: Appears intact Safety/Judgment:  Appears intact Sensation Sensation Light Touch: Appears Intact Proprioception: Appears Intact Coordination Gross Motor Movements are Fluid and Coordinated: No Heel Shin Test: decreased excursion/speed Motor  Motor Motor: Other (comment) Motor - Discharge Observations: LE parapaesis in L4/5 distribution, L more affected than R  Mobility Bed Mobility Bed Mobility: Supine to Sit;Sit to Supine Supine to Sit: 6: Modified independent (Device/Increase time) Sit to Supine: 6: Modified independent (Device/Increase time) Transfers Transfers: Yes Sit to Stand: 6: Modified independent (Device/Increase time) Stand to Sit: 6: Modified independent (Device/Increase time) Stand Pivot Transfers: 6: Modified independent (Device/Increase time) Locomotion  Ambulation Ambulation: Yes Ambulation/Gait Assistance: 6: Modified independent (Device/Increase time) Ambulation Distance (Feet): 175 Feet Assistive device: Straight cane Gait Gait: Yes Gait Pattern: Impaired Gait Pattern: Wide base of support;Left flexed knee in stance;Right flexed knee in stance Gait velocity: decreased for age/gender norms High Level Ambulation High Level Ambulation: Head turns Head Turns: major LOB when cued to look up towards ceiling; maxA to recover balance Stairs / Additional Locomotion Stairs: Yes Stairs Assistance: 5: Supervision Stairs Assistance Details: Verbal cues for precautions/safety Stair Management Technique: Alternating pattern;Forwards;Two rails Number of Stairs: 24 Height of Stairs: 6 Ramp: 6: Modified independent (Device) Curb: 5: Building services engineer Mobility: No (pt ambulatory)  Trunk/Postural Assessment  Cervical Assessment Cervical Assessment: Within Functional Limits Thoracic Assessment Thoracic Assessment: Exceptions to [facility] (Back pre-cautions) Lumbar Assessment Lumbar Assessment: Exceptions to WFL (Back pre-cautions) Postural Control Postural Control: Within  Functional Limits  Balance Balance Balance Assessed: Yes Standardized Balance Assessment Standardized Balance Assessment: Berg Balance Test;Timed Up and Go Test;Dynamic Gait Index Berg Balance Test Sit to Stand: Able to stand  independently using hands Standing Unsupported: Able to stand 2 minutes with supervision Sitting with Back Unsupported but Feet Supported on Floor or Stool: Able to sit safely and securely 2 minutes Stand to Sit:  Sits safely with minimal use of hands Transfers: Able to transfer safely, minor use of hands Standing Unsupported with Eyes Closed: Able to stand 10 seconds with supervision Standing Ubsupported with Feet Together: Able to place feet together independently and stand for 1 minute with supervision From Standing, Reach Forward with Outstretched Arm: Reaches forward but needs supervision From Standing Position, Pick up Object from Floor: Unable to try/needs assist to keep balance From Standing Position, Turn to Look Behind Over each Shoulder: Needs supervision when turning Turn 360 Degrees: Able to turn 360 degrees safely but slowly Standing Unsupported, Alternately Place Feet on Step/Stool: Able to complete >2 steps/needs minimal assist Standing Unsupported, One Foot in Front: Able to plae foot ahead of the other independently and hold 30 seconds Standing on One Leg: Tries to lift leg/unable to hold 3 seconds but remains standing independently Total Score: 33 Dynamic Gait Index Level Surface: Mild Impairment Change in Gait Speed: Moderate Impairment Gait with Horizontal Head Turns: Moderate Impairment Gait with Vertical Head Turns: Severe Impairment Gait and Pivot Turn: Moderate Impairment Step Over Obstacle: Moderate Impairment Step Around Obstacles: Mild Impairment Steps: Mild Impairment Total Score: 10 Timed Up and Go Test TUG: Normal TUG;Cognitive TUG Normal TUG (seconds): 13 Cognitive TUG (seconds): 16 Static Sitting Balance Static Sitting -  Balance Support: Feet supported;No upper extremity supported Static Sitting - Level of Assistance: 6: Modified independent (Device/Increase time) Dynamic Sitting Balance Dynamic Sitting - Balance Support: Feet supported;No upper extremity supported Dynamic Sitting - Level of Assistance: 6: Modified independent (Device/Increase time) Static Standing Balance Static Standing - Balance Support: No upper extremity supported Static Standing - Level of Assistance: 6: Modified independent (Device/Increase time) Dynamic Standing Balance Dynamic Standing - Balance Support: During functional activity;Right upper extremity supported Dynamic Standing - Level of Assistance: 6: Modified independent (Device/Increase time) Extremity Assessment  RUE Assessment RUE Assessment: Within Functional Limits LUE Assessment LUE Assessment: Within Functional Limits RLE Assessment RLE Assessment: Exceptions to [facility] RLE Strength Right Hip Flexion: 4+/5 Right Knee Flexion: 4+/5 Right Knee Extension: 4+/5 Right Ankle Dorsiflexion: 4+/5 Right Ankle Plantar Flexion: 2/5 Right Ankle Inversion: 4/5 Right Ankle Eversion: 2/5 LLE Assessment LLE Assessment: Exceptions to [facility] LLE Strength Left Hip Flexion: 4+/5 Left Knee Flexion: 4+/5 Left Knee Extension: 4+/5 Left Ankle Dorsiflexion: 4+/5 Left Ankle Plantar Flexion: 2/5 Left Ankle Inversion: 4-/5 Left Ankle Eversion: 2/5   Skilled Therapeutic Intervention: Pt received ambulating in room with SPC; denies pain and agreeable to treatment. Assessed all mobility as above with modI overall for basic mobility tasks. Required S for stairs. Major LOB during DGI with vertical head turns; maxA to recover balance. Discussed at length implications of visual compensation for LE strength/coordination deficits, and effect of cognitive/physical dual task during ambulation increasing risk of falls; pt verbalizes understanding. Instructed pt in [place] A/B exercises in parallel bars with  modI. Returned to room with gait as above modI; remained seated in w/c at end of session, all needs in reach. Pt with no further questions/concerns regarding d/c home at this time.    See Function Navigator for Current Functional Status.  Benjiman Core Tygielski 06/16/2016, 7:53 AM

## 2016-06-16 NOTE — Progress Notes (Signed)
Andrew [name] PHYSICAL MEDICINE & REHABILITATION     PROGRESS NOTE    Subjective/Complaints: No new issues. Up with PT. Excited to go home. Perhaps a little constipated. Emptying bladder with little difficulty  ROS: pt denies nausea, vomiting, diarrhea, cough, shortness of breath or chest pain   Objective: Vital Signs: Blood pressure 134/81, pulse 86, temperature 98.4 F (36.9 C), temperature source Oral, resp. rate 17, height 6\' 1"  (1.854 m), weight 102.3 kg (225 lb 8.5 oz), SpO2 99 %. No results found. No results for input(s): WBC, HGB, HCT, PLT in the last 72 hours. No results for input(s): NA, K, CL, GLUCOSE, BUN, CREATININE, CALCIUM in the last 72 hours.  Invalid input(s): CO CBG (last 3)  No results for input(s): GLUCAP in the last 72 hours.  Wt Readings from Last 3 Encounters:  06/12/16 102.3 kg (225 lb 8.5 oz)  06/07/16 107.8 kg (237 lb 10.5 oz)  05/10/16 109.3 kg (241 lb)    Physical Exam:  Constitutional: He is oriented to person, place, and time. He appears well-developed and well-nourished.  HENT:  Head: Normocephalic and atraumatic.  Eyes: EOMI.  Neck: Normal range of motion. Neck supple. No thyromegaly present.  Cardiovascular: RRR  Respiratory: CTA B.  GI: soft BS+ NT/ND  Musculoskeletal: He exhibits no edema or tenderness.  Neurological: He is alert and oriented to person, place, and time.  Neurological:  Motor: 5/5 bilateral deltoid, biceps, triceps, grip 4+ to 5/5 bilateral hip flexor, knee extensor ankle dorsiflexor, 4-/5 ankle plantar flexion as well as hip abduction Sensation absent to light touch perineal area as well as soles of feet--some improvement but persistent.   Skin: Skin is warm and dry. Back incision clean and dry, intact  Psychiatric: He has a normal mood and affect. His behavior is normal. Thought content normal.   Assessment/Plan: 1. Functional deficits/paraparesis secondary to cauda equina syndrome which require 3+ hours per day of  interdisciplinary therapy in a comprehensive inpatient rehab setting. Physiatrist is providing close team supervision and 24 hour management of active medical problems listed below. Physiatrist and rehab team continue to assess barriers to discharge/monitor patient progress toward functional and medical goals.  Function:  Bathing Bathing position   Position: Shower  Bathing parts Body parts bathed by patient: Right arm, Left arm, Chest, Abdomen, Front perineal area, Buttocks, Right upper leg, Left upper leg, Right lower leg, Left lower leg, Back    Bathing assist Assist Level: More than reasonable time      Upper Body Dressing/Undressing Upper body dressing   What is the patient wearing?: Pull over shirt/dress     Pull over shirt/dress - Perfomed by patient: Thread/unthread right sleeve, Thread/unthread left sleeve, Put head through opening, Pull shirt over trunk          Upper body assist Assist Level: No help, No cues, Set up   Set up : To obtain clothing/put away  Lower Body Dressing/Undressing Lower body dressing   What is the patient wearing?: Underwear, Pants, Socks, Shoes Underwear - Performed by patient: Thread/unthread right underwear leg, Thread/unthread left underwear leg, Pull underwear up/down   Pants- Performed by patient: Thread/unthread right pants leg, Thread/unthread left pants leg, Pull pants up/down       Socks - Performed by patient: Don/doff right sock, Don/doff left sock   Shoes - Performed by patient: Don/doff right shoe, Don/doff left shoe, Fasten right, Fasten left            Lower body assist Assist for  lower body dressing: No Help, No cues, More than reasonable time      Toileting Toileting Toileting activity did not occur: No continent bowel/bladder event Toileting steps completed by patient: Adjust clothing prior to toileting, Performs perineal hygiene, Adjust clothing after toileting      Toileting assist Assist level: More than  reasonable time, No help/no cues   Transfers Chair/bed transfer   Chair/bed transfer method: Ambulatory Chair/bed transfer assist level: No Help, no cues, assistive device, takes more than a reasonable amount of time Chair/bed transfer assistive device: Armrests, Hospital doctor     Max distance: 250 ft Assist level: Supervision or verbal cues   Wheelchair   Type: Manual Max wheelchair distance: 150 Assist Level: Supervision or verbal cues  Cognition Comprehension Comprehension assist level: Follows complex conversation/direction with no assist  Expression Expression assist level: Expresses complex ideas: With no assist  Social Interaction Social Interaction assist level: Interacts appropriately with others - No medications needed.  Problem Solving Problem solving assist level: Solves complex problems: Recognizes & self-corrects  Memory Memory assist level: Complete Independence: No helper   Medical Problem List and Plan: 1.  Decreased functional mobility secondary to cauda equina syndrome with large herniated disc L4-5.S/P L4 laminectomy with discectomy 06/07/2016  -home today after therapies  -Patient to see MD in the office for transitional care encounter in 1-2 weeks.  2.  DVT Prophylaxis/Anticoagulation: SCDs. Dopplers negative 3. Pain Management: Complete Medrol dose pack, Neurontin 300 mg 3 times a day, Flexeril 10 mg 3 times a day as needed, oxycodone as needed  -pain minimal at this point 4. Mood: Provide emotional support 5. Neuropsych: This patient is capable of making decisions on his own behalf. 6. Skin/Wound Care: Routine skin checks 7. Fluids/Electrolytes/Nutrition: Routine I&O with follow-up chemistries reviewed and within normal limits 8. Neurogenic bowel and bladder.     -voiding well with low pvr's. Continue off urecholine  -daily bowel program with dig stim and supp if needed + senokot-s.     9. Hypertension. bp's have been persistently  high. Was on medication at home before until he lost some weight after which he was able to stop.   -resume old home medication, lisinopril 10mg  daily  LOS (Days) 4 A FACE TO FACE EVALUATION WAS PERFORMED  Ranelle Oyster, MD 06/16/2016 10:09 AM

## 2016-06-19 DIAGNOSIS — M545 Low back pain: Secondary | ICD-10-CM | POA: Diagnosis not present

## 2016-06-19 NOTE — Progress Notes (Signed)
Social Work  Discharge Note  The overall goal for the admission was met for:   Discharge location: Yes - home with wife and two daughters  Length of Stay: Yes - 4 days  Discharge activity level: Yes - modified independent  Home/community participation: Yes  Services provided included: MD, RD, PT, OT, RN, Pharmacy and SW  Financial Services: Private Insurance: [employer]  Follow-up services arranged: Outpatient: PT via [facility], DME: 3n1 commode, tub seat and cane via [place] and Patient/Family request agency HH: Requests OP at [facility], DME: NA  Comments (or additional information):  Patient/Family verbalized understanding of follow-up arrangements: Yes  Individual responsible for coordination of the follow-up plan: pt  Confirmed correct DME delivered: Monie Shere 06/19/2016    Landy Dunnavant

## 2016-06-21 ENCOUNTER — Telehealth: Payer: Self-pay | Admitting: Physical Medicine & Rehabilitation

## 2016-06-21 DIAGNOSIS — M545 Low back pain: Secondary | ICD-10-CM | POA: Diagnosis not present

## 2016-06-21 NOTE — Telephone Encounter (Signed)
Patient is wanting to return to work week of 07/03/16 but his hosp f/u is not scheduled until 07/25/16. Please call patient and advise.

## 2016-06-23 NOTE — Telephone Encounter (Signed)
If he returns to work the week of 4/9, that will be up to him and without my clearance.  I cannot "clear" him for work without a visit. If he would like to wait for a cancellation, perhaps I can see him sooner.

## 2016-06-26 DIAGNOSIS — M545 Low back pain: Secondary | ICD-10-CM | POA: Diagnosis not present

## 2016-06-26 NOTE — Telephone Encounter (Signed)
I let Mr Andrew [name] know Dr Riley Kill reply and he is fine with that.  He would like to be put on cancellation list in case something comes up before his 07/25/16 appt.

## 2016-06-28 ENCOUNTER — Encounter
Payer: BLUE CROSS/BLUE SHIELD | Attending: Physical Medicine & Rehabilitation | Admitting: Physical Medicine & Rehabilitation

## 2016-06-28 ENCOUNTER — Encounter: Payer: Self-pay | Admitting: Physical Medicine & Rehabilitation

## 2016-06-28 VITALS — BP 124/88 | HR 80 | Resp 14

## 2016-06-28 DIAGNOSIS — N319 Neuromuscular dysfunction of bladder, unspecified: Secondary | ICD-10-CM | POA: Diagnosis not present

## 2016-06-28 DIAGNOSIS — Z9889 Other specified postprocedural states: Secondary | ICD-10-CM | POA: Insufficient documentation

## 2016-06-28 DIAGNOSIS — M1990 Unspecified osteoarthritis, unspecified site: Secondary | ICD-10-CM | POA: Diagnosis not present

## 2016-06-28 DIAGNOSIS — M48061 Spinal stenosis, lumbar region without neurogenic claudication: Secondary | ICD-10-CM | POA: Diagnosis not present

## 2016-06-28 DIAGNOSIS — Z8249 Family history of ischemic heart disease and other diseases of the circulatory system: Secondary | ICD-10-CM | POA: Insufficient documentation

## 2016-06-28 DIAGNOSIS — Z807 Family history of other malignant neoplasms of lymphoid, hematopoietic and related tissues: Secondary | ICD-10-CM | POA: Diagnosis not present

## 2016-06-28 DIAGNOSIS — Z87891 Personal history of nicotine dependence: Secondary | ICD-10-CM | POA: Insufficient documentation

## 2016-06-28 DIAGNOSIS — K592 Neurogenic bowel, not elsewhere classified: Secondary | ICD-10-CM | POA: Diagnosis not present

## 2016-06-28 DIAGNOSIS — G834 Cauda equina syndrome: Secondary | ICD-10-CM | POA: Diagnosis not present

## 2016-06-28 DIAGNOSIS — I10 Essential (primary) hypertension: Secondary | ICD-10-CM | POA: Insufficient documentation

## 2016-06-28 DIAGNOSIS — G8918 Other acute postprocedural pain: Secondary | ICD-10-CM

## 2016-06-28 DIAGNOSIS — Z79899 Other long term (current) drug therapy: Secondary | ICD-10-CM | POA: Insufficient documentation

## 2016-06-28 NOTE — Patient Instructions (Signed)
BOWELS:  SENOKOT-S (LAX + SOFTNER), TAKE 2 AT BEDTIME. INCREASE UP TO 4 AT BEDTIME (ALTERNATIVE WOULD BE TWO TWICE DAILY.   WHILE YOU'RE ONLY TAKING 2 OF THE SENOKOT-S, YOU COULD TAKE A COLACE IN THE MORNING.   MIRALAX WOULD BE AN ALTERNATIVE IF SENOKOT-S IS INEFFECTIVE  ALSO MAKE SURE YOU'RE DRINKING FLUIDS AND EATING FRUITS AND VEGGIES.

## 2016-06-28 NOTE — Progress Notes (Signed)
Subjective:    Patient ID: Andrew [name], male    DOB: 04/18/1970, 46 y.o.   MRN: [id number]  HPI  This is a transitional care visit for Mr. Magnone in follow up of his cauda equina syndrome. He is making functional gains. He is working with with PT at [facility] in [place]. He is walking with a cane.   He feels that he struggles more with bowels and bladder and erectile function. He has the urge to empty but needs an enema to usually move his bowels. He has to push harder to empty his bladder as well. Despite this he's been continent of bowels and bladder. He notices that he has erections when he's stimulated cognitively but has a hard time with sensation at this point. He has noticed that he has been gaining more sensation on the right side of his penis.   He is planning on going back to work on Monday. He is able to sit down the majority of time at his job. He plans on going back for a few hours per day to start. He states that his company has been very accommodating regarding his return to work schedule  He's off all pain medications now except for gabapentin., he's having burning pain still on his heels/soles of feet which is worse when he's up for longer periods of time. He had questions about what kind of shoes he should wear.   Pain Inventory Average Pain 0 Pain Right Now 0 My pain is no pain  In the last 24 hours, has pain interfered with the following? General activity 0 Relation with others 0 Enjoyment of life 0 What TIME of day is your pain at its worst? no pain Sleep (in general) Good  Pain is worse with: no pain Pain improves with: no pain Relief from Meds: no pain  Mobility walk with assistance use a cane how many minutes can you walk? 15 ability to climb steps?  yes do you drive?  yes Do you have any goals in this area?  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  yes  Neuro/Psych bladder control  problems bowel control problems weakness numbness tingling trouble walking  Prior Studies Any changes since last visit?  no  Physicians involved in your care Any changes since last visit?  no   Family History  Problem Relation Age of Onset  . Hypertension Mother   . CAD Mother 75  . Diabetes Mother   . Cancer Brother     nonhodgkin lymphoma   Social History   Social History  . Marital status: Married    Spouse name: N/A  . Number of children: N/A  . Years of education: N/A   Occupational History  . manager    Social History Main Topics  . Smoking status: Former Smoker    Quit date: 04/28/2002  . Smokeless tobacco: Never Used  . Alcohol use 0.0 oz/week     Comment: beer q day   . Drug use: No  . Sexual activity: Not Asked   Other Topics Concern  . None   Social History Narrative  . None   Past Surgical History:  Procedure Laterality Date  . BACK SURGERY  12/26/2014   laminectomy L3-4 and L4-5  . LUMBAR LAMINECTOMY/DECOMPRESSION MICRODISCECTOMY Bilateral 01/21/2015   Procedure: LUMBAR THREE-FOUR, LUMBAR FOUR-FIVE LUMBAR LAMINECTOMY/DECOMPRESSION MICRODISCECTOMY ;  Surgeon: Tressie Stalker, MD;  Location: [facility] NEURO  ORS;  Service: Neurosurgery;  Laterality: Bilateral;  L34 L45 laminectomies and foraminotomies  . LUMBAR LAMINECTOMY/DECOMPRESSION MICRODISCECTOMY N/A 06/07/2016   Procedure: LUMBAR LAMINECTOMY/DECOMPRESSION MICRODISCECTOMY, LUMBAR FOUR - FIVE;  Surgeon: Tressie Stalker, MD;  Location: [facility] OR;  Service: Neurosurgery;  Laterality: N/A;  . none  [id number]  . TONSILLECTOMY     as a child   . WISDOM TOOTH EXTRACTION     /w bone graft - also readying for implants    Past Medical History:  Diagnosis Date  . Arthritis   . Hypertension   . Neuromuscular disorder (HCC)    lumbar spinal stenosis     BP 124/88   Pulse 80   Resp 14   SpO2 98%   Opioid Risk Score:   Fall Risk Score:  `1  Depression screen PHQ 2/9  Depression screen [facility] 2/9 06/28/2016  07/13/2015  Decreased Interest 1 0  Down, Depressed, Hopeless 1 0  PHQ - 2 Score 2 0  Altered sleeping 0 -  Tired, decreased energy 0 -  Change in appetite 0 -  Feeling bad or failure about yourself  1 -  Trouble concentrating 0 -  Moving slowly or fidgety/restless 0 -  Suicidal thoughts 0 -  PHQ-9 Score 3 -  Difficult doing work/chores Not difficult at all -    Review of Systems  HENT: Negative.   Eyes: Negative.   Respiratory: Negative.   Cardiovascular: Negative.   Gastrointestinal: Positive for constipation.  Endocrine: Negative.   Genitourinary: Positive for difficulty urinating.  Musculoskeletal: Positive for gait problem.  Skin: Negative.   Allergic/Immunologic: Negative.   Neurological: Positive for weakness and numbness.       Tingling   Hematological: Negative.   Psychiatric/Behavioral: Negative.   All other systems reviewed and are negative.      Objective:   Physical Exam  Constitutional: He is oriented to person, place, and time. He appears well-developed and well-nourished.  HENT:  Head: Normocephalic and atraumatic.  Eyes: EOMI.  Neck: Normal range of motion. Neck supple. No thyromegaly present.  Cardiovascular: RRR  Respiratory: CTA B.  GI: soft bs + Musculoskeletal: He exhibits no edema or tenderness.  Neurological: He is alert and oriented to person, place, and time.  Neurological:  Motor: 5/5 bilateral deltoid, biceps, triceps, grip 4+ to 5/5 bilateral hip flexor, knee extensor ankle dorsiflexor, 4/5 ankle plantar flexion as well as hip abduction Sensation absent to light touch perineal area/ sensation on soles of feet has improved. walks with cane. Tends to swing hips out laterally and sits on pelvis during lumbar extension.  Skin: Skin is warm and dry. Back incision healed  Psychiatric: mood is pleasant      Assessment & Plan:  Medical Problem List and Plan: 1. Decreased functional mobility secondary to cauda equina syndrome with  large herniated disc L4-5.S/P L4 laminectomy with discectomy 06/07/2016             -continue with outpt therapies at [facility] in [place]   -he plans to return to work on Monday. He will able to be seated for the large portions of the day. His boss will alow him to ease back into his role. I told him to contact me if he had any problems 2.  Pain Management: tylenol only             -pain minimal at this point  3. Neurogenic bowel and bladder.                -  reviewed options for his bowel program including senokot-s, miralax, dig stim/supp/fleet enema. He's aware that he will need to adjust and tweek the program to meet his needs. Furthermore, he should be seeing further improvement over the next few months.   -also reviewed appropriate fluid and dietary intake.                                4.  Hypertension.  Better controlled.              -continue lisinopril    Follow up in 2 months. Fifteen minutes of face to face patient care time were spent during this visit. All questions were encouraged and answered.

## 2016-06-30 DIAGNOSIS — M545 Low back pain: Secondary | ICD-10-CM | POA: Diagnosis not present

## 2016-07-03 DIAGNOSIS — M545 Low back pain: Secondary | ICD-10-CM | POA: Diagnosis not present

## 2016-07-06 DIAGNOSIS — M545 Low back pain: Secondary | ICD-10-CM | POA: Diagnosis not present

## 2016-07-10 ENCOUNTER — Telehealth: Payer: Self-pay | Admitting: *Deleted

## 2016-07-10 NOTE — Telephone Encounter (Signed)
Andrew [name] called and says that he fainted and fell yesterday because his BP was too low.  He thinks he hit his neck or his head because he is having some tingling in his pinky fingers on both hands.  Otherwise he thinks he is ok but his BP has been running low. Yesterday it was 94/57 after the fall.  Before that it was 107/68 so he thinks he needs to have it stopped. Please advise.

## 2016-07-11 DIAGNOSIS — M545 Low back pain: Secondary | ICD-10-CM | POA: Diagnosis not present

## 2016-07-12 NOTE — Telephone Encounter (Signed)
Thanks for update. By all means, stop the lisinopril if his bp is that low.

## 2016-07-12 NOTE — Telephone Encounter (Signed)
(  see previous message attached) I called back to check on Andrew [name] and he said he is doing better.  He stopped taking the lisinopril and his BP this am was 117/79 and last pm it was 121/70.  He said that the pain/tingling in his pinky is going away.

## 2016-07-13 DIAGNOSIS — M545 Low back pain: Secondary | ICD-10-CM | POA: Diagnosis not present

## 2016-07-18 DIAGNOSIS — M545 Low back pain: Secondary | ICD-10-CM | POA: Diagnosis not present

## 2016-07-21 DIAGNOSIS — M545 Low back pain: Secondary | ICD-10-CM | POA: Diagnosis not present

## 2016-07-25 ENCOUNTER — Inpatient Hospital Stay: Payer: BLUE CROSS/BLUE SHIELD | Admitting: Physical Medicine & Rehabilitation

## 2016-07-25 DIAGNOSIS — M545 Low back pain: Secondary | ICD-10-CM | POA: Diagnosis not present

## 2016-07-27 DIAGNOSIS — M545 Low back pain: Secondary | ICD-10-CM | POA: Diagnosis not present

## 2016-08-03 DIAGNOSIS — M545 Low back pain: Secondary | ICD-10-CM | POA: Diagnosis not present

## 2016-08-07 DIAGNOSIS — M545 Low back pain: Secondary | ICD-10-CM | POA: Diagnosis not present

## 2016-08-10 DIAGNOSIS — M545 Low back pain: Secondary | ICD-10-CM | POA: Diagnosis not present

## 2016-08-17 DIAGNOSIS — M545 Low back pain: Secondary | ICD-10-CM | POA: Diagnosis not present

## 2016-08-22 DIAGNOSIS — M545 Low back pain: Secondary | ICD-10-CM | POA: Diagnosis not present

## 2016-08-23 ENCOUNTER — Encounter: Payer: 59 | Admitting: Family Medicine

## 2016-08-30 ENCOUNTER — Ambulatory Visit: Payer: BLUE CROSS/BLUE SHIELD | Admitting: Physical Medicine & Rehabilitation

## 2016-09-04 ENCOUNTER — Encounter: Payer: Self-pay | Admitting: Internal Medicine

## 2016-09-04 ENCOUNTER — Ambulatory Visit (INDEPENDENT_AMBULATORY_CARE_PROVIDER_SITE_OTHER): Payer: BLUE CROSS/BLUE SHIELD | Admitting: Internal Medicine

## 2016-09-04 VITALS — BP 134/90 | HR 67 | Temp 98.1°F | Resp 17 | Ht 73.0 in | Wt 246.0 lb

## 2016-09-04 DIAGNOSIS — K592 Neurogenic bowel, not elsewhere classified: Secondary | ICD-10-CM

## 2016-09-04 DIAGNOSIS — G834 Cauda equina syndrome: Secondary | ICD-10-CM

## 2016-09-04 DIAGNOSIS — I10 Essential (primary) hypertension: Secondary | ICD-10-CM | POA: Diagnosis not present

## 2016-09-04 DIAGNOSIS — N529 Male erectile dysfunction, unspecified: Secondary | ICD-10-CM

## 2016-09-04 MED ORDER — SILDENAFIL CITRATE 100 MG PO TABS: 50.0000 mg | ORAL_TABLET | Freq: Every day | ORAL | 1 refills | Status: DC | PRN

## 2016-09-04 NOTE — Progress Notes (Signed)
Date:  09/04/2016   Name:  Andrew [name]   DOB:  [birth year]   MRN:  [id number]   Chief Complaint: Hypertension Hypertension  This is a chronic problem. The current episode started more than 1 year ago. The problem has been resolved since onset. The problem is controlled. Pertinent negatives include no chest pain, palpitations or shortness of breath. Past treatments include angiotensin blockers. The current treatment provides significant (had to stop meds due to low bp) improvement.   Cauda Equina syndrome - in March had sudden onset of worsening back pain with numbness and loss of function in both LE.  He underwent emergency surgery.  Spent 2 weeks in rehab then discharged home.  He still has residual numbness in his buttocks and feet with calf weakness.  He has to strain to urinate but this is improving.  He sometimes has fecal incontinence with straining.  He initially had significant ED that has improved about 75%.  He is wondering if he can try Viagra. He is back to work, still doing PT and has intermittent follow up with rehab MD.  Review of Systems  Constitutional: Negative for chills, fatigue and unexpected weight change.  Eyes: Negative for visual disturbance.  Respiratory: Negative for chest tightness and shortness of breath.   Cardiovascular: Negative for chest pain, palpitations and leg swelling.  Gastrointestinal: Negative for abdominal pain.  Genitourinary: Positive for difficulty urinating.  Musculoskeletal: Negative for arthralgias.  Skin: Negative for color change and rash.  Neurological: Positive for weakness and numbness.  Psychiatric/Behavioral: Negative for sleep disturbance.    Patient Active Problem List   Diagnosis Date Noted  . Cauda equina syndrome (HCC) 06/12/2016  . Acute low back pain   . Decreased sensation   . Lumbar disc herniation   . Tingling in extremities   . Neurologic gait disorder   . Post-op pain   . Neuropathic pain   . Neurogenic bowel   .  Neurogenic bladder   . Benign essential HTN   . Lumbar herniated disc 06/07/2016  . Cauda equina syndrome with neurogenic bladder (HCC) 06/07/2016  . Prediabetes 08/19/2015  . Vitamin D deficiency 07/14/2015  . B12 deficiency 07/14/2015  . Obesity (BMI 30-39.9) 10/05/2014  . Lumbar disc disease with radiculopathy 10/05/2014    Prior to Admission medications   Medication Sig Start Date End Date Taking? Authorizing Provider  gabapentin (NEURONTIN) 300 MG capsule Take 1 capsule (300 mg total) by mouth 3 (three) times daily. 06/15/16  Yes Angiulli, Mcarthur Rossettianiel J, PA-C  Multiple Vitamins-Minerals (MULTIVITAMIN ADULT PO) Take 1 tablet by mouth daily.    Yes [provider]    Allergies  Allergen Reactions  . Hydrochlorothiazide Other (See Comments)    Severe cramping    Past Surgical History:  Procedure Laterality Date  . LUMBAR LAMINECTOMY/DECOMPRESSION MICRODISCECTOMY Bilateral 01/21/2015   Procedure: LUMBAR THREE-FOUR, LUMBAR FOUR-FIVE LUMBAR LAMINECTOMY/DECOMPRESSION MICRODISCECTOMY ;  Surgeon: Tressie StalkerJeffrey Jenkins, MD;  Location: [facility];  Service: Neurosurgery;  Laterality: Bilateral;  L34 L45 laminectomies and foraminotomies  . LUMBAR LAMINECTOMY/DECOMPRESSION MICRODISCECTOMY N/A 06/07/2016   Procedure: LUMBAR LAMINECTOMY/DECOMPRESSION MICRODISCECTOMY, LUMBAR FOUR - FIVE;  Surgeon: Tressie StalkerJeffrey Jenkins, MD;  Location: [facility];  Service: Neurosurgery;  Laterality: N/A;  . none  [id number]  . TONSILLECTOMY     as a child   . WISDOM TOOTH EXTRACTION     /w bone graft - also readying for implants     Social History  Substance Use Topics  . Smoking status: Former  Smoker    Quit date: 04/28/2002  . Smokeless tobacco: Never Used  . Alcohol use 0.0 oz/week     Comment: beer q day      Medication list has been reviewed and updated.   Physical Exam  Constitutional: He is oriented to person, place, and time. He appears well-developed. No distress.  HENT:  Head: Normocephalic and  atraumatic.  Pulmonary/Chest: Effort normal. No respiratory distress.  Musculoskeletal: He exhibits no edema.  Neurological: He is alert and oriented to person, place, and time.  Skin: Skin is warm and dry. No rash noted.  Psychiatric: He has a normal mood and affect. His behavior is normal. Thought content normal.  Nursing note and vitals reviewed.   BP 134/90 (BP Location: Right Arm, Patient Position: Sitting, Cuff Size: Large)   Pulse 67   Temp 98.1 F (36.7 C) (Oral)   Resp 17   Ht 6\' 1"  (1.854 m)   Wt 246 lb (111.6 kg)   SpO2 99%   BMI 32.46 kg/m   Assessment and Plan: 1. Benign essential HTN Stable off of medication Continue to monitor at home and if increasing, will need to restart lower dose medication  2. Cauda equina syndrome with neurogenic bladder [facility]) S/p emergency laminectomy Improving gradually  3. Neurogenic bowel Continue current sx management  4. Erectile dysfunction, unspecified erectile dysfunction type Trial of viagra - recommend 25 mg starting dose   Meds ordered this encounter  Medications  . sildenafil (VIAGRA) 100 MG tablet    Sig: Take 0.5-1 tablets (50-100 mg total) by mouth daily as needed for erectile dysfunction.    Dispense:  6 tablet    Refill:  1    Bari Edward, MD [facility]  09/04/2016

## 2016-09-04 NOTE — Patient Instructions (Signed)
Viagra - take 30 minutes before planned sexual activity.  The medication is in your system and ready to work for 4 hours.   Be careful as this medication can cause light headedness.

## 2016-09-05 ENCOUNTER — Encounter: Payer: 59 | Admitting: Internal Medicine

## 2016-09-06 ENCOUNTER — Other Ambulatory Visit: Payer: Self-pay | Admitting: Internal Medicine

## 2016-09-06 ENCOUNTER — Telehealth: Payer: Self-pay | Admitting: *Deleted

## 2016-09-06 ENCOUNTER — Encounter
Payer: BLUE CROSS/BLUE SHIELD | Attending: Physical Medicine & Rehabilitation | Admitting: Physical Medicine & Rehabilitation

## 2016-09-06 ENCOUNTER — Encounter: Payer: Self-pay | Admitting: Physical Medicine & Rehabilitation

## 2016-09-06 VITALS — BP 133/89 | HR 65

## 2016-09-06 DIAGNOSIS — E538 Deficiency of other specified B group vitamins: Secondary | ICD-10-CM | POA: Diagnosis not present

## 2016-09-06 DIAGNOSIS — G834 Cauda equina syndrome: Secondary | ICD-10-CM

## 2016-09-06 DIAGNOSIS — Z8249 Family history of ischemic heart disease and other diseases of the circulatory system: Secondary | ICD-10-CM | POA: Insufficient documentation

## 2016-09-06 DIAGNOSIS — M1990 Unspecified osteoarthritis, unspecified site: Secondary | ICD-10-CM | POA: Diagnosis not present

## 2016-09-06 DIAGNOSIS — Z79899 Other long term (current) drug therapy: Secondary | ICD-10-CM | POA: Diagnosis not present

## 2016-09-06 DIAGNOSIS — Z87891 Personal history of nicotine dependence: Secondary | ICD-10-CM | POA: Diagnosis not present

## 2016-09-06 DIAGNOSIS — I10 Essential (primary) hypertension: Secondary | ICD-10-CM | POA: Diagnosis not present

## 2016-09-06 DIAGNOSIS — Z9889 Other specified postprocedural states: Secondary | ICD-10-CM | POA: Insufficient documentation

## 2016-09-06 DIAGNOSIS — Z807 Family history of other malignant neoplasms of lymphoid, hematopoietic and related tissues: Secondary | ICD-10-CM | POA: Insufficient documentation

## 2016-09-06 DIAGNOSIS — M48061 Spinal stenosis, lumbar region without neurogenic claudication: Secondary | ICD-10-CM | POA: Diagnosis not present

## 2016-09-06 MED ORDER — SILDENAFIL CITRATE 20 MG PO TABS: 20.0000 mg | ORAL_TABLET | Freq: Every day | ORAL | 5 refills | Status: DC | PRN

## 2016-09-06 NOTE — Progress Notes (Signed)
Subjective:    Patient ID: Andrew [name], male    DOB: June 16, 1970, 46 y.o.   MRN: [id number]  HPI  Mr. Etsitty is here in follow up of his cauda equina. He is working without any isues. He is increasing his activities while he's worked with PT at [employer]. He started yoga yesterday for the first time and it went well. Sensory function is still altered but improving to.   He continues to improve daily. He is off stool softners as of a week ago, although he may have to empty a few times a day. His urinary flow is improving as well as his sexual activity. His erections are not full however yet.    Pain Inventory Average Pain 0 Pain Right Now 0 My pain is na  In the last 24 hours, has pain interfered with the following? General activity 3 Relation with others 2 Enjoyment of life 6 What TIME of day is your pain at its worst? evening Sleep (in general) Good  Pain is worse with: walking and standing Pain improves with: na Relief from Meds: 8  Mobility walk without assistance ability to climb steps?  yes do you drive?  yes  Function employed # of hrs/week 40  Neuro/Psych bladder control problems bowel control problems weakness numbness tingling trouble walking  Prior Studies Any changes since last visit?  no  Physicians involved in your care Any changes since last visit?  no   Family History  Problem Relation Age of Onset  . Hypertension Mother   . CAD Mother 56  . Diabetes Mother   . Cancer Brother        nonhodgkin lymphoma   Social History   Social History  . Marital status: Married    Spouse name: N/A  . Number of children: N/A  . Years of education: N/A   Occupational History  . manager    Social History Main Topics  . Smoking status: Former Smoker    Quit date: 04/28/2002  . Smokeless tobacco: Never Used  . Alcohol use 0.0 oz/week     Comment: beer q day   . Drug use: No  . Sexual activity: Not on file   Other Topics Concern  . Not on file    Social History Narrative  . No narrative on file   Past Surgical History:  Procedure Laterality Date  . LUMBAR LAMINECTOMY/DECOMPRESSION MICRODISCECTOMY Bilateral 01/21/2015   Procedure: LUMBAR THREE-FOUR, LUMBAR FOUR-FIVE LUMBAR LAMINECTOMY/DECOMPRESSION MICRODISCECTOMY ;  Surgeon: Tressie Stalker, MD;  Location: [facility];  Service: Neurosurgery;  Laterality: Bilateral;  L34 L45 laminectomies and foraminotomies  . LUMBAR LAMINECTOMY/DECOMPRESSION MICRODISCECTOMY N/A 06/07/2016   Procedure: LUMBAR LAMINECTOMY/DECOMPRESSION MICRODISCECTOMY, LUMBAR FOUR - FIVE;  Surgeon: Tressie Stalker, MD;  Location: [facility];  Service: Neurosurgery;  Laterality: N/A;  . none  [id number]  . TONSILLECTOMY     as a child   . WISDOM TOOTH EXTRACTION     /w bone graft - also readying for implants    Past Medical History:  Diagnosis Date  . Arthritis   . Hypertension   . Neuromuscular disorder (HCC)    lumbar spinal stenosis     There were no vitals taken for this visit.  Opioid Risk Score:   Fall Risk Score:  `1  Depression screen PHQ 2/9  Depression screen [facility] 2/9 06/28/2016 07/13/2015  Decreased Interest 1 0  Down, Depressed, Hopeless 1 0  PHQ - 2 Score 2 0  Altered sleeping 0 -  Tired,  decreased energy 0 -  Change in appetite 0 -  Feeling bad or failure about yourself  1 -  Trouble concentrating 0 -  Moving slowly or fidgety/restless 0 -  Suicidal thoughts 0 -  PHQ-9 Score 3 -  Difficult doing work/chores Not difficult at all -    Review of Systems  Constitutional: Negative.   HENT: Negative.   Eyes: Negative.   Respiratory: Negative.   Cardiovascular: Negative.   Gastrointestinal: Negative.   Endocrine: Negative.   Genitourinary: Negative.   Musculoskeletal: Negative.   Skin: Negative.   Allergic/Immunologic: Negative.   Neurological: Negative.   Hematological: Negative.   Psychiatric/Behavioral: Negative.   All other systems reviewed and are negative.      Objective:    Physical Exam  Constitutional: He is oriented to person, place, and time. He appears well-developed and well-nourished.  HENT:  Head: Normocephalic and atraumatic.  Eyes: EOMI.  Neck: Normal range of motion. Neck supple. No thyromegaly present.  Cardiovascular: RRR  Respiratory: CTA B.  GI: soft bs + Musculoskeletal: He exhibits no edema or tenderness.  Neurological: He is alert and oriented to person, place, and time.  Neurological:  Motor: 5/5 bilateral deltoid, biceps, triceps, grip 4+ to 5/5 bilateral hip flexor, knee extensor ankle dorsiflexor, 4 to 4+/5 ankle plantar flexion as well as hip abduction Sensation decreased to light touch perineal area/ sensation on soles of feet. walks with slight posterior lean. Improved leg swing. No lateral leg whipping today. Knees hyper flexed during stance. Skin: Skin is warm and dry.  Psychiatric: mood is pleasant      Assessment & Plan:  Medical Problem List and Plan: 1. Decreased functional mobility secondary to cauda equina syndrome with large herniated disc L4-5.S/P L4 laminectomy with discectomy 06/07/2016 -continue with out pt PT  -reviewed strategies for gait today, including use of a mirror/video/etc 2.  Pain Management: gabapentin continue at 300mg  BID -pain minimal at this point but still needs some coverage for dysesthesias  3. Neurogenic bowel and bladder.   -reviewed techniques for better evacuation of stool. Dig stim while on toilet?    4.  Hypertension.  -continue off lisinopril 10mg   5. Sexual dysfunction  -should improve also   -viagra prn   Follow up in 2 months. Fifteen minutes of face to face patient care time were spent during this visit. All questions were encouraged and answered.

## 2016-09-06 NOTE — Patient Instructions (Signed)
PLEASE FEEL FREE TO CALL OUR OFFICE WITH ANY PROBLEMS OR QUESTIONS [phone])    CONTINUE TO WORK ON YOUR GAIT MECHANICS AND FORM. USE MIRROR/VIDEO

## 2016-09-06 NOTE — Telephone Encounter (Signed)
Pt stated that the Viagra 1000 mg was $100.00. Pharmacy suggested generic brand of 20 mg which is only $20.00. [facility] [place].

## 2016-09-08 DIAGNOSIS — M545 Low back pain: Secondary | ICD-10-CM | POA: Diagnosis not present

## 2016-10-02 DIAGNOSIS — M545 Low back pain: Secondary | ICD-10-CM | POA: Diagnosis not present

## 2016-10-05 DIAGNOSIS — M545 Low back pain: Secondary | ICD-10-CM | POA: Diagnosis not present

## 2016-10-10 DIAGNOSIS — M545 Low back pain: Secondary | ICD-10-CM | POA: Diagnosis not present

## 2016-10-19 DIAGNOSIS — M545 Low back pain: Secondary | ICD-10-CM | POA: Diagnosis not present

## 2016-10-23 DIAGNOSIS — M545 Low back pain: Secondary | ICD-10-CM | POA: Diagnosis not present

## 2016-12-04 ENCOUNTER — Encounter: Payer: BLUE CROSS/BLUE SHIELD | Admitting: Physical Medicine & Rehabilitation

## 2017-01-01 ENCOUNTER — Encounter
Payer: BLUE CROSS/BLUE SHIELD | Attending: Physical Medicine & Rehabilitation | Admitting: Physical Medicine & Rehabilitation

## 2017-01-01 ENCOUNTER — Encounter: Payer: Self-pay | Admitting: Physical Medicine & Rehabilitation

## 2017-01-01 VITALS — BP 136/89 | HR 65

## 2017-01-01 DIAGNOSIS — Z8249 Family history of ischemic heart disease and other diseases of the circulatory system: Secondary | ICD-10-CM | POA: Diagnosis not present

## 2017-01-01 DIAGNOSIS — M5126 Other intervertebral disc displacement, lumbar region: Secondary | ICD-10-CM | POA: Insufficient documentation

## 2017-01-01 DIAGNOSIS — M48061 Spinal stenosis, lumbar region without neurogenic claudication: Secondary | ICD-10-CM | POA: Diagnosis not present

## 2017-01-01 DIAGNOSIS — G709 Myoneural disorder, unspecified: Secondary | ICD-10-CM | POA: Insufficient documentation

## 2017-01-01 DIAGNOSIS — Z87891 Personal history of nicotine dependence: Secondary | ICD-10-CM | POA: Insufficient documentation

## 2017-01-01 DIAGNOSIS — N539 Unspecified male sexual dysfunction: Secondary | ICD-10-CM | POA: Diagnosis not present

## 2017-01-01 DIAGNOSIS — I10 Essential (primary) hypertension: Secondary | ICD-10-CM | POA: Insufficient documentation

## 2017-01-01 DIAGNOSIS — G834 Cauda equina syndrome: Secondary | ICD-10-CM | POA: Insufficient documentation

## 2017-01-01 NOTE — Patient Instructions (Signed)
PLEASE FEEL FREE TO CALL OUR OFFICE WITH ANY PROBLEMS OR QUESTIONS ([phone])      

## 2017-01-01 NOTE — Progress Notes (Signed)
Subjective:    Patient ID: Andrew [name], male    DOB: 05-Jan-1971, 46 y.o.   MRN: [id number]  HPI   Andrew [name] is here in follow up of his cauda equina syndrome. He has had improvement in his bowels and bladder. His bowels are a bit slow at times and can be a struggle.   His sensory function has been a bit more stagnant. He is improving his balance. He was out fishing the other day and his foot began to bother him after being up for awhile. He realized that his ankle was twisting and turning quite a bit.   He is working full time and doing well with it. He paces himself when he needs to.   Pain Inventory Average Pain 0 Pain Right Now 1 My pain is dull  In the last 24 hours, has pain interfered with the following? General activity 2 Relation with others 0 Enjoyment of life 2 What TIME of day is your pain at its worst? morning Sleep (in general) Good  Pain is worse with: walking Pain improves with: pacing activities Relief from Meds: .  Mobility walk without assistance ability to climb steps?  yes do you drive?  yes  Function employed # of hrs/week 40 I need assistance with the following:  household duties  Neuro/Psych weakness numbness tingling  Prior Studies Any changes since last visit?  no  Physicians involved in your care Any changes since last visit?  no   Family History  Problem Relation Age of Onset  . Hypertension Mother   . CAD Mother 64  . Diabetes Mother   . Cancer Brother        nonhodgkin lymphoma   Social History   Social History  . Marital status: Married    Spouse name: N/A  . Number of children: N/A  . Years of education: N/A   Occupational History  . manager    Social History Main Topics  . Smoking status: Former Smoker    Quit date: 04/28/2002  . Smokeless tobacco: Never Used  . Alcohol use 0.0 oz/week     Comment: beer q day   . Drug use: No  . Sexual activity: Not Asked   Other Topics Concern  . None   Social History  Narrative  . None   Past Surgical History:  Procedure Laterality Date  . LUMBAR LAMINECTOMY/DECOMPRESSION MICRODISCECTOMY Bilateral 01/21/2015   Procedure: LUMBAR THREE-FOUR, LUMBAR FOUR-FIVE LUMBAR LAMINECTOMY/DECOMPRESSION MICRODISCECTOMY ;  Surgeon: Tressie Stalker, MD;  Location: [facility];  Service: Neurosurgery;  Laterality: Bilateral;  L34 L45 laminectomies and foraminotomies  . LUMBAR LAMINECTOMY/DECOMPRESSION MICRODISCECTOMY N/A 06/07/2016   Procedure: LUMBAR LAMINECTOMY/DECOMPRESSION MICRODISCECTOMY, LUMBAR FOUR - FIVE;  Surgeon: Tressie Stalker, MD;  Location: [facility];  Service: Neurosurgery;  Laterality: N/A;  . none  [id number]  . TONSILLECTOMY     as a child   . WISDOM TOOTH EXTRACTION     /w bone graft - also readying for implants    Past Medical History:  Diagnosis Date  . Arthritis   . Hypertension   . Neuromuscular disorder (HCC)    lumbar spinal stenosis     BP 136/89   Pulse 65   SpO2 97%   Opioid Risk Score:   Fall Risk Score:  `1  Depression screen PHQ 2/9  Depression screen [facility] 2/9 06/28/2016 07/13/2015  Decreased Interest 1 0  Down, Depressed, Hopeless 1 0  PHQ - 2 Score 2 0  Altered sleeping 0 -  Tired, decreased energy 0 -  Change in appetite 0 -  Feeling bad or failure about yourself  1 -  Trouble concentrating 0 -  Moving slowly or fidgety/restless 0 -  Suicidal thoughts 0 -  PHQ-9 Score 3 -  Difficult doing work/chores Not difficult at all -     Review of Systems  Constitutional: Negative.   HENT: Negative.   Eyes: Negative.   Respiratory: Negative.   Cardiovascular: Negative.   Gastrointestinal: Negative.   Endocrine: Negative.   Genitourinary: Negative.   Musculoskeletal: Negative.   Skin: Negative.   Allergic/Immunologic: Negative.   Neurological: Negative.   Hematological: Negative.   Psychiatric/Behavioral: Negative.   All other systems reviewed and are negative.      Objective:   Physical Exam   Constitutional: He is  oriented to person, place, and time. He appears well-developed and well-nourished.  HENT:  Head: Normocephalic and atraumatic.  Eyes: EOMI.  Neck: Normal range of motion. Neck supple. No thyromegaly present.  Cardiovascular: RRR Respiratory: CTA B.  GI: soft bs + Musculoskeletal: He exhibits no edema or tenderness.  Neurological: He is alert and oriented to person, place, and time.  Neurological:  Motor: 5/5 bilateral deltoid, biceps, triceps, grip 4+ to 5/5 bilateral hip flexor, knee extensor ankle dorsiflexor, 4 to 4+/5 ankle plantar flexion as well as hip abduction Sensation decreased to light touch perineal area/ sensation on soles of feet. walks with slight posterior lean. Improved leg swing. No lateral leg whipping today. Knees hyper flexed during stance. Skin: Skin is warm and dry.  Psychiatric: mood is pleasant    Assessment & Plan:  Medical Problem List and Plan: 1. Decreased functional mobility secondary to cauda equina syndrome with large herniated disc L4-5.S/P L4 laminectomy with discectomy 06/07/2016 -continue with out pt PT             -reviewed strategies for gait today, including use of a mirror/video/etc 2.  Pain Management: gabapentin continue at  BID -pain minimal at this point but still needs some coverage for dysesthesias  3. Neurogenic bowel and bladder.             -reviewed techniques for better evacuation of stool. Dig stim while on toilet?  4. Hypertension.  -continue off lisinopril   5. Sexual dysfunction             -should improve also                -viagra prn   Follow up in 2 months. Fifteen minutes of face to face patient care time were spent during this visit. All questions were encouraged and answered.

## 2017-02-21 ENCOUNTER — Encounter: Payer: 59 | Admitting: Internal Medicine

## 2017-03-02 ENCOUNTER — Ambulatory Visit (INDEPENDENT_AMBULATORY_CARE_PROVIDER_SITE_OTHER): Payer: BLUE CROSS/BLUE SHIELD | Admitting: Internal Medicine

## 2017-03-02 ENCOUNTER — Encounter: Payer: Self-pay | Admitting: Internal Medicine

## 2017-03-02 VITALS — BP 128/84 | HR 84 | Resp 16 | Ht 73.0 in | Wt 257.0 lb

## 2017-03-02 DIAGNOSIS — E538 Deficiency of other specified B group vitamins: Secondary | ICD-10-CM

## 2017-03-02 DIAGNOSIS — R7303 Prediabetes: Secondary | ICD-10-CM

## 2017-03-02 DIAGNOSIS — I10 Essential (primary) hypertension: Secondary | ICD-10-CM | POA: Diagnosis not present

## 2017-03-02 DIAGNOSIS — G834 Cauda equina syndrome: Secondary | ICD-10-CM | POA: Diagnosis not present

## 2017-03-02 DIAGNOSIS — Z0000 Encounter for general adult medical examination without abnormal findings: Secondary | ICD-10-CM

## 2017-03-02 DIAGNOSIS — E559 Vitamin D deficiency, unspecified: Secondary | ICD-10-CM

## 2017-03-02 DIAGNOSIS — R252 Cramp and spasm: Secondary | ICD-10-CM

## 2017-03-02 LAB — POCT URINALYSIS DIPSTICK
Bilirubin, UA: NEGATIVE
Blood, UA: NEGATIVE
Glucose, UA: NEGATIVE
Ketones, UA: NEGATIVE
Leukocytes, UA: NEGATIVE
Nitrite, UA: NEGATIVE
Protein, UA: NEGATIVE
Spec Grav, UA: 1.01 (ref 1.010–1.025)
Urobilinogen, UA: 0.2 E.U./dL
pH, UA: 6 (ref 5.0–8.0)

## 2017-03-02 NOTE — Progress Notes (Signed)
Date:  03/02/2017   Name:  Andrew [name] Govoni   DOB:  May 19, 1970   MRN:  [id number]   Chief Complaint: Annual Exam and Hypertension Andrew [name] Piacente is a 46 y.o. male who presents today for his Complete Annual Exam. He feels fairly well. He reports exercising yoga.  Soon he will start doing more challenging exercise. He reports he is sleeping fairly well.   He is slowly recovering from cauda equina syndrome.  He was recently seen by Rehab medicine.  No medication changes were made.  He has some mild difficulty with defecation.  He has mild pain and balance issues.  He continues to take gabapentin twice a day. He does have nocturnal cramps in both legs causing his ankles to invert and toes to draw up. He is working full time and paces himself.   He has not been taking blood pressure medication for a while and readings have been good.  Review of Systems  Constitutional: Negative for appetite change, chills, diaphoresis, fatigue and unexpected weight change.  HENT: Negative for hearing loss, tinnitus, trouble swallowing and voice change.   Eyes: Negative for visual disturbance.  Respiratory: Negative for choking, shortness of breath and wheezing.   Cardiovascular: Negative for chest pain, palpitations and leg swelling.  Gastrointestinal: Negative for abdominal pain, blood in stool, constipation, diarrhea and rectal pain.  Genitourinary: Positive for difficulty urinating. Negative for dysuria and frequency.  Musculoskeletal: Positive for back pain (mild) and myalgias (muscle cramps). Negative for arthralgias.  Skin: Negative for color change and rash.  Neurological: Positive for numbness (lateral sides of both feet). Negative for dizziness, tremors, syncope, weakness and headaches.  Hematological: Negative for adenopathy.  Psychiatric/Behavioral: Negative for dysphoric mood and sleep disturbance.    Patient Active Problem List   Diagnosis Date Noted  . Decreased sensation   . Lumbar disc herniation    . Tingling in extremities   . Neurologic gait disorder   . Neuropathic pain   . Neurogenic bowel   . Neurogenic bladder   . Benign essential HTN   . Cauda equina syndrome with neurogenic bladder (HCC) 06/07/2016  . Prediabetes 08/19/2015  . Vitamin D deficiency 07/14/2015  . B12 deficiency 07/14/2015  . Obesity (BMI 30-39.9) 10/05/2014  . Lumbar disc disease with radiculopathy 10/05/2014    Prior to Admission medications   Medication Sig Start Date End Date Taking? Authorizing Provider  gabapentin (NEURONTIN) 300 MG capsule Take 1 capsule (300 mg total) by mouth 3 (three) times daily. 06/15/16   Angiulli, Mcarthur Rossettianiel J, PA-C  Multiple Vitamins-Minerals (MULTIVITAMIN ADULT PO) Take 1 tablet by mouth daily.     [provider]  sildenafil (REVATIO) 20 MG tablet Take 1-2 tablets (20-40 mg total) by mouth daily as needed. 09/06/16   Reubin MilanBerglund, Kemya Shed H, MD    Allergies  Allergen Reactions  . Hydrochlorothiazide Other (See Comments)    Severe cramping    Past Surgical History:  Procedure Laterality Date  . LUMBAR LAMINECTOMY/DECOMPRESSION MICRODISCECTOMY Bilateral 01/21/2015   Procedure: LUMBAR THREE-FOUR, LUMBAR FOUR-FIVE LUMBAR LAMINECTOMY/DECOMPRESSION MICRODISCECTOMY ;  Surgeon: Tressie StalkerJeffrey Jenkins, MD;  Location: [facility];  Service: Neurosurgery;  Laterality: Bilateral;  L34 L45 laminectomies and foraminotomies  . LUMBAR LAMINECTOMY/DECOMPRESSION MICRODISCECTOMY N/A 06/07/2016   Procedure: LUMBAR LAMINECTOMY/DECOMPRESSION MICRODISCECTOMY, LUMBAR FOUR - FIVE;  Surgeon: Tressie StalkerJeffrey Jenkins, MD;  Location: [facility];  Service: Neurosurgery;  Laterality: N/A;  . none  [id number]  . TONSILLECTOMY     as a child   . WISDOM TOOTH EXTRACTION     /  w bone graft - also readying for implants     Social History   Tobacco Use  . Smoking status: Former Smoker    Last attempt to quit: 04/28/2002    Years since quitting: 14.8  . Smokeless tobacco: Never Used  Substance Use Topics  . Alcohol use:  Yes    Alcohol/week: 0.0 oz    Comment: beer q day   . Drug use: No     Medication list has been reviewed and updated.  PHQ 2/9 Scores 03/02/2017 06/28/2016 07/13/2015  PHQ - 2 Score 0 2 0  PHQ- 9 Score - 3 -    Physical Exam  Constitutional: He is oriented to person, place, and time. He appears well-developed and well-nourished.  HENT:  Head: Normocephalic.  Right Ear: Tympanic membrane, external ear and ear canal normal.  Left Ear: Tympanic membrane, external ear and ear canal normal.  Nose: Nose normal.  Mouth/Throat: Uvula is midline and oropharynx is clear and moist.  Eyes: Conjunctivae and EOM are normal. Pupils are equal, round, and reactive to light.  Neck: Normal range of motion. Neck supple. Carotid bruit is not present. No thyromegaly present.  Cardiovascular: Normal rate, regular rhythm, normal heart sounds and intact distal pulses.  Pulmonary/Chest: Effort normal and breath sounds normal. He has no wheezes. Right breast exhibits no mass. Left breast exhibits no mass.  Abdominal: Soft. Normal appearance and bowel sounds are normal. There is no hepatosplenomegaly. There is no tenderness.  Musculoskeletal: He exhibits no edema or tenderness.  Lymphadenopathy:    He has no cervical adenopathy.  Neurological: He is alert and oriented to person, place, and time. He has normal strength. He displays no atrophy and no tremor. A sensory deficit (decreased sensation lateral left foot) is present. Gait (slightly antalgic) abnormal.  Reflex Scores:      Patellar reflexes are 1+ on the right side and 1+ on the left side.      Achilles reflexes are 1+ on the right side and 1+ on the left side. Skin: Skin is warm, dry and intact.  Psychiatric: He has a normal mood and affect. His speech is normal and behavior is normal. Judgment and thought content normal.  Nursing note and vitals reviewed.   BP 128/84   Pulse 84   Resp 16   Ht 6\' 1"  (1.854 m)   Wt 257 lb (116.6 kg)   SpO2 98%    BMI 33.91 kg/m   Assessment and Plan: 1. Annual physical exam Doing well Advance exercise program and monitor weight to avoid additional gain - Lipid panel  2. Benign essential HTN Stable off of medication - CBC with Differential/Platelet - Comprehensive metabolic panel - TSH  3. Prediabetes - Hemoglobin A1c  4. Cauda equina syndrome with neurogenic bladder (HCC) Continues to improve Try an additional gabapentin at night to help with muscle cramps  5. B12 deficiency - Vitamin B12  6. Vitamin D deficiency - VITAMIN D 25 Hydroxy (Vit-D Deficiency, Fractures)   No orders of the defined types were placed in this encounter.   Partially dictated using Animal nutritionistDragon software. Any errors are unintentional.  Bari EdwardLaura Shay Jhaveri, MD [facility]  03/02/2017

## 2017-03-03 LAB — LIPID PANEL
Chol/HDL Ratio: 4.6 ratio (ref 0.0–5.0)
Cholesterol, Total: 197 mg/dL (ref 100–199)
HDL: 43 mg/dL (ref >39)
LDL Calculated: 126 mg/dL — ABNORMAL HIGH (ref 0–99)
Triglycerides: 139 mg/dL (ref 0–149)
VLDL Cholesterol Cal: 28 mg/dL (ref 5–40)

## 2017-03-03 LAB — CBC WITH DIFFERENTIAL/PLATELET
Basophils Absolute: 0 10*3/uL (ref 0.0–0.2)
Basos: 0 %
EOS (ABSOLUTE): 0.2 10*3/uL (ref 0.0–0.4)
Eos: 3 %
Hematocrit: 41.4 % (ref 37.5–51.0)
Hemoglobin: 14.6 g/dL (ref 13.0–17.7)
Immature Grans (Abs): 0 10*3/uL (ref 0.0–0.1)
Immature Granulocytes: 0 %
Lymphocytes Absolute: 2.4 10*3/uL (ref 0.7–3.1)
Lymphs: 37 %
MCH: 31.1 pg (ref 26.6–33.0)
MCHC: 35.3 g/dL (ref 31.5–35.7)
MCV: 88 fL (ref 79–97)
Monocytes Absolute: 0.5 10*3/uL (ref 0.1–0.9)
Monocytes: 9 %
Neutrophils Absolute: 3.3 10*3/uL (ref 1.4–7.0)
Neutrophils: 51 %
Platelets: 252 10*3/uL (ref 150–379)
RBC: 4.69 x10E6/uL (ref 4.14–5.80)
RDW: 14.5 % (ref 12.3–15.4)
WBC: 6.4 10*3/uL (ref 3.4–10.8)

## 2017-03-03 LAB — COMPREHENSIVE METABOLIC PANEL
ALT: 29 IU/L (ref 0–44)
AST: 30 IU/L (ref 0–40)
Albumin/Globulin Ratio: 2 (ref 1.2–2.2)
Albumin: 4.9 g/dL (ref 3.5–5.5)
Alkaline Phosphatase: 47 IU/L (ref 39–117)
BUN/Creatinine Ratio: 10 (ref 9–20)
BUN: 9 mg/dL (ref 6–24)
Bilirubin Total: 0.6 mg/dL (ref 0.0–1.2)
CO2: 27 mmol/L (ref 20–29)
Calcium: 9.8 mg/dL (ref 8.7–10.2)
Chloride: 99 mmol/L (ref 96–106)
Creatinine, Ser: 0.92 mg/dL (ref 0.76–1.27)
GFR calc Af Amer: 117 mL/min/{1.73_m2} (ref >59)
GFR calc non Af Amer: 101 mL/min/{1.73_m2} (ref >59)
Globulin, Total: 2.4 g/dL (ref 1.5–4.5)
Glucose: 85 mg/dL (ref 65–99)
Potassium: 4.5 mmol/L (ref 3.5–5.2)
Sodium: 140 mmol/L (ref 134–144)
Total Protein: 7.3 g/dL (ref 6.0–8.5)

## 2017-03-03 LAB — MAGNESIUM: Magnesium: 2.1 mg/dL (ref 1.6–2.3)

## 2017-03-03 LAB — HEMOGLOBIN A1C
Est. average glucose Bld gHb Est-mCnc: 117 mg/dL
Hgb A1c MFr Bld: 5.7 % — ABNORMAL HIGH (ref 4.8–5.6)

## 2017-03-03 LAB — VITAMIN D 25 HYDROXY (VIT D DEFICIENCY, FRACTURES): Vit D, 25-Hydroxy: 22.4 ng/mL — ABNORMAL LOW (ref 30.0–100.0)

## 2017-03-03 LAB — TSH: TSH: 1.61 u[IU]/mL (ref 0.450–4.500)

## 2017-03-03 LAB — VITAMIN B12: Vitamin B-12: 345 pg/mL (ref 232–1245)

## 2017-10-17 ENCOUNTER — Encounter: Payer: Self-pay | Admitting: Internal Medicine

## 2017-10-18 ENCOUNTER — Ambulatory Visit: Payer: BLUE CROSS/BLUE SHIELD | Admitting: Internal Medicine

## 2017-10-18 ENCOUNTER — Encounter: Payer: Self-pay | Admitting: Internal Medicine

## 2017-10-18 VITALS — BP 148/104 | HR 63 | Ht 73.0 in | Wt 271.0 lb

## 2017-10-18 DIAGNOSIS — F321 Major depressive disorder, single episode, moderate: Secondary | ICD-10-CM

## 2017-10-18 DIAGNOSIS — M5116 Intervertebral disc disorders with radiculopathy, lumbar region: Secondary | ICD-10-CM

## 2017-10-18 DIAGNOSIS — E559 Vitamin D deficiency, unspecified: Secondary | ICD-10-CM | POA: Diagnosis not present

## 2017-10-18 DIAGNOSIS — I10 Essential (primary) hypertension: Secondary | ICD-10-CM

## 2017-10-18 MED ORDER — DULOXETINE HCL 60 MG PO CPEP: 60.0000 mg | ORAL_CAPSULE | Freq: Every day | ORAL | 1 refills | Status: DC

## 2017-10-18 MED ORDER — LISINOPRIL 10 MG PO TABS: 10.0000 mg | ORAL_TABLET | Freq: Every day | ORAL | 1 refills | Status: DC

## 2017-10-18 NOTE — Patient Instructions (Signed)
Suicidal Feelings: How to Help Yourself  Suicide is the taking of one's own life. If you feel as though life is getting too tough to handle and are thinking about suicide, get help right away. To get help:  Call your local emergency services (911 in the [place]  Call a suicide hotline to speak with a trained counselor who understands how you are feeling. The following is a list of suicide hotlines in the [place]. For a list of hotlines in [place], visit www.suicide.org/hotlines/international/[place]-suicide-hotlines.html.  1-800-273-TALK (1-800-273-8255).  1-800-SUICIDE (1-800-784-2433).  1-888-628-9454. This is a hotline for Spanish speakers.  1-800-799-4TTY (1-800-799-4889). This is a hotline for TTY users.  1-866-4-U-TREVOR (1-866-488-7386). This is a hotline for lesbian, gay, bisexual, transgender, or questioning youth.  Contact a crisis center or a local suicide prevention center. To find a crisis center or suicide prevention center:  Call your local hospital, clinic, community service organization, mental health center, social service provider, or health department. Ask for assistance in connecting to a crisis center.  Visit www.suicidepreventionlifeline.org/getinvolved/locator for a list of crisis centers in the [place], or visit www.suicideprevention.[place]/thinking-about-suicide/find-a-crisis-centre for a list of centers in [place].  Visit the following websites:  National Suicide Prevention Lifeline: www.suicidepreventionlifeline.org  Hopeline: www.hopeline.com  American Foundation for Suicide Prevention: www.afsp.org  The Trevor Project (for lesbian, gay, bisexual, transgender, or questioning youth): www.thetrevorproject.org    How can I help myself feel better?  Promise yourself that you will not do anything drastic when you have suicidal feelings. Remember, there is hope. Many people have gotten through suicidal thoughts and feelings, and you will, too. You may have gotten through them before, and  this proves that you can get through them again.  Let family, friends, teachers, or counselors know how you are feeling. Try not to isolate yourself from those who care about you. Remember, they will want to help you. Talk with someone every day, even if you do not feel sociable. Face-to-face conversation is best.  Call a mental health professional and see one regularly.  Visit your primary health care provider every year.  Eat a well-balanced diet, and space your meals so you eat regularly.  Get plenty of rest.  Avoid alcohol and drugs, and remove them from your home. They will only make you feel worse.  If you are thinking of taking a lot of medicine, give your medicine to someone who can give it to you one day at a time. If you are on antidepressants and are concerned you will overdose, let your health care provider know so he or she can give you safer medicines. Ask your mental health professional about the possible side effects of any medicines you are taking.  Remove weapons, poisons, knives, and anything else that could harm you from your home.  Try to stick to routines. Follow a schedule every day. Put self-care on your schedule.  Make a list of realistic goals, and cross them off when you achieve them. Accomplishments give a sense of worth.  Wait until you are feeling better before doing the things you find difficult or unpleasant.  Exercise if you are able. You will feel better if you exercise for even a half hour each day.  Go out in the sun or into nature. This will help you recover from depression faster. If you have a favorite place to walk, go there.  Do the things that have always given you pleasure. Play your favorite music, read a good book, paint a picture, play your favorite   instrument, or do anything else that takes your mind off your depression if it is safe to do.  Keep your living space well lit.  When you are feeling well, write yourself a letter about tips and support that you can read when  you are not feeling well.  Remember that life's difficulties can be sorted out with help. Conditions can be treated. You can work on thoughts and strategies that serve you well.  This information is not intended to replace advice given to you by your health care provider. Make sure you discuss any questions you have with your health care provider.  Document Released: 09/17/2002 Document Revised: 11/10/2015 Document Reviewed: 07/08/2013  Elsevier Interactive Patient Education  2018 [employer]vier Inc[employer]

## 2017-10-18 NOTE — Progress Notes (Signed)
Date:  10/18/2017   Name:  Andrew [name]   DOB:  March 08, 1971   MRN:  [id number]   Chief Complaint: Hypertension (Been checking last few days and has been elevated. Used to have htn in past.) Hypertension  This is a recurrent problem. The problem has been gradually worsening since onset. The problem is uncontrolled. Pertinent negatives include no chest pain or headaches. Risk factors for coronary artery disease include sedentary lifestyle (gained weight and unable to exercise). Past treatments include ACE inhibitors.  Depression         This is a new problem.  The onset quality is gradual.   The problem has been gradually worsening since onset.  Associated symptoms include hopelessness, insomnia, decreased interest and sad.  Associated symptoms include no decreased concentration, no fatigue, no headaches and no suicidal ideas.     The symptoms are aggravated by social issues.  Past treatments include nothing.  Risk factors include a recent illness.   Past medical history includes chronic pain and physical disability.    Vitamin D def - low levels (22.4) on last visit.  Daily supplements recommended.  Radiculopathy - chronic but slowly improving,  He can not run or walk long distance without walking sticks for stability.  He can not stand or walk on his tip toes.  He stopped taking gabapentin because it did not seem to help his pain.  His admits to becoming more depressed over the inability to do things that he would like to do, even though he is slowly improving he does not think he will ever recover completely.   Review of Systems  Constitutional: Positive for unexpected weight change. Negative for chills and fatigue.  Eyes: Negative for visual disturbance.  Respiratory: Negative for cough, chest tightness and stridor.   Cardiovascular: Negative for chest pain.  Gastrointestinal: Negative for abdominal pain.  Genitourinary: Negative for difficulty urinating.  Musculoskeletal: Positive for  arthralgias and gait problem.  Skin: Negative for color change and rash.  Neurological: Positive for numbness. Negative for dizziness, light-headedness and headaches.  Psychiatric/Behavioral: Positive for depression, dysphoric mood and sleep disturbance. Negative for confusion, decreased concentration and suicidal ideas. The patient has insomnia. The patient is not nervous/anxious.     Patient Active Problem List   Diagnosis Date Noted  . Decreased sensation   . Lumbar disc herniation   . Tingling in extremities   . Neurologic gait disorder   . Neuropathic pain   . Neurogenic bowel   . Neurogenic bladder   . Benign essential HTN   . Cauda equina syndrome with neurogenic bladder (HCC) 06/07/2016  . Prediabetes 08/19/2015  . Vitamin D deficiency 07/14/2015  . B12 deficiency 07/14/2015  . Obesity (BMI 30-39.9) 10/05/2014  . Lumbar disc disease with radiculopathy 10/05/2014    Prior to Admission medications   Medication Sig Start Date End Date Taking? Authorizing Provider  gabapentin (NEURONTIN) 300 MG capsule Take 1 capsule (300 mg total) by mouth 3 (three) times daily. 06/15/16   Angiulli, Mcarthur Rossetti, PA-C  Multiple Vitamins-Minerals (MULTIVITAMIN ADULT PO) Take 1 tablet by mouth daily.     [provider]    Allergies  Allergen Reactions  . Hydrochlorothiazide Other (See Comments)    Severe cramping    Past Surgical History:  Procedure Laterality Date  . LUMBAR LAMINECTOMY/DECOMPRESSION MICRODISCECTOMY Bilateral 01/21/2015   Procedure: LUMBAR THREE-FOUR, LUMBAR FOUR-FIVE LUMBAR LAMINECTOMY/DECOMPRESSION MICRODISCECTOMY ;  Surgeon: Tressie Stalker, MD;  Location: [facility];  Service: Neurosurgery;  Laterality:  Bilateral;  L34 L45 laminectomies and foraminotomies  . LUMBAR LAMINECTOMY/DECOMPRESSION MICRODISCECTOMY N/A 06/07/2016   Procedure: LUMBAR LAMINECTOMY/DECOMPRESSION MICRODISCECTOMY, LUMBAR FOUR - FIVE;  Surgeon: Tressie StalkerJeffrey Jenkins, MD;  Location: [facility];  Service:  Neurosurgery;  Laterality: N/A;  . none  [id number]  . TONSILLECTOMY     as a child   . WISDOM TOOTH EXTRACTION     /w bone graft - also readying for implants     Social History   Tobacco Use  . Smoking status: Former Smoker    Last attempt to quit: 04/28/2002    Years since quitting: 15.4  . Smokeless tobacco: Never Used  Substance Use Topics  . Alcohol use: Yes    Alcohol/week: 0.0 oz    Comment: beer q day   . Drug use: No     Medication list has been reviewed and updated.  Current Meds  Medication Sig  . Multiple Vitamins-Minerals (MULTIVITAMIN ADULT PO) Take 1 tablet by mouth daily.     PHQ 2/9 Scores 03/02/2017 06/28/2016 07/13/2015  PHQ - 2 Score 0 2 0  PHQ- 9 Score - 3 -    Physical Exam  Constitutional: He is oriented to person, place, and time. He appears well-developed. No distress.  HENT:  Head: Normocephalic and atraumatic.  Neck: Normal range of motion. Neck supple.  Cardiovascular: Normal rate, regular rhythm and normal heart sounds.  Pulmonary/Chest: Effort normal and breath sounds normal. No respiratory distress.  Neurological: He is alert and oriented to person, place, and time.  Limited plantar flexion bilaterally  Skin: Skin is warm and dry. No rash noted.  Psychiatric: His speech is normal and behavior is normal. Judgment and thought content normal. Cognition and memory are normal. He exhibits a depressed mood. He expresses no suicidal ideation. He expresses no suicidal plans.  Nursing note and vitals reviewed.   BP (!) 148/104   Pulse 63   Ht 6\' 1"  (1.854 m)   Wt 271 lb (122.9 kg)   SpO2 98%   BMI 35.75 kg/m   Assessment and Plan: 1. Benign essential HTN Need to resume medication - lisinopril (PRINIVIL,ZESTRIL) 10 MG tablet; Take 1 tablet (10 mg total) by mouth daily.  Dispense: 30 tablet; Refill: 1  2. Vitamin D deficiency supplemented  3. Current moderate episode of major depressive disorder without prior episode (HCC) Pt is having  progressive depressive sx despite slow improvement in physical disability  Will start cymbalta with hopes of improving both mood and pain Close follow up in one month - DULoxetine (CYMBALTA) 60 MG capsule; Take 1 capsule (60 mg total) by mouth daily.  Dispense: 30 capsule; Refill: 1  4. Lumbar disc disease with radiculopathy - DULoxetine (CYMBALTA) 60 MG capsule; Take 1 capsule (60 mg total) by mouth daily.  Dispense: 30 capsule; Refill: 1   Meds ordered this encounter  Medications  . DULoxetine (CYMBALTA) 60 MG capsule    Sig: Take 1 capsule (60 mg total) by mouth daily.    Dispense:  30 capsule    Refill:  1  . lisinopril (PRINIVIL,ZESTRIL) 10 MG tablet    Sig: Take 1 tablet (10 mg total) by mouth daily.    Dispense:  30 tablet    Refill:  1    Partially dictated using Animal nutritionistDragon software. Any errors are unintentional.  Bari EdwardLaura Khing Belcher, MD [facility]  10/18/2017

## 2017-11-21 ENCOUNTER — Ambulatory Visit: Payer: BLUE CROSS/BLUE SHIELD | Admitting: Internal Medicine

## 2017-11-21 ENCOUNTER — Encounter: Payer: Self-pay | Admitting: Internal Medicine

## 2017-11-21 VITALS — BP 130/68 | HR 64 | Ht 73.0 in | Wt 273.0 lb

## 2017-11-21 DIAGNOSIS — R7303 Prediabetes: Secondary | ICD-10-CM | POA: Diagnosis not present

## 2017-11-21 DIAGNOSIS — F321 Major depressive disorder, single episode, moderate: Secondary | ICD-10-CM | POA: Diagnosis not present

## 2017-11-21 DIAGNOSIS — R269 Unspecified abnormalities of gait and mobility: Secondary | ICD-10-CM

## 2017-11-21 DIAGNOSIS — I10 Essential (primary) hypertension: Secondary | ICD-10-CM | POA: Diagnosis not present

## 2017-11-21 DIAGNOSIS — G834 Cauda equina syndrome: Secondary | ICD-10-CM

## 2017-11-21 NOTE — Progress Notes (Signed)
Date:  11/21/2017   Name:  Andrew [name]   DOB:  11-15-1970   MRN:  [id number]   Chief Complaint: Hypertension and Depression (PHQ9- 0) Depression         This is a new problem.  The current episode started more than 1 month ago.   The problem has been gradually improving since onset.  Associated symptoms include no decreased concentration, no fatigue, no headaches and no suicidal ideas.  Treatments tried: did not start meds - started exercising and taking gabapentin again. Hypertension  Pertinent negatives include no chest pain or headaches.   Cauda equina syndrome - he is now exercising more and using gabapentin for muscle tightness and pain.  He feels is making some progress but still disappointed in ongoing peri -rectal and groin numbness, inability to plantar flex his feet and walk on tip toe, and his loss of balance.  He would like to see if there is someone who specializes in the problems who might help him.    Review of Systems  Constitutional: Negative for chills and fatigue.  Eyes: Negative for visual disturbance.  Respiratory: Negative for cough, chest tightness and stridor.   Cardiovascular: Negative for chest pain.  Gastrointestinal: Negative for abdominal pain.  Genitourinary: Negative for difficulty urinating.  Musculoskeletal: Positive for arthralgias and gait problem.  Skin: Negative for color change and rash.  Neurological: Positive for numbness. Negative for dizziness, light-headedness and headaches.  Psychiatric/Behavioral: Positive for depression, dysphoric mood and sleep disturbance. Negative for confusion, decreased concentration and suicidal ideas. The patient is not nervous/anxious.     Patient Active Problem List   Diagnosis Date Noted  . Current moderate episode of major depressive disorder without prior episode (HCC) 11/21/2017  . Decreased sensation   . Lumbar disc herniation   . Tingling in extremities   . Neurologic gait disorder   . Neuropathic pain    . Neurogenic bowel   . Neurogenic bladder   . Benign essential HTN   . Cauda equina syndrome with neurogenic bladder (HCC) 06/07/2016  . Prediabetes 08/19/2015  . Vitamin D deficiency 07/14/2015  . B12 deficiency 07/14/2015  . Obesity (BMI 30-39.9) 10/05/2014  . Lumbar disc disease with radiculopathy 10/05/2014    Allergies  Allergen Reactions  . Hydrochlorothiazide Other (See Comments)    Severe cramping    Past Surgical History:  Procedure Laterality Date  . LUMBAR LAMINECTOMY/DECOMPRESSION MICRODISCECTOMY Bilateral 01/21/2015   Procedure: LUMBAR THREE-FOUR, LUMBAR FOUR-FIVE LUMBAR LAMINECTOMY/DECOMPRESSION MICRODISCECTOMY ;  Surgeon: Tressie StalkerJeffrey Jenkins, MD;  Location: [facility];  Service: Neurosurgery;  Laterality: Bilateral;  L34 L45 laminectomies and foraminotomies  . LUMBAR LAMINECTOMY/DECOMPRESSION MICRODISCECTOMY N/A 06/07/2016   Procedure: LUMBAR LAMINECTOMY/DECOMPRESSION MICRODISCECTOMY, LUMBAR FOUR - FIVE;  Surgeon: Tressie StalkerJeffrey Jenkins, MD;  Location: [facility];  Service: Neurosurgery;  Laterality: N/A;  . none  [id number]  . TONSILLECTOMY     as a child   . WISDOM TOOTH EXTRACTION     /w bone graft - also readying for implants     Social History   Tobacco Use  . Smoking status: Former Smoker    Last attempt to quit: 04/28/2002    Years since quitting: 15.5  . Smokeless tobacco: Never Used  Substance Use Topics  . Alcohol use: Yes    Alcohol/week: 0.0 standard drinks    Comment: beer q day   . Drug use: No     Medication list has been reviewed and updated.  Current Meds  Medication Sig  .  cholecalciferol (VITAMIN D) 1000 units tablet Take 2,000 Units by mouth daily.  Marland Kitchen gabapentin (NEURONTIN) 300 MG capsule Take 300 mg by mouth 2 (two) times daily.  Marland Kitchen lisinopril (PRINIVIL,ZESTRIL) 10 MG tablet Take 1 tablet (10 mg total) by mouth daily.  . Multiple Vitamins-Minerals (MULTIVITAMIN ADULT PO) Take 1 tablet by mouth daily.     PHQ 2/9 Scores 11/21/2017 10/18/2017  03/02/2017 06/28/2016  PHQ - 2 Score 0 2 0 2  PHQ- 9 Score 0 12 - 3    Physical Exam  Constitutional: He is oriented to person, place, and time. He appears well-developed. No distress.  HENT:  Head: Normocephalic and atraumatic.  Neck: Normal range of motion. Neck supple.  Cardiovascular: Normal rate, regular rhythm and normal heart sounds.  Pulmonary/Chest: Effort normal and breath sounds normal. No respiratory distress.  Musculoskeletal: He exhibits no edema.  Neurological: He is alert and oriented to person, place, and time.  Skin: Skin is warm and dry. No rash noted.  Psychiatric: He has a normal mood and affect. His behavior is normal. Thought content normal.  Nursing note and vitals reviewed.   BP 130/68 (BP Location: Right Arm, Patient Position: Sitting, Cuff Size: Normal)   Pulse 64   Ht 6\' 1"  (1.854 m)   Wt 273 lb (123.8 kg)   SpO2 99%   BMI 36.02 kg/m   Assessment and Plan: 1. Current moderate episode of major depressive disorder without prior episode (HCC) Much improved with exercise and gabapentin therapy Did not take cymbalta  2. Cauda equina syndrome with neurogenic bladder [facility]) Will send message to therapist/MD Dr. Riley Kill to see if they have any recommendations  3. Benign essential HTN controlled - Comprehensive metabolic panel - Lipid panel - TSH - CBC with Differential/Platelet  4. Neurologic gait disorder As above  5. Prediabetes Check labs Continue healthy diet and exercise - Hemoglobin A1c   No orders of the defined types were placed in this encounter.   Partially dictated using Animal nutritionist. Any errors are unintentional.  Bari Edward, MD [facility]  11/21/2017

## 2017-11-22 ENCOUNTER — Encounter: Payer: Self-pay | Admitting: Internal Medicine

## 2017-11-22 DIAGNOSIS — E785 Hyperlipidemia, unspecified: Secondary | ICD-10-CM | POA: Insufficient documentation

## 2017-11-22 LAB — CBC WITH DIFFERENTIAL/PLATELET
Basophils Absolute: 0 10*3/uL (ref 0.0–0.2)
Basos: 0 %
EOS (ABSOLUTE): 0.2 10*3/uL (ref 0.0–0.4)
Eos: 3 %
Hematocrit: 43.9 % (ref 37.5–51.0)
Hemoglobin: 14.9 g/dL (ref 13.0–17.7)
Immature Grans (Abs): 0 10*3/uL (ref 0.0–0.1)
Immature Granulocytes: 0 %
Lymphocytes Absolute: 2.7 10*3/uL (ref 0.7–3.1)
Lymphs: 35 %
MCH: 30.7 pg (ref 26.6–33.0)
MCHC: 33.9 g/dL (ref 31.5–35.7)
MCV: 90 fL (ref 79–97)
Monocytes Absolute: 0.7 10*3/uL (ref 0.1–0.9)
Monocytes: 9 %
Neutrophils Absolute: 4.2 10*3/uL (ref 1.4–7.0)
Neutrophils: 53 %
Platelets: 256 10*3/uL (ref 150–450)
RBC: 4.86 x10E6/uL (ref 4.14–5.80)
RDW: 13.5 % (ref 12.3–15.4)
WBC: 7.8 10*3/uL (ref 3.4–10.8)

## 2017-11-22 LAB — COMPREHENSIVE METABOLIC PANEL
ALT: 33 IU/L (ref 0–44)
AST: 33 IU/L (ref 0–40)
Albumin/Globulin Ratio: 1.9 (ref 1.2–2.2)
Albumin: 4.8 g/dL (ref 3.5–5.5)
Alkaline Phosphatase: 50 IU/L (ref 39–117)
BUN/Creatinine Ratio: 13 (ref 9–20)
BUN: 12 mg/dL (ref 6–24)
Bilirubin Total: 0.6 mg/dL (ref 0.0–1.2)
CO2: 28 mmol/L (ref 20–29)
Calcium: 9.8 mg/dL (ref 8.7–10.2)
Chloride: 99 mmol/L (ref 96–106)
Creatinine, Ser: 0.91 mg/dL (ref 0.76–1.27)
GFR calc Af Amer: 117 mL/min/{1.73_m2} (ref >59)
GFR calc non Af Amer: 101 mL/min/{1.73_m2} (ref >59)
Globulin, Total: 2.5 g/dL (ref 1.5–4.5)
Glucose: 85 mg/dL (ref 65–99)
Potassium: 4.8 mmol/L (ref 3.5–5.2)
Sodium: 140 mmol/L (ref 134–144)
Total Protein: 7.3 g/dL (ref 6.0–8.5)

## 2017-11-22 LAB — LIPID PANEL
Chol/HDL Ratio: 4.9 ratio (ref 0.0–5.0)
Cholesterol, Total: 212 mg/dL — ABNORMAL HIGH (ref 100–199)
HDL: 43 mg/dL (ref >39)
LDL Calculated: 146 mg/dL — ABNORMAL HIGH (ref 0–99)
Triglycerides: 117 mg/dL (ref 0–149)
VLDL Cholesterol Cal: 23 mg/dL (ref 5–40)

## 2017-11-22 LAB — HEMOGLOBIN A1C
Est. average glucose Bld gHb Est-mCnc: 120 mg/dL
Hgb A1c MFr Bld: 5.8 % — ABNORMAL HIGH (ref 4.8–5.6)

## 2017-11-22 LAB — TSH: TSH: 1.49 u[IU]/mL (ref 0.450–4.500)

## 2017-11-23 ENCOUNTER — Encounter: Payer: Self-pay | Admitting: Internal Medicine

## 2017-12-11 ENCOUNTER — Other Ambulatory Visit: Payer: Self-pay | Admitting: Internal Medicine

## 2017-12-11 DIAGNOSIS — F321 Major depressive disorder, single episode, moderate: Secondary | ICD-10-CM

## 2017-12-11 DIAGNOSIS — I10 Essential (primary) hypertension: Secondary | ICD-10-CM

## 2017-12-11 DIAGNOSIS — M5116 Intervertebral disc disorders with radiculopathy, lumbar region: Secondary | ICD-10-CM

## 2017-12-26 ENCOUNTER — Encounter
Payer: BLUE CROSS/BLUE SHIELD | Attending: Physical Medicine & Rehabilitation | Admitting: Physical Medicine & Rehabilitation

## 2017-12-26 ENCOUNTER — Encounter: Payer: Self-pay | Admitting: Physical Medicine & Rehabilitation

## 2017-12-26 ENCOUNTER — Other Ambulatory Visit: Payer: Self-pay

## 2017-12-26 VITALS — BP 143/93 | HR 73 | Ht 73.0 in | Wt 275.6 lb

## 2017-12-26 DIAGNOSIS — R252 Cramp and spasm: Secondary | ICD-10-CM | POA: Diagnosis not present

## 2017-12-26 DIAGNOSIS — I10 Essential (primary) hypertension: Secondary | ICD-10-CM | POA: Diagnosis not present

## 2017-12-26 DIAGNOSIS — Z87891 Personal history of nicotine dependence: Secondary | ICD-10-CM | POA: Diagnosis not present

## 2017-12-26 DIAGNOSIS — G834 Cauda equina syndrome: Secondary | ICD-10-CM | POA: Insufficient documentation

## 2017-12-26 DIAGNOSIS — N319 Neuromuscular dysfunction of bladder, unspecified: Secondary | ICD-10-CM | POA: Diagnosis not present

## 2017-12-26 DIAGNOSIS — K5900 Constipation, unspecified: Secondary | ICD-10-CM | POA: Insufficient documentation

## 2017-12-26 DIAGNOSIS — M48061 Spinal stenosis, lumbar region without neurogenic claudication: Secondary | ICD-10-CM | POA: Insufficient documentation

## 2017-12-26 DIAGNOSIS — Z79899 Other long term (current) drug therapy: Secondary | ICD-10-CM | POA: Diagnosis not present

## 2017-12-26 DIAGNOSIS — K592 Neurogenic bowel, not elsewhere classified: Secondary | ICD-10-CM | POA: Diagnosis not present

## 2017-12-26 DIAGNOSIS — Z5181 Encounter for therapeutic drug level monitoring: Secondary | ICD-10-CM | POA: Insufficient documentation

## 2017-12-26 NOTE — Patient Instructions (Addendum)
  IDEAS FOR BOWEL PROGRAM  1. COLACE 100MG  TWICE DAILY 2. SENOKOT-S 2 TABS AT BEDTIME, CAN INCREASE TO TWICE DAILY 3. MIRALAX 17G PER DAY

## 2017-12-26 NOTE — Progress Notes (Signed)
Subjective:    Patient ID: Andrew [name], male    DOB: 1971/03/02, 47 y.o.   MRN: [id number]  HPI   Loyal is Pattillo is here in follo wup of his cauda equina syndrome.  I was contacted by his primary provider recently that he was having some difficulties with some of the changes related to his injury.  He was hoping for some new recommendations regarding management of his problems.  Over the last year he has continued to work full-time.  He is on his feet quite a bit.  He states that walking is his biggest challenge and he usually has pain in his ankles by the end of the day and sometimes he is uncomfortable in his thighs as well given some of the accommodations he makes with his gait.  He has, however been active with exercise and strengthening and feels that he is made a lot of improvements over the last year.  He is done yoga and activities to work on flexibility and balance also.  He is not wearing any types of braces or special shoe wear currently.  Denies any falls.  When he does go out and walk outdoors on uneven surfaces for exercise he may use some walking sticks for balance which he finds helpful.  He took himself off the gabapentin for a period of time as he was not sure it was helping any longer.  He noticed however that he had more muscle tightness and spasms in general, so he resumed the medication.   He has remained continent of bowel and bladder but he's still complaining of constipation. He typically has hard, small stools which are frequent.  He denies taking any medication to soften his stools or as a laxative.  He still lacks anal rectal sensation to a large extent..   Pain Inventory Average Pain 1 Pain Right Now 1 My pain is intermittent and n/a  In the last 24 hours, has pain interfered with the following? General activity 5 Relation with others 2 Enjoyment of life 4 What TIME of day is your pain at its worst? evening Sleep (in general) Fair  Pain is worse with: walking  and standing Pain improves with: rest Relief from Meds: no meds  Mobility walk without assistance how many minutes can you walk? 30 ability to climb steps?  yes do you drive?  yes Do you have any goals in this area?  yes  Function employed # of hrs/week 50  Neuro/Psych weakness numbness tingling trouble walking depression  Prior Studies Any changes since last visit?  no  Physicians involved in your care Any changes since last visit?  no   Family History  Problem Relation Age of Onset  . Hypertension Mother   . CAD Mother 36  . Diabetes Mother   . Cancer Brother        nonhodgkin lymphoma   Social History   Socioeconomic History  . Marital status: Married    Spouse name: Not on file  . Number of children: Not on file  . Years of education: Not on file  . Highest education level: Not on file  Occupational History  . Occupation: Production designer, theatre/television/film  Social Needs  . Financial resource strain: Not on file  . Food insecurity:    Worry: Not on file    Inability: Not on file  . Transportation needs:    Medical: Not on file    Non-medical: Not on file  Tobacco Use  . Smoking status: Former  Smoker    Last attempt to quit: 04/28/2002    Years since quitting: 15.6  . Smokeless tobacco: Never Used  Substance and Sexual Activity  . Alcohol use: Yes    Alcohol/week: 0.0 standard drinks    Comment: beer q day   . Drug use: No  . Sexual activity: Not on file  Lifestyle  . Physical activity:    Days per week: Not on file    Minutes per session: Not on file  . Stress: Not on file  Relationships  . Social connections:    Talks on phone: Not on file    Gets together: Not on file    Attends religious service: Not on file    Active member of club or organization: Not on file    Attends meetings of clubs or organizations: Not on file    Relationship status: Not on file  Other Topics Concern  . Not on file  Social History Narrative  . Not on file   Past Surgical History:    Procedure Laterality Date  . LUMBAR LAMINECTOMY/DECOMPRESSION MICRODISCECTOMY Bilateral 01/21/2015   Procedure: LUMBAR THREE-FOUR, LUMBAR FOUR-FIVE LUMBAR LAMINECTOMY/DECOMPRESSION MICRODISCECTOMY ;  Surgeon: Tressie Stalker, MD;  Location: [facility];  Service: Neurosurgery;  Laterality: Bilateral;  L34 L45 laminectomies and foraminotomies  . LUMBAR LAMINECTOMY/DECOMPRESSION MICRODISCECTOMY N/A 06/07/2016   Procedure: LUMBAR LAMINECTOMY/DECOMPRESSION MICRODISCECTOMY, LUMBAR FOUR - FIVE;  Surgeon: Tressie Stalker, MD;  Location: [facility];  Service: Neurosurgery;  Laterality: N/A;  . none  [id number]  . TONSILLECTOMY     as a child   . WISDOM TOOTH EXTRACTION     /w bone graft - also readying for implants    Past Medical History:  Diagnosis Date  . Arthritis   . Hypertension   . Neuromuscular disorder (HCC)    lumbar spinal stenosis     BP (!) 143/93   Pulse 73   Ht 6\' 1"  (1.854 m)   Wt 275 lb 9.6 oz (125 kg)   SpO2 94%   BMI 36.36 kg/m   Opioid Risk Score:   Fall Risk Score:  `1  Depression screen PHQ 2/9  Depression screen [facility] 2/9 12/26/2017 11/21/2017 10/18/2017 03/02/2017 06/28/2016 07/13/2015  Decreased Interest 0 0 1 0 1 0  Down, Depressed, Hopeless 0 0 1 0 1 0  PHQ - 2 Score 0 0 2 0 2 0  Altered sleeping - 0 1 - 0 -  Tired, decreased energy - 0 3 - 0 -  Change in appetite - 0 3 - 0 -  Feeling bad or failure about yourself  - 0 3 - 1 -  Trouble concentrating - 0 0 - 0 -  Moving slowly or fidgety/restless - 0 0 - 0 -  Suicidal thoughts - 0 0 - 0 -  PHQ-9 Score - 0 12 - 3 -  Difficult doing work/chores - Not difficult at all Somewhat difficult - Not difficult at all -    Review of Systems  Constitutional: Positive for unexpected weight change.  HENT: Negative.   Eyes: Negative.   Respiratory: Negative.   Cardiovascular: Negative.   Gastrointestinal: Positive for constipation.  Endocrine: Negative.   Genitourinary: Negative.   Musculoskeletal: Negative.   Skin:  Negative.   Allergic/Immunologic: Negative.   Neurological: Negative.   Hematological: Negative.   Psychiatric/Behavioral: Negative.   All other systems reviewed and are negative.      Objective:   Physical Exam General: No acute distress HEENT: EOMI, oral membranes  moist Cards: reg rate  Chest: normal effort Abdomen: Soft, NT, ND Skin: dry, intact Extremities: no edema  Musculoskeletal: He exhibits no edema or tenderness.  Neurological: He is alert and oriented to person, place, and time.  Neurological:  Upper extremity motor is 5 out of 5.  Hip flexion is 5 out of 5, knee extension 5 out of 5, ankle dorsiflexion is 4 out of 5.  Ankle plantarflexion is 1 out of 5, knee flexion is 2-3 out of 5, hip abduction is 4 out of 5 in extension is 4 out of 5.  He still has decreased sensation in the feet and in the saddle region.   His gait has improved quite a bit.  He is much more vertical and stance.  He still tends to walk with a steppage gait to help clear the ankles.  There is very little knee hyperextension during stance however now.  He is a bit slower when changing directions.  Tandem gait is difficult.  Skin: Skin is warm and dry.  Psychiatric: mood is pleasant    Assessment & Plan:  Medical Problem List and Plan: 1. Decreased functional mobility secondary to cauda equina syndrome with large herniated disc L4-5.S/P L4 laminectomy with discectomy 06/07/2016 -Discussed motor and sensory recovery at length today with the patient.  At this point minimal natural motor recovery is occurring.  Motor and functional gains are occurring by his strength training that he continues to work on.  I stressed the importance of continuing with his efforts to improve his balance and fine motor movements in addition to strength and stamina.  He may see some mild sensory changes over the next several months but I would expect those to be inconsistent and subtle.  -We talked about  appropriate shoewear and discussed shoes that are over the malleoli that he can lace up for extra support.  I think this may help with his ankle control especially when he has to be on his feet for a while.  I will discuss with an orthotist regarding any suggestions to improve plantarflexion although typically if he try to boost his plantarflexion we may end up destabilizing him elsewhere. 2.  Pain Management: gabapentin continue at 300mg  BID -Continue as he has noticed improvement in his spasticity with the gabapentin  3. Neurogenic bowel and bladder.  -reviewed bowel meds at length today..   -He will have to be willing to take something over-the-counter as a softener/laxative to help assist the consistency of his bowel movements. 4. Hypertension.  -continue offlisinopril 10mg     Follow up as needed.  25 minutes of face to face patient care time were spent during this visit. All questions were encouraged and answered.

## 2018-02-22 IMAGING — MR MR LUMBAR SPINE WO/W CM
4 of 7 series · 18 of 48 positions shown · IV contrast (multihance)
Comparison: Preoperative MRI 12/09/2014.

CLINICAL DATA: Low back pain extending into the lower extremities
bilaterally. Numbness and tingling in both lower extremities.
Symptoms began while doing yard work 2 weeks ago. Lumbar spine
surgery 01/21/2015

EXAM:
MRI LUMBAR SPINE WITHOUT AND WITH CONTRAST
TECHNIQUE: Multiplanar and multiecho pulse sequences of the lumbar spine were
obtained without and with intravenous contrast.
CONTRAST:  20mL MULTIHANCE GADOBENATE DIMEGLUMINE 529 MG/ML IV SOLN

[Series 4: T2 · sagittal · 4.0mm · 0.59mm/px · 4 of 14 slices shown (1 of 2)]
[im 1/14]
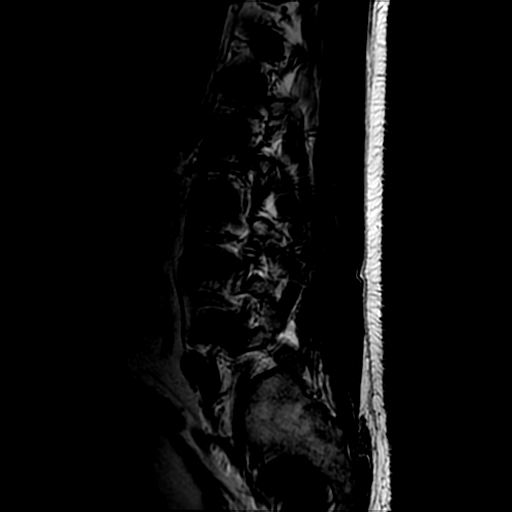
[im 5/14]
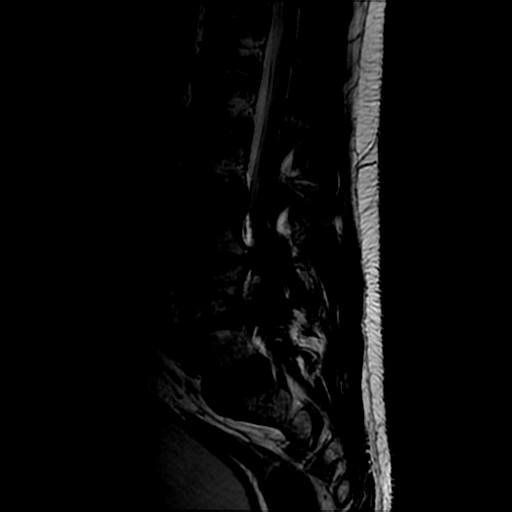
[im 9/14]
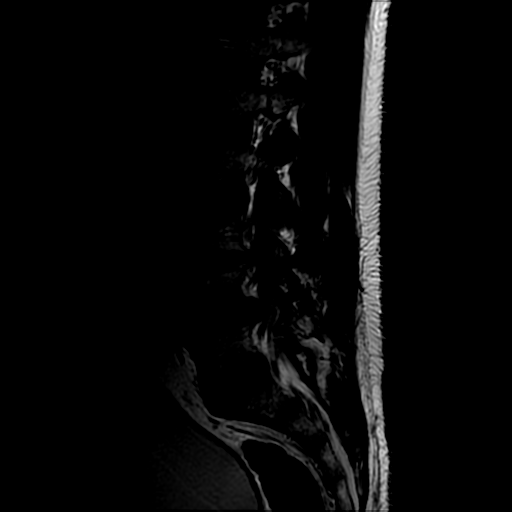
[im 14/14]
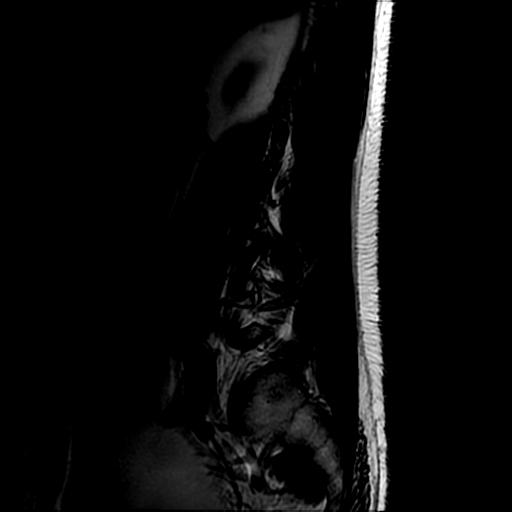

[Series 5: T1 · sagittal · 4.0mm · 0.59mm/px · 3 of 14 slices shown (1 of 2)]
[im 1/14]
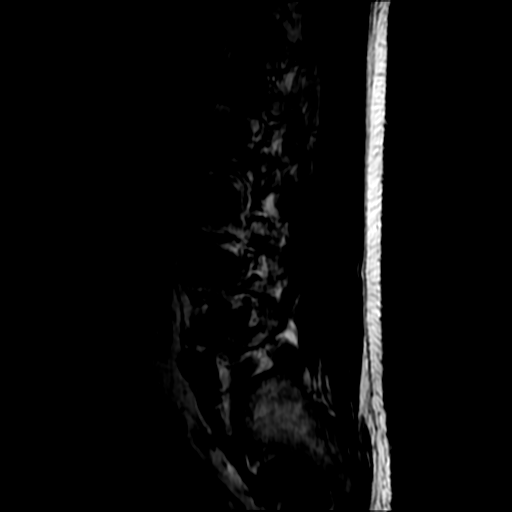
[im 9/14]
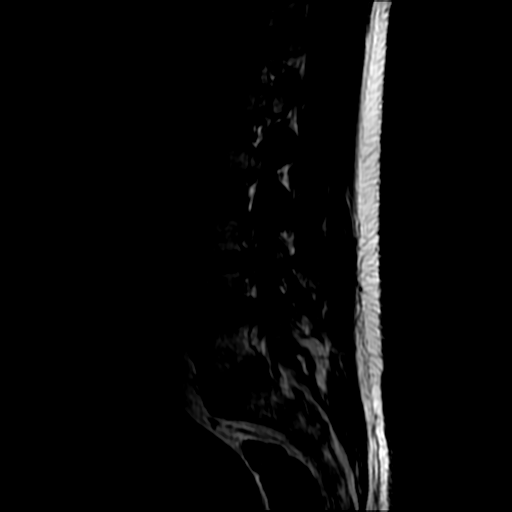
[im 14/14]
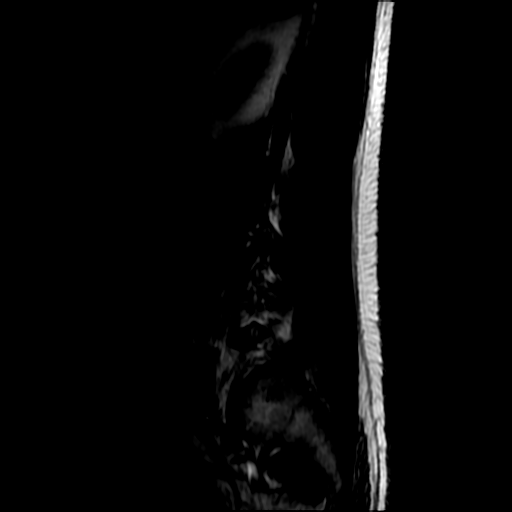

[Series 6: T2 · axial · 4.0mm · 0.39mm/px · z∈[-39,+125]mm · 8 of 38 slices shown (2 of 2)]
[im 1/38]
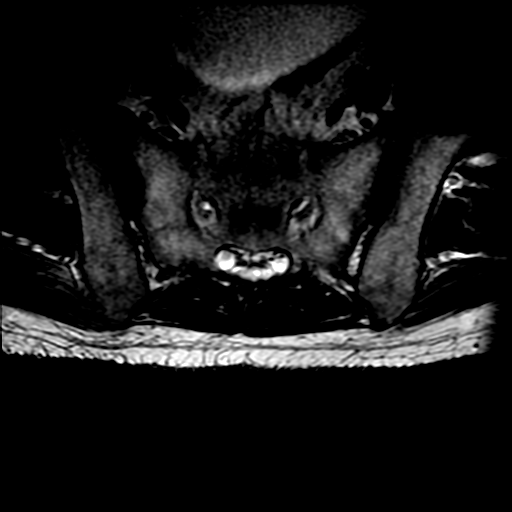
[im 4/38]
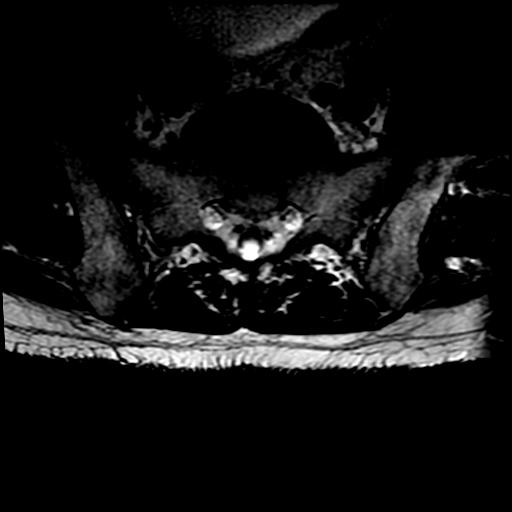
[im 8/38]
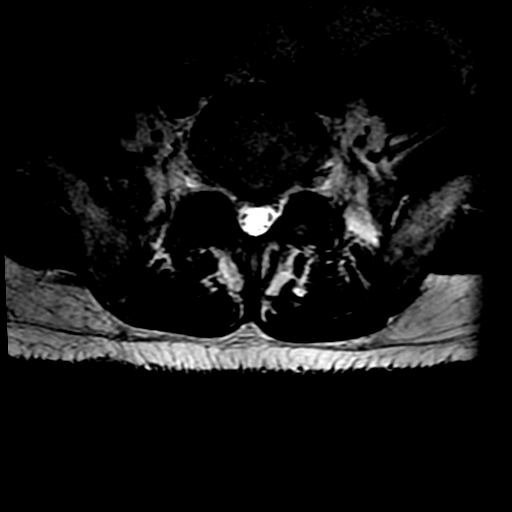
[im 12/38]
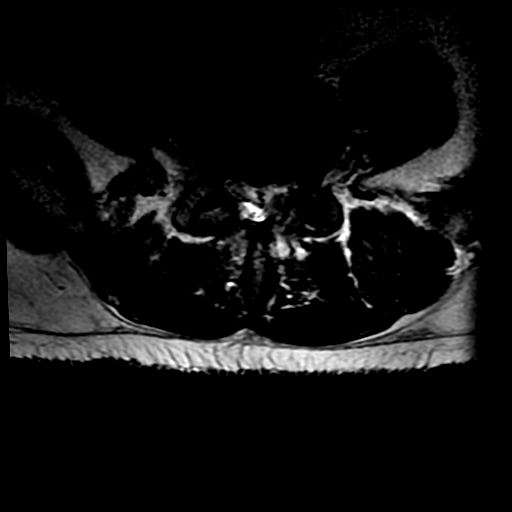
[im 15/38]
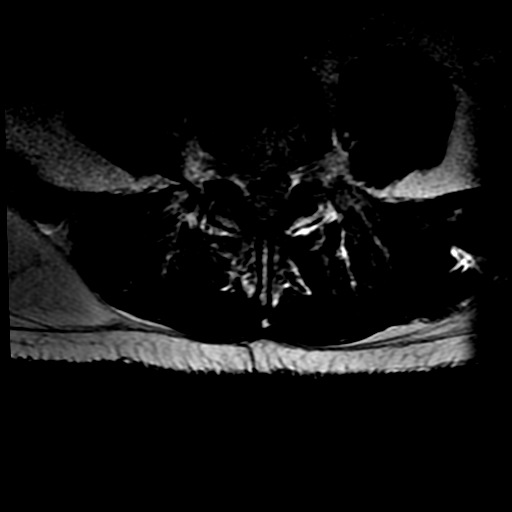
[im 19/38]
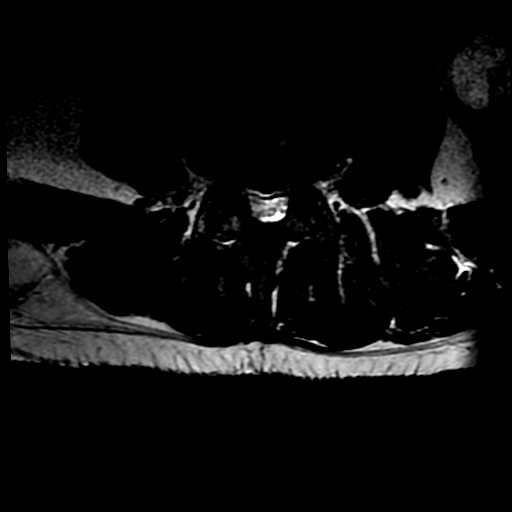
[im 23/38]
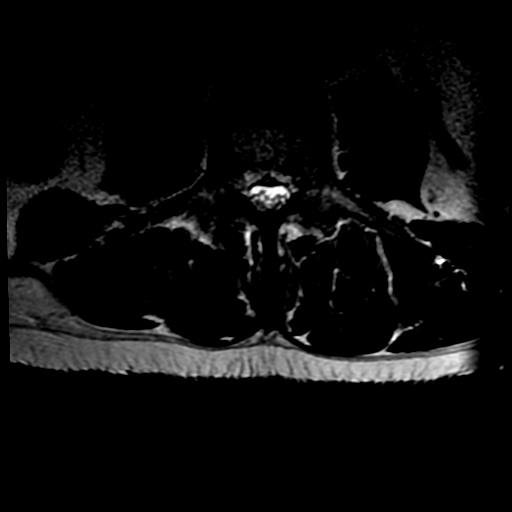
[im 34/38]
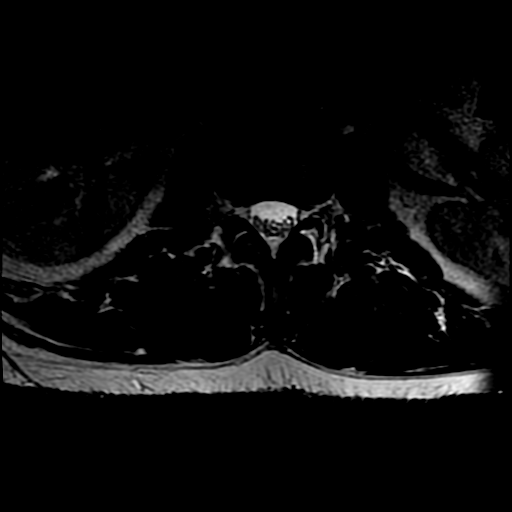

[Series 8: T1 · axial · 4.0mm · 0.39mm/px · z∈[-24,+125]mm · 3 of 38 slices shown (2 of 2)]
[im 4/38]
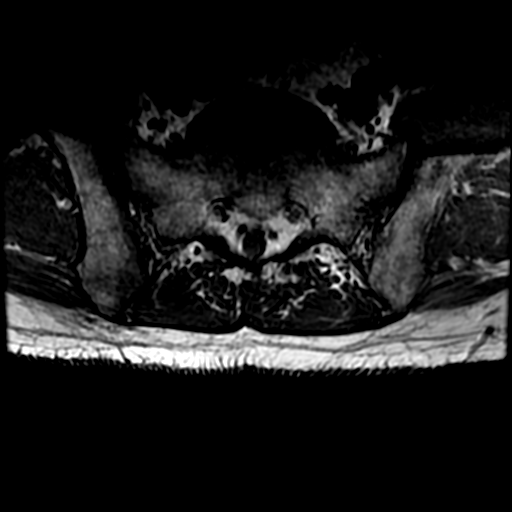
[im 19/38]
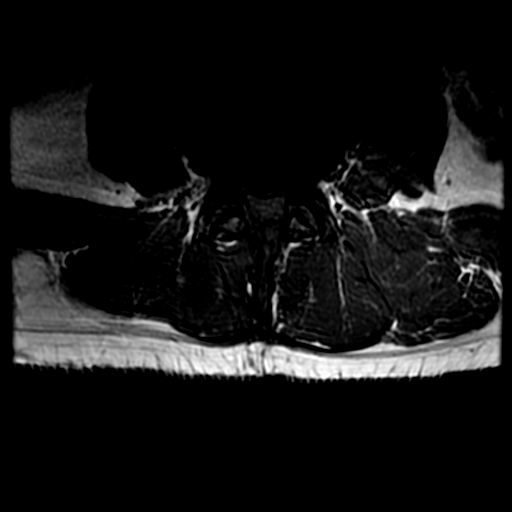
[im 34/38]
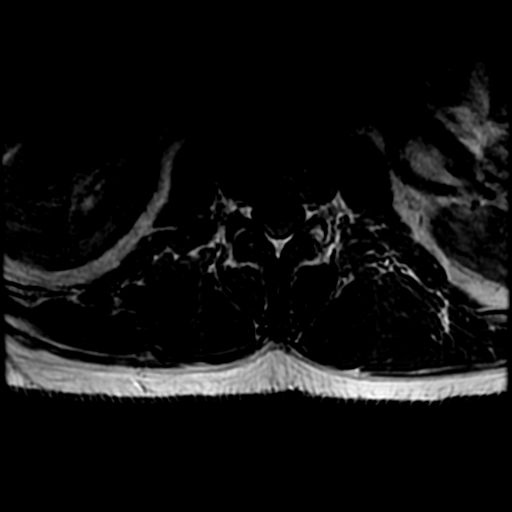

[18 of 48 positions shown; findings below may reference images not displayed]

FINDINGS: Segmentation: 5 non rib-bearing lumbar type vertebral bodies are
present.

Alignment: Slight retrolisthesis is present at L3-4. AP alignment is
otherwise anatomic.

Vertebrae: Progressive chronic endplate marrow changes are present
at L3-4 in left greater than right at L4-5. Vertebral body heights
are preserved. There is some fatty infiltration of the marrow.

Conus medullaris: Extends to the L1 level and appears normal.

Paraspinal and other soft tissues: Limited imaging of the abdomen is
unremarkable. No significant adenopathy is present.

Disc levels:

L1-2:  Negative.

L2-3: Mild disc bulging and facet hypertrophy are present. There is
no significant stenosis.

L3-4: A broad-based disc protrusion is present. Bilateral
laminectomies are noted. No residual central canal stenosis is
present. Moderate foraminal narrowing bilaterally is stable.

L4-5: A very large disc extrusion or more likely free fragment
occupies the spinal canal at the disc level and posterior to the L4
vertebral body. The disc material measures 2.8 x 1.4 x 1.7 cm.
Despite the laminectomies, there is severe central canal stenosis
with obliteration of the thecal sac. Moderate foraminal stenosis is
present bilaterally.

L5-S1: Moderate facet hypertrophy has progressed bilaterally. There
is no focal disc protrusion or stenosis.
IMPRESSION: 1. Large disc extrusion or likely free fragment occupying the spinal
canal posterior to the L4 vertebral body with severe central canal
stenosis. The fragment measures 2.8 x 1.4 x 1.7 cm.
2. Interval laminectomy bilaterally at L3-4 with decompression of
the central canal.
3. Residual moderate foraminal stenosis bilaterally at L3-4 and at
L4-5.
4. Progressive facet hypertrophy at L5-S1 without significant
stenosis.

## 2018-03-08 ENCOUNTER — Encounter: Payer: BLUE CROSS/BLUE SHIELD | Admitting: Internal Medicine

## 2018-03-29 ENCOUNTER — Encounter: Payer: Self-pay | Admitting: Internal Medicine

## 2018-03-29 ENCOUNTER — Ambulatory Visit
Admission: RE | Admit: 2018-03-29 | Discharge: 2018-03-29 | Disposition: A | Discharge status: Home / Self Care | Payer: BLUE CROSS/BLUE SHIELD | Attending: Internal Medicine | Admitting: Internal Medicine

## 2018-03-29 ENCOUNTER — Telehealth: Payer: Self-pay

## 2018-03-29 ENCOUNTER — Ambulatory Visit
Admission: RE | Admit: 2018-03-29 | Discharge: 2018-03-29 | Disposition: A | Discharge status: Home / Self Care | Payer: BLUE CROSS/BLUE SHIELD | Source: Ambulatory Visit | Attending: Internal Medicine | Admitting: Internal Medicine

## 2018-03-29 ENCOUNTER — Ambulatory Visit: Payer: BLUE CROSS/BLUE SHIELD | Admitting: Internal Medicine

## 2018-03-29 VITALS — BP 128/89 | HR 86 | Resp 16 | Ht 73.0 in | Wt 275.0 lb

## 2018-03-29 DIAGNOSIS — M25552 Pain in left hip: Secondary | ICD-10-CM | POA: Diagnosis not present

## 2018-03-29 DIAGNOSIS — B029 Zoster without complications: Secondary | ICD-10-CM

## 2018-03-29 MED ORDER — VALACYCLOVIR HCL 1 G PO TABS: 1000.0000 mg | ORAL_TABLET | Freq: Three times a day (TID) | ORAL | 0 refills | Status: DC

## 2018-03-29 NOTE — Progress Notes (Signed)
dg   Date:  03/29/2018   Name:  Andrew [name]   DOB:  Mar 19, 1971   MRN:  [id number]   Chief Complaint: Herpes Zoster (Left side trunk painful sores and left foot nerve pain. New year Eve he noticed sores and says shirt even hurts it. ) and Hip Pain (Left hip and thigh pain feels like its due to his spinal injury and [id number] issues over past 2 years )  Rash  This is a new problem. The current episode started in the past 7 days. The problem is unchanged. The affected locations include the torso. The rash is characterized by blistering, itchiness, pain and redness. Pertinent negatives include no fatigue, fever or shortness of breath.  Hip Pain   There was no injury mechanism. The pain is present in the left hip. The quality of the pain is described as aching and cramping. The pain is mild. The pain has been intermittent (he believes it is due to his abnormal gait since his surgery for cauda equina syndrome.) since onset.    Review of Systems  Constitutional: Positive for chills. Negative for diaphoresis, fatigue and fever.  Respiratory: Negative for chest tightness, shortness of breath and wheezing.   Cardiovascular: Negative for chest pain, palpitations and leg swelling.  Genitourinary: Negative for difficulty urinating, scrotal swelling and testicular pain.  Musculoskeletal: Positive for arthralgias and gait problem.  Skin: Positive for color change and rash.    Patient Active Problem List   Diagnosis Date Noted  . Hyperlipidemia, mild 11/22/2017  . Current moderate episode of major depressive disorder without prior episode (HCC) 11/21/2017  . Decreased sensation   . Lumbar disc herniation   . Tingling in extremities   . Neurologic gait disorder   . Neuropathic pain   . Neurogenic bowel   . Neurogenic bladder   . Benign essential HTN   . Cauda equina syndrome with neurogenic bladder (HCC) 06/07/2016  . Prediabetes 08/19/2015  . Vitamin D deficiency 07/14/2015  . B12 deficiency  07/14/2015  . Obesity (BMI 30-39.9) 10/05/2014  . Lumbar disc disease with radiculopathy 10/05/2014    Allergies  Allergen Reactions  . Hydrochlorothiazide Other (See Comments)    Severe cramping    Past Surgical History:  Procedure Laterality Date  . LUMBAR LAMINECTOMY/DECOMPRESSION MICRODISCECTOMY Bilateral 01/21/2015   Procedure: LUMBAR THREE-FOUR, LUMBAR FOUR-FIVE LUMBAR LAMINECTOMY/DECOMPRESSION MICRODISCECTOMY ;  Surgeon: Tressie Stalker, MD;  Location: [facility];  Service: Neurosurgery;  Laterality: Bilateral;  L34 L45 laminectomies and foraminotomies  . LUMBAR LAMINECTOMY/DECOMPRESSION MICRODISCECTOMY N/A 06/07/2016   Procedure: LUMBAR LAMINECTOMY/DECOMPRESSION MICRODISCECTOMY, LUMBAR FOUR - FIVE;  Surgeon: Tressie Stalker, MD;  Location: [facility];  Service: Neurosurgery;  Laterality: N/A;  . none  [id number]  . TONSILLECTOMY     as a child   . WISDOM TOOTH EXTRACTION     /w bone graft - also readying for implants     Social History   Tobacco Use  . Smoking status: Former Smoker    Last attempt to quit: 04/28/2002    Years since quitting: 15.9  . Smokeless tobacco: Never Used  Substance Use Topics  . Alcohol use: Yes    Alcohol/week: 0.0 standard drinks    Comment: beer q day   . Drug use: No     Medication list has been reviewed and updated.  Current Meds  Medication Sig  . cholecalciferol (VITAMIN D) 1000 units tablet Take 2,000 Units by mouth daily.  Marland Kitchen gabapentin (NEURONTIN) 300 MG capsule Take 300 mg by  mouth 2 (two) times daily.  Marland Kitchen lisinopril (PRINIVIL,ZESTRIL) 10 MG tablet TAKE 1 TABLET BY MOUTH EVERY DAY  . Multiple Vitamins-Minerals (MULTIVITAMIN ADULT PO) Take 1 tablet by mouth daily.   . valACYclovir (VALTREX) 500 MG tablet Take 2 capsules by mouth daily.    PHQ 2/9 Scores 12/26/2017 11/21/2017 10/18/2017 03/02/2017  PHQ - 2 Score 0 0 2 0  PHQ- 9 Score - 0 12 -    Physical Exam Vitals signs and nursing note reviewed.  Constitutional:      General: He  is not in acute distress.    Appearance: He is well-developed.  HENT:     Head: Normocephalic and atraumatic.  Cardiovascular:     Rate and Rhythm: Normal rate and regular rhythm.  Pulmonary:     Effort: Pulmonary effort is normal. No respiratory distress.     Breath sounds: Normal breath sounds.  Musculoskeletal: Normal range of motion.  Lymphadenopathy:     Cervical: No cervical adenopathy.  Skin:    General: Skin is warm and dry.     Findings: No rash.       Neurological:     Mental Status: He is alert and oriented to person, place, and time.     Gait: Gait abnormal.     Comments: Gait abnormality due to left calf weakness  Psychiatric:        Behavior: Behavior normal.        Thought Content: Thought content normal.     BP 128/89   Pulse 86   Resp 16   Ht 6\' 1"  (1.854 m)   Wt 275 lb (124.7 kg)   SpO2 97%   BMI 36.28 kg/m   Assessment and Plan: 1. Herpes zoster without complication Advil as needed for pain - valACYclovir (VALTREX) 1000 MG tablet; Take 1 tablet (1,000 mg total) by mouth 3 (three) times daily.  Dispense: 30 tablet; Refill: 0  2. Pain of left hip joint Likely soft tissue related to gait mechanics - xray to rule out joint abnormality Continue gabapentin - DG HIP UNILAT WITH PELVIS 2-3 VIEWS LEFT; Future   Partially dictated using Animal nutritionist. Any errors are unintentional.  Bari Edward, MD [facility]  03/29/2018

## 2018-05-21 NOTE — Telephone Encounter (Signed)
ERROR

## 2018-06-07 ENCOUNTER — Other Ambulatory Visit: Payer: Self-pay | Admitting: Internal Medicine

## 2018-06-07 ENCOUNTER — Encounter: Payer: Self-pay | Admitting: Internal Medicine

## 2018-06-07 DIAGNOSIS — I10 Essential (primary) hypertension: Secondary | ICD-10-CM

## 2018-06-09 ENCOUNTER — Other Ambulatory Visit: Payer: Self-pay | Admitting: Internal Medicine

## 2018-06-09 MED ORDER — GABAPENTIN 300 MG PO CAPS: 300.0000 mg | ORAL_CAPSULE | Freq: Two times a day (BID) | ORAL | 3 refills | Status: DC

## 2018-08-06 DIAGNOSIS — Z03818 Encounter for observation for suspected exposure to other biological agents ruled out: Secondary | ICD-10-CM | POA: Diagnosis not present

## 2018-09-05 DIAGNOSIS — Z20828 Contact with and (suspected) exposure to other viral communicable diseases: Secondary | ICD-10-CM | POA: Diagnosis not present

## 2018-09-17 ENCOUNTER — Encounter: Payer: Self-pay | Admitting: Internal Medicine

## 2018-09-17 ENCOUNTER — Other Ambulatory Visit: Payer: Self-pay

## 2018-09-17 ENCOUNTER — Ambulatory Visit (INDEPENDENT_AMBULATORY_CARE_PROVIDER_SITE_OTHER): Payer: BC Managed Care – PPO | Admitting: Internal Medicine

## 2018-09-17 VITALS — BP 118/80 | HR 68 | Ht 73.0 in | Wt 260.6 lb

## 2018-09-17 DIAGNOSIS — R7303 Prediabetes: Secondary | ICD-10-CM

## 2018-09-17 DIAGNOSIS — I10 Essential (primary) hypertension: Secondary | ICD-10-CM | POA: Diagnosis not present

## 2018-09-17 DIAGNOSIS — G834 Cauda equina syndrome: Secondary | ICD-10-CM | POA: Diagnosis not present

## 2018-09-17 DIAGNOSIS — E559 Vitamin D deficiency, unspecified: Secondary | ICD-10-CM

## 2018-09-17 DIAGNOSIS — E785 Hyperlipidemia, unspecified: Secondary | ICD-10-CM | POA: Diagnosis not present

## 2018-09-17 DIAGNOSIS — E538 Deficiency of other specified B group vitamins: Secondary | ICD-10-CM

## 2018-09-17 DIAGNOSIS — Z0000 Encounter for general adult medical examination without abnormal findings: Secondary | ICD-10-CM | POA: Diagnosis not present

## 2018-09-17 LAB — POCT URINALYSIS DIPSTICK
Bilirubin, UA: NEGATIVE
Blood, UA: NEGATIVE
Glucose, UA: NEGATIVE
Ketones, UA: NEGATIVE
Leukocytes, UA: NEGATIVE
Nitrite, UA: NEGATIVE
Protein, UA: NEGATIVE
Spec Grav, UA: 1.015 (ref 1.010–1.025)
Urobilinogen, UA: 0.2 E.U./dL
pH, UA: 7 (ref 5.0–8.0)

## 2018-09-17 NOTE — Progress Notes (Signed)
Date:  09/17/2018   Name:  Andrew [name] Hausner   DOB:  09/05/1970   MRN:  [id number]   Chief Complaint: Annual Exam Andrew [name] Scheu is a 48 y.o. male who presents today for his Complete Annual Exam. He feels fairly well. He reports exercising some but less since gyms are closed. He reports he is sleeping well.   Hypertension This is a chronic problem. The problem is controlled. Pertinent negatives include no chest pain, headaches, palpitations or shortness of breath. Past treatments include ACE inhibitors. The current treatment provides significant improvement.  Diabetes He presents for his follow-up diabetic visit. Diabetes type: pre-diabetes. Hypoglycemia symptoms include nervousness/anxiousness (due to job responsibility with Covid). Pertinent negatives for hypoglycemia include no dizziness or headaches. Associated symptoms include weakness (leg weakness on left). Pertinent negatives for diabetes include no chest pain and no fatigue.  Cauda Equina syndrome - s/p lumbar laminectomy with some residual left leg weakness and mild ED.  His bowel and bladder function has essentially returned to normal.  He continues to improve gradually and is happy with his progress.  He takes gabapentin for mild leg neuropathic sx.  Review of Systems  Constitutional: Negative for appetite change, chills, diaphoresis, fatigue and unexpected weight change.  HENT: Negative for hearing loss, tinnitus, trouble swallowing and voice change.   Eyes: Negative for visual disturbance.  Respiratory: Negative for choking, shortness of breath and wheezing.   Cardiovascular: Negative for chest pain, palpitations and leg swelling.  Gastrointestinal: Negative for abdominal pain, blood in stool, constipation and diarrhea.  Genitourinary: Negative for difficulty urinating, dysuria and frequency.  Musculoskeletal: Positive for arthralgias (right elbow tendonitis). Negative for back pain and myalgias.  Skin: Negative for color change and  rash.  Neurological: Positive for weakness (leg weakness on left) and numbness. Negative for dizziness, syncope and headaches.  Hematological: Negative for adenopathy.  Psychiatric/Behavioral: Negative for dysphoric mood and sleep disturbance. The patient is nervous/anxious (due to job responsibility with Covid).     Patient Active Problem List   Diagnosis Date Noted  . Pain of left hip joint 03/29/2018  . Hyperlipidemia, mild 11/22/2017  . Current moderate episode of major depressive disorder without prior episode (HCC) 11/21/2017  . Lumbar disc herniation   . Neurologic gait disorder   . Neuropathic pain   . Neurogenic bowel   . Neurogenic bladder   . Benign essential HTN   . Cauda equina syndrome with neurogenic bladder (HCC) 06/07/2016  . Prediabetes 08/19/2015  . Vitamin D deficiency 07/14/2015  . B12 deficiency 07/14/2015  . Obesity (BMI 30-39.9) 10/05/2014  . Lumbar disc disease with radiculopathy 10/05/2014    Allergies  Allergen Reactions  . Hydrochlorothiazide Other (See Comments)    Severe cramping    Past Surgical History:  Procedure Laterality Date  . LUMBAR LAMINECTOMY/DECOMPRESSION MICRODISCECTOMY Bilateral 01/21/2015   Procedure: LUMBAR THREE-FOUR, LUMBAR FOUR-FIVE LUMBAR LAMINECTOMY/DECOMPRESSION MICRODISCECTOMY ;  Surgeon: Tressie StalkerJeffrey Jenkins, MD;  Location: [facility];  Service: Neurosurgery;  Laterality: Bilateral;  L34 L45 laminectomies and foraminotomies  . LUMBAR LAMINECTOMY/DECOMPRESSION MICRODISCECTOMY N/A 06/07/2016   Procedure: LUMBAR LAMINECTOMY/DECOMPRESSION MICRODISCECTOMY, LUMBAR FOUR - FIVE;  Surgeon: Tressie StalkerJeffrey Jenkins, MD;  Location: [facility];  Service: Neurosurgery;  Laterality: N/A;  . none  [id number]  . TONSILLECTOMY     as a child   . WISDOM TOOTH EXTRACTION     /w bone graft - also readying for implants     Social History   Tobacco Use  . Smoking status: Former Smoker  Quit date: 04/28/2002    Years since quitting: 16.4  . Smokeless  tobacco: Never Used  Substance Use Topics  . Alcohol use: Yes    Alcohol/week: 0.0 standard drinks    Comment: beer q day   . Drug use: No     Medication list has been reviewed and updated.  Current Meds  Medication Sig  . cholecalciferol (VITAMIN D) 1000 units tablet Take 2,000 Units by mouth daily.  Marland Kitchen. gabapentin (NEURONTIN) 300 MG capsule Take 1 capsule (300 mg total) by mouth 2 (two) times daily.  Marland Kitchen. lisinopril (PRINIVIL,ZESTRIL) 10 MG tablet TAKE 1 TABLET BY MOUTH EVERY DAY  . Multiple Vitamins-Minerals (MULTIVITAMIN ADULT PO) Take 1 tablet by mouth daily.   . valACYclovir (VALTREX) 1000 MG tablet Take 1 tablet (1,000 mg total) by mouth 3 (three) times daily. (Patient taking differently: Take 1,000 mg by mouth 3 (three) times daily. PRN)    PHQ 2/9 Scores 09/17/2018 12/26/2017 11/21/2017 10/18/2017  PHQ - 2 Score 0 0 0 2  PHQ- 9 Score 0 - 0 12    BP Readings from Last 3 Encounters:  09/17/18 118/80  03/29/18 128/89  12/26/17 (!) 143/93    Physical Exam Vitals signs and nursing note reviewed.  Constitutional:      Appearance: Normal appearance. He is well-developed.  HENT:     Head: Normocephalic.     Right Ear: Tympanic membrane, ear canal and external ear normal.     Left Ear: Tympanic membrane, ear canal and external ear normal.     Nose: Nose normal.     Mouth/Throat:     Pharynx: Uvula midline.  Eyes:     Conjunctiva/sclera: Conjunctivae normal.     Pupils: Pupils are equal, round, and reactive to light.  Neck:     Musculoskeletal: Normal range of motion and neck supple.     Thyroid: No thyromegaly.     Vascular: No carotid bruit.  Cardiovascular:     Rate and Rhythm: Normal rate and regular rhythm.     Heart sounds: Normal heart sounds.  Pulmonary:     Effort: Pulmonary effort is normal.     Breath sounds: Normal breath sounds. No wheezing.  Chest:     Breasts:        Right: No mass.        Left: No mass.  Abdominal:     General: Bowel sounds are  normal.     Palpations: Abdomen is soft.     Tenderness: There is no abdominal tenderness.  Musculoskeletal: Normal range of motion.     Right lower leg: No edema.     Left lower leg: No edema.  Lymphadenopathy:     Cervical: No cervical adenopathy.  Skin:    General: Skin is warm and dry.  Neurological:     Mental Status: He is alert and oriented to person, place, and time.     Deep Tendon Reflexes:     Reflex Scores:      Patellar reflexes are 1+ on the right side and 1+ on the left side.    Comments: Left foot plantar flexion 4/5 Right and left foot flexion/extension otherwise 5/5  Decreased sensation plantar aspect left foot and toes  Psychiatric:        Speech: Speech normal.        Behavior: Behavior normal.        Thought Content: Thought content normal.        Judgment: Judgment normal.  Wt Readings from Last 3 Encounters:  09/17/18 260 lb 9.6 oz (118.2 kg)  03/29/18 275 lb (124.7 kg)  12/26/17 275 lb 9.6 oz (125 kg)    BP 118/80   Pulse 68   Ht 6\' 1"  (1.854 m)   Wt 260 lb 9.6 oz (118.2 kg)   SpO2 98%   BMI 34.38 kg/m   Assessment and Plan: 1. Annual physical exam Normal exam except for weight Continue work on diet, exercise - POCT urinalysis dipstick  2. Benign essential HTN controlled - CBC with Differential/Platelet  3. Prediabetes Check labs - Comprehensive metabolic panel - Hemoglobin A1c  4. Cauda equina syndrome with neurogenic bladder (HCC) Improving slowly since surgery Bladder and bowel returned to normal Continue gabapentin  5. Vitamin D deficiency Check levels to advise - VITAMIN D 25 Hydroxy (Vit-D Deficiency, Fractures)  6. Hyperlipidemia, mild - Lipid panel  7. B12 deficiency - Vitamin B12   Partially dictated using Editor, commissioning. Any errors are unintentional.  Halina Maidens, MD [facility]  09/17/2018

## 2018-09-18 LAB — LIPID PANEL
Chol/HDL Ratio: 4.3 ratio (ref 0.0–5.0)
Cholesterol, Total: 180 mg/dL (ref 100–199)
HDL: 42 mg/dL (ref >39)
LDL Calculated: 104 mg/dL — ABNORMAL HIGH (ref 0–99)
Triglycerides: 171 mg/dL — ABNORMAL HIGH (ref 0–149)
VLDL Cholesterol Cal: 34 mg/dL (ref 5–40)

## 2018-09-18 LAB — COMPREHENSIVE METABOLIC PANEL
ALT: 28 IU/L (ref 0–44)
AST: 23 IU/L (ref 0–40)
Albumin/Globulin Ratio: 2.1 (ref 1.2–2.2)
Albumin: 4.8 g/dL (ref 4.0–5.0)
Alkaline Phosphatase: 52 IU/L (ref 39–117)
BUN/Creatinine Ratio: 11 (ref 9–20)
BUN: 11 mg/dL (ref 6–24)
Bilirubin Total: 0.6 mg/dL (ref 0.0–1.2)
CO2: 26 mmol/L (ref 20–29)
Calcium: 10 mg/dL (ref 8.7–10.2)
Chloride: 102 mmol/L (ref 96–106)
Creatinine, Ser: 0.98 mg/dL (ref 0.76–1.27)
GFR calc Af Amer: 106 mL/min/{1.73_m2} (ref >59)
GFR calc non Af Amer: 92 mL/min/{1.73_m2} (ref >59)
Globulin, Total: 2.3 g/dL (ref 1.5–4.5)
Glucose: 96 mg/dL (ref 65–99)
Potassium: 4.8 mmol/L (ref 3.5–5.2)
Sodium: 141 mmol/L (ref 134–144)
Total Protein: 7.1 g/dL (ref 6.0–8.5)

## 2018-09-18 LAB — CBC WITH DIFFERENTIAL/PLATELET
Basophils Absolute: 0 10*3/uL (ref 0.0–0.2)
Basos: 0 %
EOS (ABSOLUTE): 0.2 10*3/uL (ref 0.0–0.4)
Eos: 3 %
Hematocrit: 44.7 % (ref 37.5–51.0)
Hemoglobin: 14.9 g/dL (ref 13.0–17.7)
Immature Grans (Abs): 0 10*3/uL (ref 0.0–0.1)
Immature Granulocytes: 0 %
Lymphocytes Absolute: 1.9 10*3/uL (ref 0.7–3.1)
Lymphs: 31 %
MCH: 30.3 pg (ref 26.6–33.0)
MCHC: 33.3 g/dL (ref 31.5–35.7)
MCV: 91 fL (ref 79–97)
Monocytes Absolute: 0.6 10*3/uL (ref 0.1–0.9)
Monocytes: 9 %
Neutrophils Absolute: 3.5 10*3/uL (ref 1.4–7.0)
Neutrophils: 57 %
Platelets: 248 10*3/uL (ref 150–450)
RBC: 4.91 x10E6/uL (ref 4.14–5.80)
RDW: 13.2 % (ref 11.6–15.4)
WBC: 6.2 10*3/uL (ref 3.4–10.8)

## 2018-09-18 LAB — VITAMIN B12: Vitamin B-12: 273 pg/mL (ref 232–1245)

## 2018-09-18 LAB — HEMOGLOBIN A1C
Est. average glucose Bld gHb Est-mCnc: 114 mg/dL
Hgb A1c MFr Bld: 5.6 % (ref 4.8–5.6)

## 2018-09-18 LAB — VITAMIN D 25 HYDROXY (VIT D DEFICIENCY, FRACTURES): Vit D, 25-Hydroxy: 28.8 ng/mL — ABNORMAL LOW (ref 30.0–100.0)

## 2018-12-18 ENCOUNTER — Other Ambulatory Visit: Payer: Self-pay

## 2018-12-18 ENCOUNTER — Ambulatory Visit: Payer: Self-pay

## 2018-12-18 VITALS — BP 146/78 | HR 65 | Resp 16

## 2018-12-18 DIAGNOSIS — Z23 Encounter for immunization: Secondary | ICD-10-CM

## 2018-12-18 DIAGNOSIS — Z008 Encounter for other general examination: Secondary | ICD-10-CM

## 2018-12-18 LAB — POCT LIPID PANEL
HDL: 47
LDL: 126
Non-HDL: 147
POC Glucose: 96 mg/dl (ref 70–99)
TC/HDL: 4.2
TC: 194
TRG: 108

## 2018-12-18 NOTE — Patient Instructions (Signed)
Influenza (Flu) Vaccine (Inactivated or Recombinant): What You Need to Know 1. Why get vaccinated? Influenza vaccine can prevent influenza (flu). Flu is a contagious disease that spreads around the [place] every year, usually between October and May. Anyone can get the flu, but it is more dangerous for some people. Infants and young children, people 46 years of age and older, pregnant women, and people with certain health conditions or a weakened immune system are at greatest risk of flu complications. Pneumonia, bronchitis, sinus infections and ear infections are examples of flu-related complications. If you have a medical condition, such as heart disease, cancer or diabetes, flu can make it worse. Flu can cause fever and chills, sore throat, muscle aches, fatigue, cough, headache, and runny or stuffy nose. Some people may have vomiting and diarrhea, though this is more common in children than adults. Each year thousands of people in the [place] die from flu, and many more are hospitalized. Flu vaccine prevents millions of illnesses and flu-related visits to the doctor each year. 2. Influenza vaccine CDC recommends everyone 6 months of age and older get vaccinated every flu season. Children 6 months through 8 years of age may need 2 doses during a single flu season. Everyone else needs only 1 dose each flu season. It takes about 2 weeks for protection to develop after vaccination. There are many flu viruses, and they are always changing. Each year a new flu vaccine is made to protect against three or four viruses that are likely to cause disease in the upcoming flu season. Even when the vaccine doesn't exactly match these viruses, it may still provide some protection. Influenza vaccine does not cause flu. Influenza vaccine may be given at the same time as other vaccines. 3. Talk with your health care provider Tell your vaccine provider if the person getting the vaccine:  Has had an  allergic reaction after a previous dose of influenza vaccine, or has any severe, life-threatening allergies.  Has ever had Guillain-Barr Syndrome (also called GBS). In some cases, your health care provider may decide to postpone influenza vaccination to a future visit. People with minor illnesses, such as a cold, may be vaccinated. People who are moderately or severely ill should usually wait until they recover before getting influenza vaccine. Your health care provider can give you more information. 4. Risks of a vaccine reaction  Soreness, redness, and swelling where shot is given, fever, muscle aches, and headache can happen after influenza vaccine.  There may be a very small increased risk of Guillain-Barr Syndrome (GBS) after inactivated influenza vaccine (the flu shot). Young children who get the flu shot along with pneumococcal vaccine (PCV13), and/or DTaP vaccine at the same time might be slightly more likely to have a seizure caused by fever. Tell your health care provider if a child who is getting flu vaccine has ever had a seizure. People sometimes faint after medical procedures, including vaccination. Tell your provider if you feel dizzy or have vision changes or ringing in the ears. As with any medicine, there is a very remote chance of a vaccine causing a severe allergic reaction, other serious injury, or death. 5. What if there is a serious problem? An allergic reaction could occur after the vaccinated person leaves the clinic. If you see signs of a severe allergic reaction (hives, swelling of the face and throat, difficulty breathing, a fast heartbeat, dizziness, or weakness), call 9-1-1 and get the person to the nearest hospital. For other signs that   concern you, call your health care provider. Adverse reactions should be reported to the Vaccine Adverse Event Reporting System (VAERS). Your health care provider will usually file this report, or you can do it yourself. Visit the  VAERS website at www.vaers.hhs.gov or call 1-800-822-7967.VAERS is only for reporting reactions, and VAERS staff do not give medical advice. 6. The National Vaccine Injury Compensation Program The National Vaccine Injury Compensation Program (VICP) is a federal program that was created to compensate people who may have been injured by certain vaccines. Visit the VICP website at www.hrsa.gov/vaccinecompensation or call 1-800-338-2382 to learn about the program and about filing a claim. There is a time limit to file a claim for compensation. 7. How can I learn more?  Ask your healthcare provider.  Call your local or state health department.  Contact the Centers for Disease Control and Prevention (CDC): ? Call 1-800-232-4636 (1-800-CDC-INFO) or ? Visit CDC's www.cdc.gov/flu Vaccine Information Statement (Interim) Inactivated Influenza Vaccine (11/08/2017) This information is not intended to replace advice given to you by your health care provider. Make sure you discuss any questions you have with your health care provider. Document Released: 01/05/2006 Document Revised: 07/02/2018 Document Reviewed: 11/12/2017 Elsevier Patient Education  2020 Elsevier Inc. Preventing Influenza, Adult Influenza, more commonly known as "the flu," is a viral infection that mainly affects the respiratory tract. The respiratory tract includes structures that help you breathe, such as the lungs, nose, and throat. The flu causes many common cold symptoms, as well as a high fever and body aches. The flu spreads easily from person to person (is contagious). The flu is most common from December through March. This is called flu season.You can catch the flu virus by:  Breathing in droplets from an infected person's cough or sneeze.  Touching something that was recently contaminated with the virus and then touching your mouth, nose, or eyes. What can I do to lower my risk?        You can decrease your risk of getting  the flu by:  Getting a flu shot (influenza vaccination) every year. This is the best way to prevent the flu. A flu shot is recommended for everyone age 6 months and older. ? It is best to get a flu shot in the fall, as soon as it is available. Getting a flu shot during winter or spring instead is still a good idea. Flu season can last into early spring. ? Preventing the flu through vaccination requires getting a new flu shot every year. This is because the flu virus changes slightly (mutates) from one year to the next. Even if a flu shot does not completely protect you from all flu virus mutations, it can reduce the severity of your illness and prevent dangerous complications of the flu. ? If you are pregnant, you can and should get a flu shot. ? If you have had a reaction to the shot in the past or if you are allergic to eggs, check with your health care provider before getting a flu shot. ? Sometimes the vaccine is available as a nasal spray. In some years, the nasal spray has not been as effective against the flu virus. Check with your health care provider if you have questions about this.  Practicing good health habits. This is especially important during flu season. ? Avoid contact with people who are sick with flu or cold symptoms. ? Wash your hands with soap and water often. If soap and water are not available, use alcohol-based   hand sanitizer. ? Avoid touching your hands to your face, especially when you have not washed your hands recently. ? Use a disinfectant to clean surfaces at home and at work that may be contaminated with the flu virus. ? Keep your body's disease-fighting system (immune system) in good shape by eating a healthy diet, drinking plenty of fluids, getting enough sleep, and exercising regularly. If you do get the flu, avoid spreading it to others by:  Staying home until your symptoms have been gone for at least one day.  Covering your mouth and nose when you cough or  sneeze.  Avoiding close contact with others, especially babies and elderly people. Why are these changes important? Getting a flu shot and practicing good health habits protects you as well as other people. If you get the flu, your friends, family, and co-workers are also at risk of getting it, because it spreads so easily to others. Each year, about 2 out of every 10 people get the flu. Having the flu can lead to complications, such as pneumonia, ear infection, and sinus infection. The flu also can be deadly, especially for babies, people older than age 46, and people who have serious long-term diseases. How is this treated? Most people recover from the flu by resting at home and drinking plenty of fluids. However, a prescription antiviral medicine may reduce your flu symptoms and may make your flu go away sooner. This medicine must be started within a few days of getting flu symptoms. You can talk with your health care provider about whether you need an antiviral medicine. Antiviral medicine may be prescribed for people who are at risk for more serious flu symptoms. This includes people who:  Are older than age 46.  Are pregnant.  Have a condition that makes the flu worse or more dangerous. Where to find more information  Centers for Disease Control and Prevention: www.cdc.gov/flu/index.htm  Flu.gov: www.flu.gov/prevention-vaccination  American Academy of Family Physicians: familydoctor.org/familydoctor/en/kids/vaccines/preventing-the-flu.html Contact a health care provider if:  You have influenza and you develop new symptoms.  You have: ? Chest pain. ? Diarrhea. ? A fever.  Your cough gets worse, or you produce more mucus. Summary  The best way to prevent the flu is to get a flu shot every year in the fall.  Even if you get the flu after you have received the yearly vaccine, your flu may be milder and go away sooner because of your flu shot.  If you get the flu, antiviral  medicines that are started with a few days of symptoms may reduce your flu symptoms and may make your flu go away sooner.  You can also help prevent the flu by practicing good health habits. This information is not intended to replace advice given to you by your health care provider. Make sure you discuss any questions you have with your health care provider. Document Released: 03/28/2015 Document Revised: 02/23/2017 Document Reviewed: 11/20/2015 Elsevier Patient Education  2020 Elsevier Inc.  

## 2018-12-18 NOTE — Progress Notes (Signed)
     Patient ID: Andrew [name], male    DOB: [birth year], 48 y.o.   MRN: [id number]    Thank you!!  [facility] [place]: [phone]  Cell:  [phone] Website: [email or web address]

## 2019-03-06 ENCOUNTER — Ambulatory Visit: Payer: BC Managed Care – PPO | Admitting: Internal Medicine

## 2019-03-06 ENCOUNTER — Encounter: Payer: Self-pay | Admitting: Internal Medicine

## 2019-03-06 ENCOUNTER — Other Ambulatory Visit: Payer: Self-pay

## 2019-03-06 VITALS — BP 130/82 | HR 63 | Ht 73.0 in | Wt 265.0 lb

## 2019-03-06 DIAGNOSIS — R269 Unspecified abnormalities of gait and mobility: Secondary | ICD-10-CM | POA: Diagnosis not present

## 2019-03-06 DIAGNOSIS — I10 Essential (primary) hypertension: Secondary | ICD-10-CM | POA: Diagnosis not present

## 2019-03-06 DIAGNOSIS — E559 Vitamin D deficiency, unspecified: Secondary | ICD-10-CM | POA: Diagnosis not present

## 2019-03-06 NOTE — Progress Notes (Signed)
Date:  03/06/2019   Name:  Andrew [name]   DOB:  1971-01-26   MRN:  [id number]   Chief Complaint: Hypertension (6 month follow up.)  Hypertension This is a chronic problem. The problem is controlled. Pertinent negatives include no chest pain, headaches, palpitations or shortness of breath. Past treatments include ACE inhibitors. The current treatment provides significant improvement. There are no compliance problems.   Vitamin D def - just completed the regimen of high dose weekly.  He has not yet started the daily supplement. Gait disorder due to cauda equina syndrome - he reports doing fairly well.  He is gaining strength in his leg.  He still takes gabapentin.  Bowel and bladder changes are stable and tolerable. Lab Results  Component Value Date   CREATININE 0.98 09/17/2018   BUN 11 09/17/2018   NA 141 09/17/2018   K 4.8 09/17/2018   CL 102 09/17/2018   CO2 26 09/17/2018   Lab Results  Component Value Date   CHOL 180 09/17/2018   HDL 42 09/17/2018   LDLCALC 104 (H) 09/17/2018   TRIG 171 (H) 09/17/2018   CHOLHDL 4.3 09/17/2018   Lab Results  Component Value Date   TSH 1.490 11/21/2017   Lab Results  Component Value Date   HGBA1C 5.6 09/17/2018     Review of Systems  Constitutional: Negative for chills, fatigue and fever.  Respiratory: Negative for chest tightness, shortness of breath and wheezing.   Cardiovascular: Negative for chest pain, palpitations and leg swelling.  Gastrointestinal: Negative for abdominal pain, constipation and diarrhea.  Genitourinary: Positive for frequency (due to mild persistent bladder dysfunction). Negative for dysuria and hematuria.  Musculoskeletal: Positive for gait problem (slowly continues to improve).  Skin: Positive for color change (small flat lesion in tip of penis x 6 mo - unchanged, asx). Negative for rash.  Neurological: Negative for dizziness, light-headedness and headaches.    Patient Active Problem List   Diagnosis Date  Noted  . Pain of left hip joint 03/29/2018  . Hyperlipidemia, mild 11/22/2017  . Lumbar disc herniation   . Neurologic gait disorder   . Neuropathic pain   . Benign essential HTN   . Cauda equina syndrome (HCC) 06/07/2016  . Prediabetes 08/19/2015  . Vitamin D deficiency 07/14/2015  . B12 deficiency 07/14/2015  . Obesity (BMI 30-39.9) 10/05/2014  . Lumbar disc disease with radiculopathy 10/05/2014    Allergies  Allergen Reactions  . Hydrochlorothiazide Other (See Comments)    Severe cramping    Past Surgical History:  Procedure Laterality Date  . LUMBAR LAMINECTOMY/DECOMPRESSION MICRODISCECTOMY Bilateral 01/21/2015   Procedure: LUMBAR THREE-FOUR, LUMBAR FOUR-FIVE LUMBAR LAMINECTOMY/DECOMPRESSION MICRODISCECTOMY ;  Surgeon: Tressie Stalker, MD;  Location: [facility];  Service: Neurosurgery;  Laterality: Bilateral;  L34 L45 laminectomies and foraminotomies  . LUMBAR LAMINECTOMY/DECOMPRESSION MICRODISCECTOMY N/A 06/07/2016   Procedure: LUMBAR LAMINECTOMY/DECOMPRESSION MICRODISCECTOMY, LUMBAR FOUR - FIVE;  Surgeon: Tressie Stalker, MD;  Location: [facility];  Service: Neurosurgery;  Laterality: N/A;  . none  [id number]  . TONSILLECTOMY     as a child   . WISDOM TOOTH EXTRACTION     /w bone graft - also readying for implants     Social History   Tobacco Use  . Smoking status: Former Smoker    Quit date: 04/28/2002    Years since quitting: 16.8  . Smokeless tobacco: Never Used  Substance Use Topics  . Alcohol use: Yes    Alcohol/week: 0.0 standard drinks    Comment: beer  q day   . Drug use: No     Medication list has been reviewed and updated.  Current Meds  Medication Sig  . cholecalciferol (VITAMIN D) 1000 units tablet Take 2,000 Units by mouth daily.  Marland Kitchen. gabapentin (NEURONTIN) 300 MG capsule Take 1 capsule (300 mg total) by mouth 2 (two) times daily.  Marland Kitchen. lisinopril (PRINIVIL,ZESTRIL) 10 MG tablet TAKE 1 TABLET BY MOUTH EVERY DAY  . Multiple Vitamins-Minerals (MULTIVITAMIN  ADULT PO) Take 1 tablet by mouth daily.   . valACYclovir (VALTREX) 1000 MG tablet Take 1 tablet (1,000 mg total) by mouth 3 (three) times daily. (Patient taking differently: Take 1,000 mg by mouth 3 (three) times daily. PRN)    PHQ 2/9 Scores 03/06/2019 09/17/2018 12/26/2017 11/21/2017  PHQ - 2 Score 0 0 0 0  PHQ- 9 Score 0 0 - 0    BP Readings from Last 3 Encounters:  03/06/19 130/82  12/18/18 (!) 146/78  09/17/18 118/80    Physical Exam Vitals and nursing note reviewed.  Constitutional:      General: He is not in acute distress.    Appearance: Normal appearance. He is well-developed.  HENT:     Head: Normocephalic and atraumatic.  Cardiovascular:     Rate and Rhythm: Normal rate and regular rhythm.     Heart sounds: No murmur.  Pulmonary:     Effort: Pulmonary effort is normal. No respiratory distress.  Genitourinary:    Comments: Exam deferred - pt will follow up if there is change Musculoskeletal:        General: Normal range of motion.     Cervical back: Normal range of motion.     Right lower leg: No edema.     Left lower leg: No edema.  Lymphadenopathy:     Cervical: No cervical adenopathy.  Skin:    General: Skin is warm and dry.     Capillary Refill: Capillary refill takes less than 2 seconds.     Findings: No rash.  Neurological:     General: No focal deficit present.     Mental Status: He is alert and oriented to person, place, and time.     Deep Tendon Reflexes:     Reflex Scores:      Patellar reflexes are 1+ on the right side and 2+ on the left side. Psychiatric:        Behavior: Behavior normal.        Thought Content: Thought content normal.     Wt Readings from Last 3 Encounters:  03/06/19 265 lb (120.2 kg)  09/17/18 260 lb 9.6 oz (118.2 kg)  03/29/18 275 lb (124.7 kg)    BP 130/82   Pulse 63   Ht 6\' 1"  (1.854 m)   Wt 265 lb (120.2 kg)   SpO2 98%   BMI 34.96 kg/m   Assessment and Plan: 1. Vitamin D deficiency S/p high dose therapy for  4 months Will check labs and advise on maintenance dosing - Vitamin D (25 hydroxy)  2. Benign essential HTN Clinically stable exam with well controlled BP.   Tolerating medications, lisinopril 10 mg, without side effects at this time. Pt to continue current regimen and low sodium diet; benefits of regular exercise as able discussed.  3. Neurologic gait disorder S/p surgery for cauda equina syndrome Continues on gabapentin; bowel and bladder issues are stable Continue regular exercise for strengthening   Partially dictated using Animal nutritionistDragon software. Any errors are unintentional.  Bari EdwardLaura Aamirah Salmi, MD [facility]  03/06/2019

## 2019-03-07 ENCOUNTER — Other Ambulatory Visit: Payer: Self-pay | Admitting: Internal Medicine

## 2019-03-07 DIAGNOSIS — E559 Vitamin D deficiency, unspecified: Secondary | ICD-10-CM

## 2019-03-07 LAB — VITAMIN D 25 HYDROXY (VIT D DEFICIENCY, FRACTURES): Vit D, 25-Hydroxy: 29.3 ng/mL — ABNORMAL LOW (ref 30.0–100.0)

## 2019-03-07 MED ORDER — CHOLECALCIFEROL 1.25 MG (50000 UT) PO CAPS: 50000.0000 [IU] | ORAL_CAPSULE | ORAL | 3 refills | Status: DC

## 2019-05-30 ENCOUNTER — Other Ambulatory Visit: Payer: Self-pay | Admitting: Internal Medicine

## 2019-05-30 DIAGNOSIS — I10 Essential (primary) hypertension: Secondary | ICD-10-CM

## 2019-06-11 ENCOUNTER — Encounter: Payer: Self-pay | Admitting: Internal Medicine

## 2019-06-11 ENCOUNTER — Other Ambulatory Visit: Payer: Self-pay

## 2019-06-11 ENCOUNTER — Ambulatory Visit: Payer: BC Managed Care – PPO | Admitting: Internal Medicine

## 2019-06-11 VITALS — BP 132/84 | HR 63 | Ht 73.0 in | Wt 261.0 lb

## 2019-06-11 DIAGNOSIS — B359 Dermatophytosis, unspecified: Secondary | ICD-10-CM | POA: Diagnosis not present

## 2019-06-11 MED ORDER — KETOCONAZOLE 2 % EX CREA: 1.0000 "application " | TOPICAL_CREAM | Freq: Two times a day (BID) | CUTANEOUS | 0 refills | Status: DC

## 2019-06-11 NOTE — Progress Notes (Signed)
Date:  06/11/2019   Name:  Andrew [name]   DOB:  10-30-1970   MRN:  [id number]   Chief Complaint: Penis Sore (Been there for 3 months. Grown in size. No pain. No discharge or itching. )  Rash This is a chronic problem. The problem has been gradually worsening since onset. The affected locations include the genitalia (flat lesion on tip of penis - slowly enlarging). The rash is characterized by redness. He was exposed to nothing. Pertinent negatives include no fatigue, fever or shortness of breath. Past treatments include nothing.    Lab Results  Component Value Date   CREATININE 0.98 09/17/2018   BUN 11 09/17/2018   NA 141 09/17/2018   K 4.8 09/17/2018   CL 102 09/17/2018   CO2 26 09/17/2018   Lab Results  Component Value Date   CHOL 180 09/17/2018   HDL 42 09/17/2018   LDLCALC 104 (H) 09/17/2018   TRIG 171 (H) 09/17/2018   CHOLHDL 4.3 09/17/2018   Lab Results  Component Value Date   TSH 1.490 11/21/2017   Lab Results  Component Value Date   HGBA1C 5.6 09/17/2018   Lab Results  Component Value Date   WBC 6.2 09/17/2018   HGB 14.9 09/17/2018   HCT 44.7 09/17/2018   MCV 91 09/17/2018   PLT 248 09/17/2018   Lab Results  Component Value Date   ALT 28 09/17/2018   AST 23 09/17/2018   ALKPHOS 52 09/17/2018   BILITOT 0.6 09/17/2018     Review of Systems  Constitutional: Negative for chills, fatigue and fever.  Respiratory: Negative for chest tightness and shortness of breath.   Cardiovascular: Negative for chest pain.  Genitourinary: Negative for discharge and penile swelling.  Skin: Positive for rash.    Patient Active Problem List   Diagnosis Date Noted  . Pain of left hip joint 03/29/2018  . Hyperlipidemia, mild 11/22/2017  . Lumbar disc herniation   . Neurologic gait disorder   . Neuropathic pain   . Benign essential HTN   . Cauda equina syndrome ([place]) 06/07/2016  . Prediabetes 08/19/2015  . Vitamin D deficiency 07/14/2015  . B12 deficiency  07/14/2015  . Obesity (BMI 30-39.9) 10/05/2014  . Lumbar disc disease with radiculopathy 10/05/2014    Allergies  Allergen Reactions  . Hydrochlorothiazide Other (See Comments)    Severe cramping    Past Surgical History:  Procedure Laterality Date  . LUMBAR LAMINECTOMY/DECOMPRESSION MICRODISCECTOMY Bilateral 01/21/2015   Procedure: LUMBAR THREE-FOUR, LUMBAR FOUR-FIVE LUMBAR LAMINECTOMY/DECOMPRESSION MICRODISCECTOMY ;  Surgeon: Newman Pies, MD;  Location: [place] [facility];  Service: Neurosurgery;  Laterality: Bilateral;  L34 L45 laminectomies and foraminotomies  . LUMBAR LAMINECTOMY/DECOMPRESSION MICRODISCECTOMY N/A 06/07/2016   Procedure: LUMBAR LAMINECTOMY/DECOMPRESSION MICRODISCECTOMY, LUMBAR FOUR - FIVE;  Surgeon: Newman Pies, MD;  Location: [place];  Service: Neurosurgery;  Laterality: N/A;  . none  [phone]  . TONSILLECTOMY     as a child   . WISDOM TOOTH EXTRACTION     /w bone graft - also readying for implants     Social History   Tobacco Use  . Smoking status: Former Smoker    Quit date: 04/28/2002    Years since quitting: 17.1  . Smokeless tobacco: Never Used  Substance Use Topics  . Alcohol use: Yes    Alcohol/week: 0.0 standard drinks    Comment: beer q day   . Drug use: No     Medication list has been reviewed and updated.  Current Meds  Medication Sig  . Cholecalciferol 1.25 MG (50000 UT) capsule Take 1 capsule (50,000 Units total) by mouth once a week.  . gabapentin (NEURONTIN) 300 MG capsule TAKE 1 CAPSULE BY MOUTH TWICE A DAY  . lisinopril (ZESTRIL) 10 MG tablet TAKE 1 TABLET BY MOUTH EVERY DAY  . Multiple Vitamins-Minerals (MULTIVITAMIN ADULT PO) Take 1 tablet by mouth daily.   . valACYclovir (VALTREX) 1000 MG tablet Take 1 tablet (1,000 mg total) by mouth 3 (three) times daily. (Patient taking differently: Take 1,000 mg by mouth 3 (three) times daily. PRN)    PHQ 2/9 Scores 06/11/2019 03/06/2019 09/17/2018 12/26/2017  PHQ - 2 Score 0 0 0 0  PHQ- 9  Score 0 0 0 -    BP Readings from Last 3 Encounters:  06/11/19 132/84  03/06/19 130/82  12/18/18 (!) 146/78    Physical Exam Vitals and nursing note reviewed.  Constitutional:      General: He is not in acute distress.    Appearance: He is well-developed.  HENT:     Head: Normocephalic and atraumatic.  Pulmonary:     Effort: Pulmonary effort is normal. No respiratory distress.  Genitourinary:    Penis: Lesions present.      Testes: Normal.    Musculoskeletal:        General: Normal range of motion.  Skin:    General: Skin is warm and dry.     Findings: No rash.  Neurological:     Mental Status: He is alert and oriented to person, place, and time.  Psychiatric:        Behavior: Behavior normal.        Thought Content: Thought content normal.     Wt Readings from Last 3 Encounters:  06/11/19 261 lb (118.4 kg)  03/06/19 265 lb (120.2 kg)  09/17/18 260 lb 9.6 oz (118.2 kg)    BP 132/84   Pulse 63   Ht 6\' 1"  (1.854 m)   Wt 261 lb (118.4 kg)   SpO2 98%   BMI 34.43 kg/m   Assessment and Plan: 1. Tinea Suspect this is the cause of his lesion.  He has limited sensation so may not have noticed any itching Apply cream bid Call for referral if worsening - ketoconazole (NIZORAL) 2 % cream; Apply 1 application topically 2 (two) times daily. To lesion on penis  Dispense: 15 g; Refill: 0   Partially dictated using . Any errors are unintentional.  Animal nutritionist, MD [facility]  06/11/2019

## 2019-06-25 DIAGNOSIS — Z20828 Contact with and (suspected) exposure to other viral communicable diseases: Secondary | ICD-10-CM | POA: Diagnosis not present

## 2019-09-15 LAB — COMPREHENSIVE METABOLIC PANEL
Albumin: 3 — AB (ref 3.5–5.0)
Globulin: 2.5

## 2019-09-15 LAB — HEMOGLOBIN A1C: Hemoglobin A1C: 5.4

## 2019-09-15 LAB — LIPID PANEL
Cholesterol: 182 (ref 0–200)
HDL: 49 (ref 35–70)
LDL Cholesterol: 111
LDl/HDL Ratio: 3.7
Triglycerides: 108 (ref 40–160)

## 2019-09-15 LAB — HEPATIC FUNCTION PANEL
ALT: 22 (ref 10–40)
AST: 21 (ref 14–40)
Alkaline Phosphatase: 44 (ref 25–125)
Bilirubin, Total: 0.6

## 2019-09-15 LAB — BASIC METABOLIC PANEL
BUN: 22 — AB (ref 4–21)
Creatinine: 1 (ref 0.6–1.3)
Glucose: 92

## 2019-09-18 ENCOUNTER — Encounter: Payer: BC Managed Care – PPO | Admitting: Internal Medicine

## 2019-10-01 ENCOUNTER — Other Ambulatory Visit: Payer: Self-pay

## 2019-10-01 ENCOUNTER — Ambulatory Visit (INDEPENDENT_AMBULATORY_CARE_PROVIDER_SITE_OTHER): Payer: BC Managed Care – PPO | Admitting: Internal Medicine

## 2019-10-01 ENCOUNTER — Encounter: Payer: Self-pay | Admitting: Internal Medicine

## 2019-10-01 VITALS — BP 118/82 | HR 54 | Temp 97.7°F | Ht 73.0 in | Wt 246.0 lb

## 2019-10-01 DIAGNOSIS — G834 Cauda equina syndrome: Secondary | ICD-10-CM

## 2019-10-01 DIAGNOSIS — I10 Essential (primary) hypertension: Secondary | ICD-10-CM | POA: Diagnosis not present

## 2019-10-01 DIAGNOSIS — Z1159 Encounter for screening for other viral diseases: Secondary | ICD-10-CM | POA: Diagnosis not present

## 2019-10-01 DIAGNOSIS — E785 Hyperlipidemia, unspecified: Secondary | ICD-10-CM

## 2019-10-01 DIAGNOSIS — Z0000 Encounter for general adult medical examination without abnormal findings: Secondary | ICD-10-CM

## 2019-10-01 DIAGNOSIS — E559 Vitamin D deficiency, unspecified: Secondary | ICD-10-CM

## 2019-10-01 DIAGNOSIS — E538 Deficiency of other specified B group vitamins: Secondary | ICD-10-CM

## 2019-10-01 DIAGNOSIS — R7303 Prediabetes: Secondary | ICD-10-CM

## 2019-10-01 DIAGNOSIS — M67432 Ganglion, left wrist: Secondary | ICD-10-CM | POA: Insufficient documentation

## 2019-10-01 NOTE — Progress Notes (Signed)
Date:  10/01/2019   Name:  Andrew [name]   DOB:  1970-06-08   MRN:  [id number]   Chief Complaint: Annual Exam ( checked bp 89/59 light headed X2-3 days ago )  Andrew [name] is a 49 y.o. male who presents today for his Complete Annual Exam. He feels well. He reports exercising twice a week hot yoga. He reports he is sleeping well.    Immunization History  Administered Date(s) Administered  . Influenza,inj,Quad PF,6+ Mos 12/18/2018  . Moderna SARS-COVID-2 Vaccination 06/04/2019, 06/27/2019    Hypertension This is a chronic problem. The problem is controlled (bp was low at home earlier this week - felt lightheaded). Pertinent negatives include no chest pain, headaches, palpitations or shortness of breath. Past treatments include ACE inhibitors. The current treatment provides significant improvement.  Vitamin D def - he has been on high dose weekly supplement and levels are still low.  Gait disorder due to cauda equina syndrome - he reports doing fairly well.  He is gaining strength in his leg.  He still takes gabapentin.  Bowel and bladder changes continue to slowly improve. Numbness in left foot is unchanged. Mass at left wrist - noticed this a few months ago.  It is not changing.  It is not painful and does not interfer with ADLs. Lab Results  Component Value Date   CREATININE 0.98 09/17/2018   BUN 11 09/17/2018   NA 141 09/17/2018   K 4.8 09/17/2018   CL 102 09/17/2018   CO2 26 09/17/2018   Lab Results  Component Value Date   CHOL 180 09/17/2018   HDL 42 09/17/2018   LDLCALC 104 (H) 09/17/2018   TRIG 171 (H) 09/17/2018   CHOLHDL 4.3 09/17/2018   Lab Results  Component Value Date   TSH 1.490 11/21/2017   Lab Results  Component Value Date   HGBA1C 5.6 09/17/2018   Lab Results  Component Value Date   WBC 6.2 09/17/2018   HGB 14.9 09/17/2018   HCT 44.7 09/17/2018   MCV 91 09/17/2018   PLT 248 09/17/2018   Lab Results  Component Value Date   ALT 28 09/17/2018   AST  23 09/17/2018   ALKPHOS 52 09/17/2018   BILITOT 0.6 09/17/2018     Review of Systems  Constitutional: Negative for appetite change, chills, diaphoresis, fatigue and unexpected weight change (losing weight with diet and exercise).  HENT: Negative for hearing loss, tinnitus, trouble swallowing and voice change.   Eyes: Negative for visual disturbance.  Respiratory: Negative for choking, shortness of breath and wheezing.   Cardiovascular: Negative for chest pain, palpitations and leg swelling.  Gastrointestinal: Negative for abdominal pain, blood in stool, constipation and diarrhea.  Genitourinary: Negative for dysuria, frequency and hematuria.  Musculoskeletal: Positive for arthralgias and gait problem. Negative for back pain and myalgias.  Skin: Negative for color change and rash.  Allergic/Immunologic: Negative for environmental allergies.  Neurological: Positive for light-headedness and numbness (in left foot). Negative for dizziness, syncope and headaches.  Hematological: Negative for adenopathy.  Psychiatric/Behavioral: Negative for dysphoric mood and sleep disturbance.    Patient Active Problem List   Diagnosis Date Noted  . Pain of left hip joint 03/29/2018  . Hyperlipidemia, mild 11/22/2017  . Lumbar disc herniation   . Neurologic gait disorder   . Neuropathic pain   . Benign essential HTN   . Cauda equina syndrome (HCC) 06/07/2016  . Prediabetes 08/19/2015  . Vitamin D deficiency 07/14/2015  . B12 deficiency 07/14/2015  .  Obesity (BMI 30-39.9) 10/05/2014  . Lumbar disc disease with radiculopathy 10/05/2014    Allergies  Allergen Reactions  . Hydrochlorothiazide Other (See Comments)    Severe cramping    Past Surgical History:  Procedure Laterality Date  . LUMBAR LAMINECTOMY/DECOMPRESSION MICRODISCECTOMY Bilateral 01/21/2015   Procedure: LUMBAR THREE-FOUR, LUMBAR FOUR-FIVE LUMBAR LAMINECTOMY/DECOMPRESSION MICRODISCECTOMY ;  Surgeon: Tressie Stalker, MD;  Location:  [facility];  Service: Neurosurgery;  Laterality: Bilateral;  L34 L45 laminectomies and foraminotomies  . LUMBAR LAMINECTOMY/DECOMPRESSION MICRODISCECTOMY N/A 06/07/2016   Procedure: LUMBAR LAMINECTOMY/DECOMPRESSION MICRODISCECTOMY, LUMBAR FOUR - FIVE;  Surgeon: Tressie Stalker, MD;  Location: [facility];  Service: Neurosurgery;  Laterality: N/A;  . none  [phone]  . TONSILLECTOMY     as a child   . WISDOM TOOTH EXTRACTION     /w bone graft - also readying for implants     Social History   Tobacco Use  . Smoking status: Former Smoker    Quit date: 04/28/2002    Years since quitting: 17.4  . Smokeless tobacco: Never Used  Substance Use Topics  . Alcohol use: Yes    Alcohol/week: 0.0 standard drinks    Comment: beer q day   . Drug use: No     Medication list has been reviewed and updated.  Current Meds  Medication Sig  . Cholecalciferol 1.25 MG (50000 UT) capsule Take 1 capsule (50,000 Units total) by mouth once a week.  . gabapentin (NEURONTIN) 300 MG capsule TAKE 1 CAPSULE BY MOUTH TWICE A DAY  . lisinopril (ZESTRIL) 10 MG tablet TAKE 1 TABLET BY MOUTH EVERY DAY  . Multiple Vitamins-Minerals (MULTIVITAMIN ADULT PO) Take 1 tablet by mouth daily.   . valACYclovir (VALTREX) 1000 MG tablet Take 1 tablet (1,000 mg total) by mouth 3 (three) times daily. (Patient taking differently: Take 1,000 mg by mouth 3 (three) times daily. PRN)    PHQ 2/9 Scores 10/01/2019 06/11/2019 03/06/2019 09/17/2018  PHQ - 2 Score 0 0 0 0  PHQ- 9 Score 0 0 0 0    GAD 7 : Generalized Anxiety Score 10/01/2019  Nervous, Anxious, on Edge 0  Control/stop worrying 0  Worry too much - different things 0  Trouble relaxing 0  Restless 0  Easily annoyed or irritable 0  Afraid - awful might happen 0  Total GAD 7 Score 0  Anxiety Difficulty Not difficult at all    BP Readings from Last 3 Encounters:  10/01/19 118/82  06/11/19 132/84  03/06/19 130/82    Physical Exam Vitals and nursing note reviewed.    Constitutional:      Appearance: Normal appearance. He is well-developed.  HENT:     Head: Normocephalic.     Right Ear: Tympanic membrane, ear canal and external ear normal.     Left Ear: Tympanic membrane, ear canal and external ear normal.     Nose: Nose normal.     Mouth/Throat:     Pharynx: Uvula midline.  Eyes:     Conjunctiva/sclera: Conjunctivae normal.     Pupils: Pupils are equal, round, and reactive to light.  Neck:     Thyroid: No thyromegaly.     Vascular: No carotid bruit.  Cardiovascular:     Rate and Rhythm: Normal rate and regular rhythm.     Heart sounds: Normal heart sounds.  Pulmonary:     Effort: Pulmonary effort is normal.     Breath sounds: Normal breath sounds. No wheezing.  Chest:     Breasts:  Right: No mass.        Left: No mass.  Abdominal:     General: Bowel sounds are normal.     Palpations: Abdomen is soft.     Tenderness: There is no abdominal tenderness.  Musculoskeletal:     Cervical back: Normal range of motion and neck supple.     Right lower leg: No edema.     Left lower leg: No edema.     Comments: 1 cm ganglion cyst left wrist  Lymphadenopathy:     Cervical: No cervical adenopathy.  Skin:    General: Skin is warm and dry.     Capillary Refill: Capillary refill takes less than 2 seconds.  Neurological:     Mental Status: He is alert and oriented to person, place, and time. Mental status is at baseline.     Sensory: Sensory deficit (left foot) present.     Gait: Gait abnormal.     Deep Tendon Reflexes: Reflexes are normal and symmetric.  Psychiatric:        Mood and Affect: Mood normal.        Speech: Speech normal.        Behavior: Behavior normal.     Wt Readings from Last 3 Encounters:  10/01/19 246 lb (111.6 kg)  06/11/19 261 lb (118.4 kg)  03/06/19 265 lb (120.2 kg)    BP 118/82   Pulse (!) 54   Temp 97.7 F (36.5 C) (Oral)   Ht 6\' 1"  (1.854 m)   Wt 246 lb (111.6 kg)   SpO2 99%   BMI 32.46 kg/m    Assessment and Plan: 1. Annual physical exam Normal exam except for weight He is losing steadily with diet and exercise  2. Need for hepatitis C screening test - Hepatitis C antibody  3. Benign essential HTN Clinically stable exam with well controlled BP. He will monitor BP at home regularly and call if systolic < 120 consistently for a lower dose of lisinopril Pt to continue current regimen and low sodium diet; benefits of regular exercise as able discussed. - CBC with Differential/Platelet  4. Cauda equina syndrome [facility]) He has residual deficits that continue to gradually improve. Gait changes are chronic, as is left foot numbness  5. Hyperlipidemia, mild Labs done at insurance exam were normal - he will forward them to me  6. Prediabetes Recent A1C was reported normal - will wait for forwarded labs  7. B12 deficiency Check today - Vitamin B12  8. Vitamin D deficiency Continue to supplement - VITAMIN D 25 Hydroxy (Vit-D Deficiency, Fractures)  9. Ganglion cyst of wrist, left Pt is educated - no intervention is needed at this time since it is not causing any symptoms   Partially dictated using [facility]. Any errors are unintentional.  Animal nutritionist, MD [facility]  10/01/2019

## 2019-10-02 LAB — HEPATITIS C ANTIBODY: Hep C Virus Ab: <0.1 s/co ratio (ref 0.0–0.9)

## 2019-10-02 LAB — CBC WITH DIFFERENTIAL/PLATELET
Basophils Absolute: 0 10*3/uL (ref 0.0–0.2)
Basos: 0 %
EOS (ABSOLUTE): 0.1 10*3/uL (ref 0.0–0.4)
Eos: 2 %
Hematocrit: 42.2 % (ref 37.5–51.0)
Hemoglobin: 13.6 g/dL (ref 13.0–17.7)
Immature Grans (Abs): 0 10*3/uL (ref 0.0–0.1)
Immature Granulocytes: 0 %
Lymphocytes Absolute: 1.8 10*3/uL (ref 0.7–3.1)
Lymphs: 37 %
MCH: 29.4 pg (ref 26.6–33.0)
MCHC: 32.2 g/dL (ref 31.5–35.7)
MCV: 91 fL (ref 79–97)
Monocytes Absolute: 0.4 10*3/uL (ref 0.1–0.9)
Monocytes: 7 %
Neutrophils Absolute: 2.7 10*3/uL (ref 1.4–7.0)
Neutrophils: 54 %
Platelets: 152 10*3/uL (ref 150–450)
RBC: 4.63 x10E6/uL (ref 4.14–5.80)
RDW: 12.9 % (ref 11.6–15.4)
WBC: 5 10*3/uL (ref 3.4–10.8)

## 2019-10-02 LAB — VITAMIN D 25 HYDROXY (VIT D DEFICIENCY, FRACTURES): Vit D, 25-Hydroxy: 72.2 ng/mL (ref 30.0–100.0)

## 2019-10-02 LAB — VITAMIN B12: Vitamin B-12: 469 pg/mL (ref 232–1245)

## 2019-11-16 ENCOUNTER — Encounter: Payer: Self-pay | Admitting: Internal Medicine

## 2019-11-17 ENCOUNTER — Other Ambulatory Visit: Payer: Self-pay | Admitting: Internal Medicine

## 2019-11-17 DIAGNOSIS — N3281 Overactive bladder: Secondary | ICD-10-CM | POA: Insufficient documentation

## 2019-11-24 ENCOUNTER — Other Ambulatory Visit: Payer: Self-pay | Admitting: *Deleted

## 2019-11-24 DIAGNOSIS — N3281 Overactive bladder: Secondary | ICD-10-CM

## 2019-11-25 ENCOUNTER — Encounter: Payer: Self-pay | Admitting: Urology

## 2019-11-25 ENCOUNTER — Other Ambulatory Visit: Payer: Self-pay

## 2019-11-25 ENCOUNTER — Ambulatory Visit: Payer: BC Managed Care – PPO | Admitting: Urology

## 2019-11-25 ENCOUNTER — Other Ambulatory Visit
Admission: RE | Admit: 2019-11-25 | Discharge: 2019-11-25 | Disposition: A | Discharge status: Home / Self Care | Payer: BC Managed Care – PPO | Attending: Urology | Admitting: Urology

## 2019-11-25 VITALS — BP 139/84 | HR 61 | Ht 73.0 in | Wt 236.0 lb

## 2019-11-25 DIAGNOSIS — N3281 Overactive bladder: Secondary | ICD-10-CM | POA: Insufficient documentation

## 2019-11-25 LAB — URINALYSIS, COMPLETE (UACMP) WITH MICROSCOPIC
Bacteria, UA: NONE SEEN
Bilirubin Urine: NEGATIVE
Glucose, UA: NEGATIVE mg/dL
Hgb urine dipstick: NEGATIVE
Ketones, ur: NEGATIVE mg/dL
Leukocytes,Ua: NEGATIVE
Nitrite: NEGATIVE
Protein, ur: NEGATIVE mg/dL
RBC / HPF: NONE SEEN RBC/hpf (ref 0–5)
Specific Gravity, Urine: 1.015 (ref 1.005–1.030)
WBC, UA: NONE SEEN WBC/hpf (ref 0–5)
pH: 6.5 (ref 5.0–8.0)

## 2019-11-25 LAB — BLADDER SCAN AMB NON-IMAGING: Scan Result: 37

## 2019-11-25 MED ORDER — OXYBUTYNIN CHLORIDE ER 10 MG PO TB24: 10.0000 mg | ORAL_TABLET | Freq: Every day | ORAL | 11 refills | Status: DC

## 2019-11-25 NOTE — Progress Notes (Signed)
11/25/19 2:19 PM   Andrew [name] May 23, 1970 [id number]  CC: OAB, urinary symptoms  HPI: I saw [name] in urology clinic for evaluation of the above issues.  He is a healthy 49 year old male with history of a spinal cord injury and cauda equina in 2018 with bothersome urinary symptoms since that time.  He had a Foley catheter placed for 2 weeks acutely at the time of injury, but has voided spontaneously since then.  He has never undergone urodynamics or further evaluation.  He denies any other episodes of urinary retention or UTIs.  His primary complaint is urinary urgency and frequency during the day, and he voids every 2 hours.  He also has nocturia 1-4 times overnight depending on how much he drinks.  PVR is normal today at 37.  He has never tried any medications for his urinary symptoms.  He drinks only water during the day.   IPSS score today is 18, with quality of life mixed.  Urinalysis today is pending.   PMH: Past Medical History:  Diagnosis Date  . Arthritis   . Hypertension   . Neurogenic bladder   . Neurogenic bowel   . Neuromuscular disorder (HCC)    lumbar spinal stenosis      Surgical History: Past Surgical History:  Procedure Laterality Date  . LUMBAR LAMINECTOMY/DECOMPRESSION MICRODISCECTOMY Bilateral 01/21/2015   Procedure: LUMBAR THREE-FOUR, LUMBAR FOUR-FIVE LUMBAR LAMINECTOMY/DECOMPRESSION MICRODISCECTOMY ;  Surgeon: Tressie Stalker, MD;  Location: [facility];  Service: Neurosurgery;  Laterality: Bilateral;  L34 L45 laminectomies and foraminotomies  . LUMBAR LAMINECTOMY/DECOMPRESSION MICRODISCECTOMY N/A 06/07/2016   Procedure: LUMBAR LAMINECTOMY/DECOMPRESSION MICRODISCECTOMY, LUMBAR FOUR - FIVE;  Surgeon: Tressie Stalker, MD;  Location: [facility];  Service: Neurosurgery;  Laterality: N/A;  . none  [id number]  . TONSILLECTOMY     as a child   . WISDOM TOOTH EXTRACTION     /w bone graft - also readying for implants     Family History: Family History  Problem  Relation Age of Onset  . Hypertension Mother   . CAD Mother 63  . Diabetes Mother   . Lymphoma Brother   . Liver disease Father        liver transplant for hep C    Social History:  reports that he quit smoking about 17 years ago. He has never used smokeless tobacco. He reports current alcohol use. He reports that he does not use drugs.  Physical Exam: BP 139/84   Pulse 61   Ht 6\' 1"  (1.854 m)   Wt 236 lb (107 kg)   BMI 31.14 kg/m    Constitutional:  Alert and oriented, No acute distress. Cardiovascular: No clubbing, cyanosis, or edema. Respiratory: Normal respiratory effort, no increased work of breathing. GI: Abdomen is soft, nontender, nondistended, no abdominal masses  Laboratory Data: Urinalysis pending  Assessment & Plan:   In summary, is a 49 year old male with history of a spinal cord injury and persistent overactive urinary symptoms since that time.  He is emptying well with a normal PVR 37 mL today.  He has no history of urinary tract infections or retention outside of having a Foley catheter at the time of his acute injury.  He is mildly bothered by symptoms of urgency and frequency during the day and night despite limiting his diet to water only.  We discussed that overactive bladder (OAB) is not a disease, but is a symptom complex that is generally not life-threatening.  Symptoms typically include urinary urgency, frequency, and urge  incontinence.  There are numerous treatment options, however there are risks and benefits with both medical and surgical management.  First-line treatment is behavioral therapies including bladder training, pelvic floor muscle training, and fluid management.  Second line treatments include oral antimuscarinics(Ditropan er, Trospium) and beta-3 agonist (Mybetriq). There is typically a period of medication trial (4-8 weeks) to find the optimal therapy and dosing. If symptoms are bothersome despite the above management, third line options include  intra-detrusor botox, peripheral tibial nerve stimulation (PTNS), and interstim (SNS). These are more invasive treatments with higher side effect profile, but may improve quality of life for patients with severe OAB symptoms.   Call with urinalysis results Trial of oxybutynin 10 mg XL daily RTC 6 weeks for symptom check and repeat PVR    Andrew Rams, MD 11/25/2019  [facility] [address], [address] [place], [place] [address] [phone]

## 2019-11-25 NOTE — Patient Instructions (Signed)

## 2019-12-02 ENCOUNTER — Other Ambulatory Visit: Payer: Self-pay | Admitting: Internal Medicine

## 2019-12-02 DIAGNOSIS — I10 Essential (primary) hypertension: Secondary | ICD-10-CM

## 2019-12-02 NOTE — Telephone Encounter (Signed)
Requested Prescriptions  Pending Prescriptions Disp Refills  . gabapentin (NEURONTIN) 300 MG capsule [Pharmacy Med Name: GABAPENTIN 300 MG CAPSULE] 180 capsule 1    Sig: TAKE 1 CAPSULE BY MOUTH TWICE A DAY     Neurology: Anticonvulsants - gabapentin Passed - 12/02/2019  1:34 AM      Passed - Valid encounter within last 12 months    Recent Outpatient Visits          2 months ago Annual physical exam   [facility] [name], MD   5 months ago Tinea   [facility] [name], [facility]   9 months ago Vitamin D deficiency   [facility] [name], MD   1 year ago Annual physical exam   [facility] [name], MD   1 year ago Herpes zoster without complication   [facility] [name], MD      Future Appointments            In 1 month Richardo Hanks Laurette Schimke, MD [facility] [place]   In 4 months [name], MD [facility]   In 10 months [name], MD [facility], [facility]           . lisinopril (ZESTRIL) 10 MG tablet [Pharmacy Med Name: LISINOPRIL 10 MG TABLET] 90 tablet 1    Sig: TAKE 1 TABLET BY MOUTH EVERY DAY     Cardiovascular:  ACE Inhibitors Failed - 12/02/2019  1:34 AM      Failed - K in normal range and within 180 days    Potassium  Date Value Ref Range Status  09/17/2018 4.8 3.5 - 5.2 mmol/L Final         Passed - Cr in normal range and within 180 days    Creatinine  Date Value Ref Range Status  09/15/2019 1.0 0.6 - 1.3 Final   Creatinine, Ser  Date Value Ref Range Status  09/17/2018 0.98 0.76 - 1.27 mg/dL Final         Passed - Patient is not pregnant      Passed - Last BP in normal range    BP Readings from Last 1 Encounters:  11/25/19 139/84         Passed - Valid encounter within last 6 months    Recent Outpatient Visits          2 months ago Annual physical exam   [facility] [name], MD   5 months ago Tinea    [facility] [name], MD   9 months ago Vitamin D deficiency   [facility] [name], MD   1 year ago Annual physical exam   [facility] [name], MD   1 year ago Herpes zoster without complication   [facility] [name], MD      Future Appointments            In 1 month Richardo Hanks, Laurette Schimke, MD [facility] [place]   In 4 months [name], MD [facility], [facility]   In 10 months Judithann Graves Nyoka Cowden, MD [facility]

## 2019-12-24 ENCOUNTER — Encounter: Payer: Self-pay | Admitting: Internal Medicine

## 2020-01-13 ENCOUNTER — Ambulatory Visit: Payer: BC Managed Care – PPO | Admitting: Urology

## 2020-02-13 ENCOUNTER — Encounter: Payer: Self-pay | Admitting: Internal Medicine

## 2020-02-15 ENCOUNTER — Other Ambulatory Visit: Payer: Self-pay | Admitting: Internal Medicine

## 2020-02-15 DIAGNOSIS — I10 Essential (primary) hypertension: Secondary | ICD-10-CM

## 2020-02-15 MED ORDER — LISINOPRIL 20 MG PO TABS: 20.0000 mg | ORAL_TABLET | Freq: Every day | ORAL | 0 refills | Status: DC

## 2020-02-16 ENCOUNTER — Other Ambulatory Visit: Payer: Self-pay | Admitting: Internal Medicine

## 2020-02-16 DIAGNOSIS — M67432 Ganglion, left wrist: Secondary | ICD-10-CM

## 2020-02-16 NOTE — Telephone Encounter (Signed)
Please Advise.  KP

## 2020-03-10 ENCOUNTER — Ambulatory Visit: Payer: BC Managed Care – PPO | Admitting: Internal Medicine

## 2020-03-10 ENCOUNTER — Other Ambulatory Visit: Payer: Self-pay

## 2020-03-10 ENCOUNTER — Encounter: Payer: Self-pay | Admitting: Internal Medicine

## 2020-03-10 VITALS — BP 132/86 | HR 62 | Temp 98.4°F | Ht 73.0 in | Wt 238.0 lb

## 2020-03-10 DIAGNOSIS — I10 Essential (primary) hypertension: Secondary | ICD-10-CM

## 2020-03-10 DIAGNOSIS — N3281 Overactive bladder: Secondary | ICD-10-CM | POA: Diagnosis not present

## 2020-03-10 NOTE — Progress Notes (Signed)
Date:  03/10/2020   Name:  Andrew [name]   DOB:  [birth year]   MRN:  [id number]   Chief Complaint: Hypertension  Hypertension This is a chronic problem. The problem has been gradually improving since onset. The problem is controlled. Pertinent negatives include no chest pain, headaches, palpitations or shortness of breath. Past treatments include ACE inhibitors (had decreased lisinopril to 10 mg but then BP increased again.  Lisinopril now at 20 mg per day).  He is working on reducing his work stress, he is back doing yoga daily and is feeling much better.  His BP the last few days has been normal.   Lab Results  Component Value Date   CREATININE 1.0 09/15/2019   BUN 22 (A) 09/15/2019   NA 141 09/17/2018   K 4.8 09/17/2018   CL 102 09/17/2018   CO2 26 09/17/2018   Lab Results  Component Value Date   CHOL 182 09/15/2019   HDL 49 09/15/2019   LDLCALC 111 09/15/2019   TRIG 108 09/15/2019   CHOLHDL 4.3 09/17/2018   Lab Results  Component Value Date   TSH 1.490 11/21/2017   Lab Results  Component Value Date   HGBA1C 5.4 09/15/2019   Lab Results  Component Value Date   WBC 5.0 10/01/2019   HGB 13.6 10/01/2019   HCT 42.2 10/01/2019   MCV 91 10/01/2019   PLT 152 10/01/2019   Lab Results  Component Value Date   ALT 22 09/15/2019   AST 21 09/15/2019   ALKPHOS 44 09/15/2019   BILITOT 0.6 09/17/2018     Review of Systems  Constitutional: Negative for appetite change, fatigue and unexpected weight change.  Eyes: Negative for visual disturbance.  Respiratory: Negative for cough, shortness of breath and wheezing.   Cardiovascular: Negative for chest pain, palpitations and leg swelling.  Gastrointestinal: Negative for abdominal pain and blood in stool.  Genitourinary: Positive for frequency (he did not try the ditropan).  Musculoskeletal: Positive for back pain and gait problem.  Skin: Negative for color change and rash.  Neurological: Negative for tremors, numbness and  headaches.  Psychiatric/Behavioral: Negative for dysphoric mood and sleep disturbance. The patient is not nervous/anxious.     Patient Active Problem List   Diagnosis Date Noted  . OAB (overactive bladder) 11/17/2019  . Ganglion cyst of wrist, left 10/01/2019  . Pain of left hip joint 03/29/2018  . Hyperlipidemia, mild 11/22/2017  . Lumbar disc herniation   . Neurologic gait disorder   . Neuropathic pain   . Benign essential HTN   . Cauda equina syndrome (HCC) 06/07/2016  . Prediabetes 08/19/2015  . Vitamin D deficiency 07/14/2015  . B12 deficiency 07/14/2015  . Obesity (BMI 30-39.9) 10/05/2014  . Lumbar disc disease with radiculopathy 10/05/2014    Allergies  Allergen Reactions  . Hydrochlorothiazide Other (See Comments)    Severe cramping    Past Surgical History:  Procedure Laterality Date  . LUMBAR LAMINECTOMY/DECOMPRESSION MICRODISCECTOMY Bilateral 01/21/2015   Procedure: LUMBAR THREE-FOUR, LUMBAR FOUR-FIVE LUMBAR LAMINECTOMY/DECOMPRESSION MICRODISCECTOMY ;  Surgeon: Tressie Stalker, MD;  Location: [facility];  Service: Neurosurgery;  Laterality: Bilateral;  L34 L45 laminectomies and foraminotomies  . LUMBAR LAMINECTOMY/DECOMPRESSION MICRODISCECTOMY N/A 06/07/2016   Procedure: LUMBAR LAMINECTOMY/DECOMPRESSION MICRODISCECTOMY, LUMBAR FOUR - FIVE;  Surgeon: Tressie Stalker, MD;  Location: [facility];  Service: Neurosurgery;  Laterality: N/A;  . none  [id number]  . TONSILLECTOMY     as a child   . WISDOM TOOTH EXTRACTION     /  w bone graft - also readying for implants     Social History   Tobacco Use  . Smoking status: Former Smoker    Quit date: 04/28/2002    Years since quitting: 17.8  . Smokeless tobacco: Never Used  Substance Use Topics  . Alcohol use: Yes    Alcohol/week: 0.0 standard drinks    Comment: beer q day   . Drug use: No     Medication list has been reviewed and updated.  Current Meds  Medication Sig  . Cholecalciferol 1.25 MG (50000 UT) capsule  Take 1 capsule (50,000 Units total) by mouth once a week.  . gabapentin (NEURONTIN) 300 MG capsule TAKE 1 CAPSULE BY MOUTH TWICE A DAY  . lisinopril (ZESTRIL) 20 MG tablet Take 1 tablet (20 mg total) by mouth daily.  . Multiple Vitamins-Minerals (MULTIVITAMIN ADULT PO) Take 1 tablet by mouth daily.   . valACYclovir (VALTREX) 1000 MG tablet Take 1 tablet (1,000 mg total) by mouth 3 (three) times daily.    PHQ 2/9 Scores 03/10/2020 10/01/2019 06/11/2019 03/06/2019  PHQ - 2 Score 0 0 0 0  PHQ- 9 Score 0 0 0 0    GAD 7 : Generalized Anxiety Score 03/10/2020 10/01/2019  Nervous, Anxious, on Edge 0 0  Control/stop worrying 0 0  Worry too much - different things 0 0  Trouble relaxing 0 0  Restless 0 0  Easily annoyed or irritable 0 0  Afraid - awful might happen 0 0  Total GAD 7 Score 0 0  Anxiety Difficulty - Not difficult at all    BP Readings from Last 3 Encounters:  03/10/20 132/86  11/25/19 139/84  10/01/19 118/82    Physical Exam Vitals and nursing note reviewed.  Constitutional:      General: He is not in acute distress.    Appearance: He is well-developed.  HENT:     Head: Normocephalic and atraumatic.  Cardiovascular:     Rate and Rhythm: Normal rate and regular rhythm.     Pulses: Normal pulses.     Heart sounds: No murmur heard.   Pulmonary:     Effort: Pulmonary effort is normal. No respiratory distress.  Musculoskeletal:        General: Normal range of motion.     Cervical back: Normal range of motion.     Right lower leg: No edema.     Left lower leg: No edema.  Lymphadenopathy:     Cervical: No cervical adenopathy.  Skin:    General: Skin is warm and dry.     Capillary Refill: Capillary refill takes less than 2 seconds.     Findings: No rash.  Neurological:     General: No focal deficit present.     Mental Status: He is alert and oriented to person, place, and time.  Psychiatric:        Mood and Affect: Mood and affect and mood normal.        Behavior:  Behavior normal.     Wt Readings from Last 3 Encounters:  03/10/20 238 lb (108 kg)  11/25/19 236 lb (107 kg)  10/01/19 246 lb (111.6 kg)    BP 132/86   Pulse 62   Temp 98.4 F (36.9 C) (Oral)   Ht 6\' 1"  (1.854 m)   Wt 238 lb (108 kg)   SpO2 98%   BMI 31.40 kg/m   Assessment and Plan: 1. Benign essential HTN Clinically stable exam with well controlled BP on lisinopril  20 mg. Tolerating medications without side effects at this time. Pt to continue current regimen and low sodium diet; benefits of regular exercise as able discussed. Monitor BP at home, at rest, several times a week or less. Follow up if continued issues  2. OAB (overactive bladder) Evaluated by Urology - felt to be OAB and ditropan prescribed. Pt did not take the medication- he mainly wanted the urology consult to verify that there is nothing neurologically wrong.  He can deal with the frequent urination.   Partially dictated using Animal nutritionist. Any errors are unintentional.  Bari Edward, MD [facility]  03/10/2020

## 2020-03-22 DIAGNOSIS — Z03818 Encounter for observation for suspected exposure to other biological agents ruled out: Secondary | ICD-10-CM | POA: Diagnosis not present

## 2020-04-02 ENCOUNTER — Ambulatory Visit: Payer: BC Managed Care – PPO | Admitting: Internal Medicine

## 2020-04-05 ENCOUNTER — Ambulatory Visit: Payer: BC Managed Care – PPO | Admitting: Internal Medicine

## 2020-04-10 ENCOUNTER — Other Ambulatory Visit: Payer: Self-pay | Admitting: Internal Medicine

## 2020-04-10 DIAGNOSIS — E559 Vitamin D deficiency, unspecified: Secondary | ICD-10-CM

## 2020-04-11 NOTE — Telephone Encounter (Signed)
Requested medication (s) are due for refill today: yes  Requested medication (s) are on the active medication list: yes  Last refill:  01/14/2020  Future visit scheduled: yes  Notes to clinic: this refill cannot be delegated    Requested Prescriptions  Pending Prescriptions Disp Refills   Cholecalciferol (VITAMIN D3) 1.25 MG (50000 UT) CAPS [Pharmacy Med Name: VITAMIN D3 50,000 UNIT CAPSULE] 12 capsule 3    Sig: TAKE 1 CAPSULE BY MOUTH ONE TIME PER WEEK      Endocrinology:  Vitamins - Vitamin D Supplementation Failed - 04/10/2020  9:39 AM      Failed - 50,000 IU strengths are not delegated      Failed - Ca in normal range and within 360 days    Calcium  Date Value Ref Range Status  09/17/2018 10.0 8.7 - 10.2 mg/dL Final          Failed - Phosphate in normal range and within 360 days    No results found for: PHOS        Passed - Vitamin D in normal range and within 360 days    Vit D, 25-Hydroxy  Date Value Ref Range Status  10/01/2019 72.2 30.0 - 100.0 ng/mL Final    Comment:    Vitamin D deficiency has been defined by the Institute of Medicine and an Endocrine Society practice guideline as a level of serum 25-OH vitamin D less than 20 ng/mL (1,2). The Endocrine Society went on to further define vitamin D insufficiency as a level between 21 and 29 ng/mL (2). 1. IOM (Institute of Medicine). 2010. Dietary reference    intakes for calcium and D. [place]: The    [employer]. 2. Holick MF, Binkley [place], Bischoff-Ferrari HA, et al.    Evaluation, treatment, and prevention of vitamin D    deficiency: an Endocrine Society clinical practice    guideline. JCEM. 2011 Jul; 96(7):1911-30.           Passed - Valid encounter within last 12 months    Recent Outpatient Visits           1 month ago Benign essential HTN   [facility] [name], MD   6 months ago Annual physical exam   [facility] [name], MD   10 months ago  Tinea   [facility] [name], MD   1 year ago Vitamin D deficiency   [facility] [name], MD   1 year ago Annual physical exam   [facility] [name], MD       Future Appointments             In 5 months Judithann Graves Nyoka Cowden, MD [facility]

## 2020-04-29 ENCOUNTER — Encounter: Payer: Self-pay | Admitting: Internal Medicine

## 2020-04-29 ENCOUNTER — Other Ambulatory Visit: Payer: Self-pay | Admitting: Internal Medicine

## 2020-04-29 DIAGNOSIS — M25552 Pain in left hip: Secondary | ICD-10-CM

## 2020-05-07 DIAGNOSIS — M25552 Pain in left hip: Secondary | ICD-10-CM | POA: Diagnosis not present

## 2020-05-09 ENCOUNTER — Other Ambulatory Visit: Payer: Self-pay | Admitting: Internal Medicine

## 2020-05-09 DIAGNOSIS — I10 Essential (primary) hypertension: Secondary | ICD-10-CM

## 2020-05-25 DIAGNOSIS — M160 Bilateral primary osteoarthritis of hip: Secondary | ICD-10-CM | POA: Diagnosis not present

## 2020-06-15 ENCOUNTER — Other Ambulatory Visit: Payer: Self-pay | Admitting: Internal Medicine

## 2020-10-04 ENCOUNTER — Encounter: Payer: BC Managed Care – PPO | Admitting: Internal Medicine

## 2020-10-04 NOTE — Progress Notes (Deleted)
Date:  10/04/2020   Name:  Andrew [name]   DOB:  October 04, 1970   MRN:  [id number]   Chief Complaint: No chief complaint on file. Andrew [name] is a 50 y.o. male who presents today for his Complete Annual Exam. He feels {DESC; WELL/FAIRLY WELL/POORLY:18703}. He reports exercising ***. He reports he is sleeping {DESC; WELL/FAIRLY WELL/POORLY:18703}.   Colonoscopy: none  Immunization History  Administered Date(s) Administered   Influenza,inj,Quad PF,6+ Mos 12/18/2018   Moderna Sars-Covid-2 Vaccination 06/04/2019, 06/27/2019, 02/27/2020    HPI  Lab Results  Component Value Date   CREATININE 1.0 09/15/2019   BUN 22 (A) 09/15/2019   NA 141 09/17/2018   K 4.8 09/17/2018   CL 102 09/17/2018   CO2 26 09/17/2018   Lab Results  Component Value Date   CHOL 182 09/15/2019   HDL 49 09/15/2019   LDLCALC 111 09/15/2019   TRIG 108 09/15/2019   CHOLHDL 4.3 09/17/2018   Lab Results  Component Value Date   TSH 1.490 11/21/2017   Lab Results  Component Value Date   HGBA1C 5.4 09/15/2019   Lab Results  Component Value Date   WBC 5.0 10/01/2019   HGB 13.6 10/01/2019   HCT 42.2 10/01/2019   MCV 91 10/01/2019   PLT 152 10/01/2019   Lab Results  Component Value Date   ALT 22 09/15/2019   AST 21 09/15/2019   ALKPHOS 44 09/15/2019   BILITOT 0.6 09/17/2018  Last vitamin D Lab Results  Component Value Date   VD25OH 72.2 10/01/2019      Review of Systems  Constitutional:  Negative for appetite change, chills, diaphoresis, fatigue and unexpected weight change.  HENT:  Negative for hearing loss, tinnitus, trouble swallowing and voice change.   Eyes:  Negative for visual disturbance.  Respiratory:  Negative for choking, shortness of breath and wheezing.   Cardiovascular:  Negative for chest pain, palpitations and leg swelling.  Gastrointestinal:  Negative for abdominal pain, blood in stool, constipation and diarrhea.  Genitourinary:  Negative for difficulty urinating, dysuria and  frequency.  Musculoskeletal:  Positive for arthralgias (left hip pain). Negative for back pain and myalgias.  Skin:  Negative for color change and rash.  Neurological:  Negative for dizziness, syncope and headaches.  Hematological:  Negative for adenopathy.  Psychiatric/Behavioral:  Negative for dysphoric mood and sleep disturbance. The patient is not nervous/anxious.    Patient Active Problem List   Diagnosis Date Noted   OAB (overactive bladder) 11/17/2019   Ganglion cyst of wrist, left 10/01/2019   Pain of left hip joint 03/29/2018   Hyperlipidemia, mild 11/22/2017   Lumbar disc herniation    Neurologic gait disorder    Neuropathic pain    Benign essential HTN    Cauda equina syndrome (HCC) 06/07/2016   Prediabetes 08/19/2015   Vitamin D deficiency 07/14/2015   B12 deficiency 07/14/2015   Obesity (BMI 30-39.9) 10/05/2014   Lumbar disc disease with radiculopathy 10/05/2014    Allergies  Allergen Reactions   Hydrochlorothiazide Other (See Comments)    Severe cramping    Past Surgical History:  Procedure Laterality Date   LUMBAR LAMINECTOMY/DECOMPRESSION MICRODISCECTOMY Bilateral 01/21/2015   Procedure: LUMBAR THREE-FOUR, LUMBAR FOUR-FIVE LUMBAR LAMINECTOMY/DECOMPRESSION MICRODISCECTOMY ;  Surgeon: Tressie Stalker, MD;  Location: [facility];  Service: Neurosurgery;  Laterality: Bilateral;  L34 L45 laminectomies and foraminotomies   LUMBAR LAMINECTOMY/DECOMPRESSION MICRODISCECTOMY N/A 06/07/2016   Procedure: LUMBAR LAMINECTOMY/DECOMPRESSION MICRODISCECTOMY, LUMBAR FOUR - FIVE;  Surgeon: Tressie Stalker, MD;  Location: [facility];  Service: Neurosurgery;  Laterality: N/A;   none  [id number]   TONSILLECTOMY     as a child    WISDOM TOOTH EXTRACTION     /w bone graft - also readying for implants     Social History   Tobacco Use   Smoking status: Former    Pack years: 0.00    Types: Cigarettes    Quit date: 04/28/2002    Years since quitting: 18.4   Smokeless tobacco: Never   Substance Use Topics   Alcohol use: Yes    Alcohol/week: 0.0 standard drinks    Comment: beer q day    Drug use: No     Medication list has been reviewed and updated.  No outpatient medications have been marked as taking for the 10/04/20 encounter (Appointment) with [name], MD.    [facility] 2/9 Scores 03/10/2020 10/01/2019 06/11/2019 03/06/2019  PHQ - 2 Score 0 0 0 0  PHQ- 9 Score 0 0 0 0    GAD 7 : Generalized Anxiety Score 03/10/2020 10/01/2019  Nervous, Anxious, on Edge 0 0  Control/stop worrying 0 0  Worry too much - different things 0 0  Trouble relaxing 0 0  Restless 0 0  Easily annoyed or irritable 0 0  Afraid - awful might happen 0 0  Total GAD 7 Score 0 0  Anxiety Difficulty - Not difficult at all    BP Readings from Last 3 Encounters:  03/10/20 132/86  11/25/19 139/84  10/01/19 118/82    Physical Exam Vitals and nursing note reviewed.  Constitutional:      Appearance: Normal appearance. He is well-developed.  HENT:     Head: Normocephalic.     Right Ear: Tympanic membrane, ear canal and external ear normal.     Left Ear: Tympanic membrane, ear canal and external ear normal.     Nose: Nose normal.  Eyes:     Conjunctiva/sclera: Conjunctivae normal.     Pupils: Pupils are equal, round, and reactive to light.  Neck:     Thyroid: No thyromegaly.     Vascular: No carotid bruit.  Cardiovascular:     Rate and Rhythm: Normal rate and regular rhythm.     Heart sounds: Normal heart sounds.  Pulmonary:     Effort: Pulmonary effort is normal.     Breath sounds: Normal breath sounds. No wheezing.  Chest:  Breasts:    Right: No mass.     Left: No mass.  Abdominal:     General: Bowel sounds are normal.     Palpations: Abdomen is soft.     Tenderness: There is no abdominal tenderness.  Musculoskeletal:        General: Normal range of motion.     Cervical back: Normal range of motion and neck supple.  Lymphadenopathy:     Cervical: No cervical  adenopathy.  Skin:    General: Skin is warm and dry.  Neurological:     Mental Status: He is alert and oriented to person, place, and time.     Deep Tendon Reflexes: Reflexes are normal and symmetric.  Psychiatric:        Attention and Perception: Attention normal.        Mood and Affect: Mood normal.        Thought Content: Thought content normal.    Wt Readings from Last 3 Encounters:  03/10/20 238 lb (108 kg)  11/25/19 236 lb (107 kg)  10/01/19 246 lb (111.6 kg)  There were no vitals taken for this visit.  Assessment and Plan:

## 2020-11-02 ENCOUNTER — Other Ambulatory Visit: Payer: Self-pay | Admitting: Internal Medicine

## 2020-11-02 DIAGNOSIS — I10 Essential (primary) hypertension: Secondary | ICD-10-CM

## 2020-11-02 NOTE — Telephone Encounter (Signed)
Requested medication (s) are due for refill today: yes   Requested medication (s) are on the active medication list: yes   Last refill:  08/03/2020  Future visit scheduled: no  Notes to clinic:  overdue for follow up appt Message sent to patient to contact office   Requested Prescriptions  Pending Prescriptions Disp Refills   lisinopril (ZESTRIL) 20 MG tablet [Pharmacy Med Name: LISINOPRIL 20 MG TABLET] 90 tablet 1    Sig: TAKE 1 TABLET BY MOUTH EVERY DAY      Cardiovascular:  ACE Inhibitors Failed - 11/02/2020  8:44 AM      Failed - Cr in normal range and within 180 days    Creatinine  Date Value Ref Range Status  09/15/2019 1.0 0.6 - 1.3 Final   Creatinine, Ser  Date Value Ref Range Status  09/17/2018 0.98 0.76 - 1.27 mg/dL Final          Failed - K in normal range and within 180 days    Potassium  Date Value Ref Range Status  09/17/2018 4.8 3.5 - 5.2 mmol/L Final          Failed - Valid encounter within last 6 months    Recent Outpatient Visits           7 months ago Benign essential HTN   [facility] [name], MD   1 year ago Annual physical exam   [facility] [name], MD   1 year ago Tinea   [facility] [name], MD   1 year ago Vitamin D deficiency   [facility] [name], MD   2 years ago Annual physical exam   [facility] [name], MD                Passed - Patient is not pregnant      Passed - Last BP in normal range    BP Readings from Last 1 Encounters:  03/10/20 132/86

## 2020-11-02 NOTE — Telephone Encounter (Signed)
Pt needs an appt for HTN.  KP

## 2020-11-25 ENCOUNTER — Other Ambulatory Visit: Payer: Self-pay | Admitting: Internal Medicine

## 2020-11-25 DIAGNOSIS — I10 Essential (primary) hypertension: Secondary | ICD-10-CM

## 2020-11-25 NOTE — Telephone Encounter (Signed)
Requested medication (s) are due for refill today: yes  Requested medication (s) are on the active medication list: yes  Last refill:  11/02/20 #30 0 refills  Future visit scheduled: no   Notes to clinic:  no valid encounter with in 6 months. Do you want another courtesy refill? Called patient to schedule appt. No answer, LVMTCB. Last labs 09/15/19.      Requested Prescriptions  Pending Prescriptions Disp Refills   lisinopril (ZESTRIL) 20 MG tablet [Pharmacy Med Name: LISINOPRIL 20 MG TABLET] 30 tablet 0    Sig: TAKE 1 TABLET BY MOUTH EVERY DAY     Cardiovascular:  ACE Inhibitors Failed - 11/25/2020 11:31 AM      Failed - Cr in normal range and within 180 days    Creatinine  Date Value Ref Range Status  09/15/2019 1.0 0.6 - 1.3 Final   Creatinine, Ser  Date Value Ref Range Status  09/17/2018 0.98 0.76 - 1.27 mg/dL Final          Failed - K in normal range and within 180 days    Potassium  Date Value Ref Range Status  09/17/2018 4.8 3.5 - 5.2 mmol/L Final          Failed - Valid encounter within last 6 months    Recent Outpatient Visits           8 months ago Benign essential HTN   [facility] [name], MD   1 year ago Annual physical exam   [facility] [name], MD   1 year ago Tinea   [facility] [name], MD   1 year ago Vitamin D deficiency   [facility] [name], MD   2 years ago Annual physical exam   [facility] [name], MD              Passed - Patient is not pregnant      Passed - Last BP in normal range    BP Readings from Last 1 Encounters:  03/10/20 132/86

## 2020-12-06 ENCOUNTER — Telehealth: Payer: Self-pay | Admitting: Internal Medicine

## 2020-12-06 ENCOUNTER — Other Ambulatory Visit: Payer: Self-pay | Admitting: Internal Medicine

## 2020-12-06 DIAGNOSIS — I10 Essential (primary) hypertension: Secondary | ICD-10-CM

## 2020-12-06 MED ORDER — LISINOPRIL 20 MG PO TABS: 20.0000 mg | ORAL_TABLET | Freq: Every day | ORAL | 0 refills | Status: DC

## 2020-12-06 NOTE — Telephone Encounter (Signed)
Medication Refill - Medication:  lisinopril (ZESTRIL) 20 MG tablet   *Pt requested if he could get a refill to hold him until his appt on 09/22.please advise.*  Has the patient contacted their pharmacy? No.  Preferred Pharmacy (with phone number or street name):  [id number] [address]  Phone:  [phone] Fax:  [phone]  Agent: Please be advised that RX refills may take up to 3 business days. We ask that you follow-up with your pharmacy.

## 2020-12-16 ENCOUNTER — Encounter: Payer: Self-pay | Admitting: Internal Medicine

## 2020-12-16 ENCOUNTER — Ambulatory Visit: Payer: BC Managed Care – PPO | Admitting: Internal Medicine

## 2020-12-16 ENCOUNTER — Other Ambulatory Visit: Payer: Self-pay

## 2020-12-16 VITALS — BP 136/78 | HR 56 | Ht 73.0 in | Wt 248.0 lb

## 2020-12-16 DIAGNOSIS — Z125 Encounter for screening for malignant neoplasm of prostate: Secondary | ICD-10-CM | POA: Diagnosis not present

## 2020-12-16 DIAGNOSIS — E559 Vitamin D deficiency, unspecified: Secondary | ICD-10-CM | POA: Diagnosis not present

## 2020-12-16 DIAGNOSIS — G834 Cauda equina syndrome: Secondary | ICD-10-CM

## 2020-12-16 DIAGNOSIS — I10 Essential (primary) hypertension: Secondary | ICD-10-CM | POA: Diagnosis not present

## 2020-12-16 DIAGNOSIS — M5126 Other intervertebral disc displacement, lumbar region: Secondary | ICD-10-CM

## 2020-12-16 DIAGNOSIS — M25552 Pain in left hip: Secondary | ICD-10-CM

## 2020-12-16 DIAGNOSIS — Z1211 Encounter for screening for malignant neoplasm of colon: Secondary | ICD-10-CM

## 2020-12-16 MED ORDER — LISINOPRIL 20 MG PO TABS: 20.0000 mg | ORAL_TABLET | Freq: Every day | ORAL | 1 refills | Status: DC

## 2020-12-16 MED ORDER — TADALAFIL 5 MG PO TABS: 5.0000 mg | ORAL_TABLET | Freq: Every day | ORAL | 1 refills | Status: DC | PRN

## 2020-12-16 MED ORDER — VITAMIN D3 1.25 MG (50000 UT) PO CAPS: ORAL_CAPSULE | ORAL | 3 refills | Status: DC

## 2020-12-16 MED ORDER — MELOXICAM 15 MG PO TABS: 15.0000 mg | ORAL_TABLET | Freq: Every day | ORAL | 1 refills | Status: DC

## 2020-12-16 MED ORDER — GABAPENTIN 300 MG PO CAPS: 300.0000 mg | ORAL_CAPSULE | Freq: Two times a day (BID) | ORAL | 3 refills | Status: DC

## 2020-12-16 NOTE — Progress Notes (Signed)
Date:  12/16/2020   Name:  Andrew [name]   DOB:  23-Aug-1970   MRN:  [id number]   Chief Complaint: Hypertension  Hypertension This is a chronic problem. The problem is controlled (at home controlled). Pertinent negatives include no chest pain, headaches or shortness of breath. Past treatments include ACE inhibitors. The current treatment provides significant improvement. There are no compliance problems.  There is no history of kidney disease, CAD/MI or CVA.  Hip Pain  The pain is present in the left hip. The quality of the pain is described as aching. The pain is mild. The pain has been Intermittent since onset. Associated symptoms include numbness. He has tried NSAIDs for the symptoms.  Back Pain This is a chronic problem. The problem is unchanged. The quality of the pain is described as burning. The pain is mild. Associated symptoms include numbness. Pertinent negatives include no bladder incontinence, bowel incontinence, chest pain, fever or headaches. Risk factors: hx of cauda equina syndrome. Treatments tried: gabapentin.  Erectile Dysfunction This is a recurrent problem. The nature of his difficulty is maintaining erection. Non-physiologic factors contributing to erectile dysfunction are anxiety (more stress at work recently). Irritative symptoms do not include frequency or urgency. Pertinent negatives include no chills. Past treatments include sildenafil. The treatment provided significant relief.   Lab Results  Component Value Date   CREATININE 1.0 09/15/2019   BUN 22 (A) 09/15/2019   NA 141 09/17/2018   K 4.8 09/17/2018   CL 102 09/17/2018   CO2 26 09/17/2018   Lab Results  Component Value Date   CHOL 182 09/15/2019   HDL 49 09/15/2019   LDLCALC 111 09/15/2019   TRIG 108 09/15/2019   CHOLHDL 4.3 09/17/2018   Lab Results  Component Value Date   TSH 1.490 11/21/2017   Lab Results  Component Value Date   HGBA1C 5.4 09/15/2019   Lab Results  Component Value Date   WBC  5.0 10/01/2019   HGB 13.6 10/01/2019   HCT 42.2 10/01/2019   MCV 91 10/01/2019   PLT 152 10/01/2019   Lab Results  Component Value Date   ALT 22 09/15/2019   AST 21 09/15/2019   ALKPHOS 44 09/15/2019   BILITOT 0.6 09/17/2018     Review of Systems  Constitutional:  Negative for chills, fatigue and fever.  Respiratory:  Negative for cough, chest tightness and shortness of breath.   Cardiovascular:  Negative for chest pain and leg swelling.  Gastrointestinal:  Negative for blood in stool, bowel incontinence and constipation.  Genitourinary:  Negative for bladder incontinence, difficulty urinating, frequency and urgency.  Musculoskeletal:  Positive for back pain.  Neurological:  Positive for numbness. Negative for dizziness and headaches.  Psychiatric/Behavioral:  Negative for dysphoric mood. The patient is nervous/anxious (more stress at work recently).    Patient Active Problem List   Diagnosis Date Noted   OAB (overactive bladder) 11/17/2019   Ganglion cyst of wrist, left 10/01/2019   Pain of left hip joint 03/29/2018   Hyperlipidemia, mild 11/22/2017   Lumbar disc herniation    Neurologic gait disorder    Neuropathic pain    Benign essential HTN    Cauda equina syndrome (HCC) 06/07/2016   Prediabetes 08/19/2015   Vitamin D deficiency 07/14/2015   B12 deficiency 07/14/2015   Obesity (BMI 30-39.9) 10/05/2014   Lumbar disc disease with radiculopathy 10/05/2014    Allergies  Allergen Reactions   Hydrochlorothiazide Other (See Comments)    Severe cramping  Past Surgical History:  Procedure Laterality Date   LUMBAR LAMINECTOMY/DECOMPRESSION MICRODISCECTOMY Bilateral 01/21/2015   Procedure: LUMBAR THREE-FOUR, LUMBAR FOUR-FIVE LUMBAR LAMINECTOMY/DECOMPRESSION MICRODISCECTOMY ;  Surgeon: Tressie Stalker, MD;  Location: [facility];  Service: Neurosurgery;  Laterality: Bilateral;  L34 L45 laminectomies and foraminotomies   LUMBAR LAMINECTOMY/DECOMPRESSION MICRODISCECTOMY  N/A 06/07/2016   Procedure: LUMBAR LAMINECTOMY/DECOMPRESSION MICRODISCECTOMY, LUMBAR FOUR - FIVE;  Surgeon: Tressie Stalker, MD;  Location: [facility] OR;  Service: Neurosurgery;  Laterality: N/A;   none  [phone]   TONSILLECTOMY     as a child    WISDOM TOOTH EXTRACTION     /w bone graft - also readying for implants     Social History   Tobacco Use   Smoking status: Former    Types: Cigarettes    Quit date: 04/28/2002    Years since quitting: 18.6   Smokeless tobacco: Never  Substance Use Topics   Alcohol use: Yes    Alcohol/week: 0.0 standard drinks    Comment: beer q day    Drug use: No     Medication list has been reviewed and updated.  Current Meds  Medication Sig   Cholecalciferol (VITAMIN D3) 1.25 MG (50000 UT) CAPS TAKE 1 CAPSULE BY MOUTH ONE TIME PER WEEK   gabapentin (NEURONTIN) 300 MG capsule TAKE 1 CAPSULE BY MOUTH TWICE DAILY   lisinopril (ZESTRIL) 20 MG tablet Take 1 tablet (20 mg total) by mouth daily.   meloxicam (MOBIC) 15 MG tablet Take 15 mg by mouth daily at 6 (six) AM.   Multiple Vitamins-Minerals (MULTIVITAMIN ADULT PO) Take 1 tablet by mouth daily.    valACYclovir (VALTREX) 1000 MG tablet Take 1 tablet (1,000 mg total) by mouth 3 (three) times daily.    PHQ 2/9 Scores 12/16/2020 03/10/2020 10/01/2019 06/11/2019  PHQ - 2 Score 1 0 0 0  PHQ- 9 Score 1 0 0 0    GAD 7 : Generalized Anxiety Score 12/16/2020 03/10/2020 10/01/2019  Nervous, Anxious, on Edge 1 0 0  Control/stop worrying 0 0 0  Worry too much - different things 1 0 0  Trouble relaxing 1 0 0  Restless 0 0 0  Easily annoyed or irritable 1 0 0  Afraid - awful might happen 0 0 0  Total GAD 7 Score 4 0 0  Anxiety Difficulty Not difficult at all - Not difficult at all    BP Readings from Last 3 Encounters:  12/16/20 136/78  03/10/20 132/86  11/25/19 139/84    Physical Exam Vitals and nursing note reviewed.  Constitutional:      General: He is not in acute distress.    Appearance: Normal  appearance. He is well-developed.  HENT:     Head: Normocephalic and atraumatic.  Cardiovascular:     Rate and Rhythm: Normal rate and regular rhythm.     Pulses: Normal pulses.     Heart sounds: No murmur heard. Pulmonary:     Effort: Pulmonary effort is normal. No respiratory distress.     Breath sounds: No wheezing or rhonchi.  Musculoskeletal:     Cervical back: Normal range of motion.     Right lower leg: No edema.     Left lower leg: No edema.  Lymphadenopathy:     Cervical: No cervical adenopathy.  Skin:    General: Skin is warm and dry.     Findings: No rash.  Neurological:     General: No focal deficit present.     Mental Status: He is alert  and oriented to person, place, and time.     Gait: Gait abnormal (mildly antalgic).  Psychiatric:        Mood and Affect: Mood normal.        Behavior: Behavior normal.    Wt Readings from Last 3 Encounters:  12/16/20 248 lb (112.5 kg)  03/10/20 238 lb (108 kg)  11/25/19 236 lb (107 kg)    BP 136/78   Pulse (!) 56   Ht 6\' 1"  (1.854 m)   Wt 248 lb (112.5 kg)   SpO2 98%   BMI 32.72 kg/m   Assessment and Plan: 1. Benign essential HTN Clinically stable exam with well controlled BP. Tolerating medications without side effects at this time. Pt to continue current regimen and low sodium diet; benefits of regular exercise as able discussed.  - lisinopril (ZESTRIL) 20 MG tablet; Take 1 tablet (20 mg total) by mouth daily.  Dispense: 90 tablet; Refill: 1 - CBC with Differential/Platelet - Comprehensive metabolic panel - Lipid panel  2. Vitamin D deficiency - Cholecalciferol (VITAMIN D3) 1.25 MG (50000 UT) CAPS; TAKE 1 CAPSULE BY MOUTH ONE TIME PER WEEK  Dispense: 12 capsule; Refill: 3  3. Lumbar disc herniation Mild stable symptoms without change since having cauda equina syndrome - gabapentin (NEURONTIN) 300 MG capsule; Take 1 capsule (300 mg total) by mouth 2 (two) times daily.  Dispense: 180 capsule; Refill: 3  4.  Cauda equina syndrome (HCC) Incontinence resolved  5. Pain of left hip joint intermittent - meloxicam (MOBIC) 15 MG tablet; Take 1 tablet (15 mg total) by mouth daily at 6 (six) AM.  Dispense: 90 tablet; Refill: 1  6. Colon cancer screening - Ambulatory referral to Gastroenterology  7. Prostate cancer screening DRE deferred - PSA   Partially dictated using Dragon software. Any errors are unintentional.  , MD [facility]  12/16/2020

## 2020-12-17 ENCOUNTER — Other Ambulatory Visit: Payer: Self-pay

## 2020-12-17 DIAGNOSIS — Z1211 Encounter for screening for malignant neoplasm of colon: Secondary | ICD-10-CM

## 2020-12-17 LAB — COMPREHENSIVE METABOLIC PANEL
ALT: 29 IU/L (ref 0–44)
AST: 32 IU/L (ref 0–40)
Albumin/Globulin Ratio: 2.5 — ABNORMAL HIGH (ref 1.2–2.2)
Albumin: 4.9 g/dL (ref 4.0–5.0)
Alkaline Phosphatase: 56 IU/L (ref 44–121)
BUN/Creatinine Ratio: 12 (ref 9–20)
BUN: 12 mg/dL (ref 6–24)
Bilirubin Total: 0.5 mg/dL (ref 0.0–1.2)
CO2: 25 mmol/L (ref 20–29)
Calcium: 10.1 mg/dL (ref 8.7–10.2)
Chloride: 100 mmol/L (ref 96–106)
Creatinine, Ser: 0.97 mg/dL (ref 0.76–1.27)
Globulin, Total: 2 g/dL (ref 1.5–4.5)
Glucose: 98 mg/dL (ref 65–99)
Potassium: 5.2 mmol/L (ref 3.5–5.2)
Sodium: 140 mmol/L (ref 134–144)
Total Protein: 6.9 g/dL (ref 6.0–8.5)
eGFR: 96 mL/min/{1.73_m2} (ref >59)

## 2020-12-17 LAB — CBC WITH DIFFERENTIAL/PLATELET
Basophils Absolute: 0 10*3/uL (ref 0.0–0.2)
Basos: 1 %
EOS (ABSOLUTE): 0.2 10*3/uL (ref 0.0–0.4)
Eos: 4 %
Hematocrit: 44.5 % (ref 37.5–51.0)
Hemoglobin: 14.5 g/dL (ref 13.0–17.7)
Immature Grans (Abs): 0 10*3/uL (ref 0.0–0.1)
Immature Granulocytes: 0 %
Lymphocytes Absolute: 2.3 10*3/uL (ref 0.7–3.1)
Lymphs: 34 %
MCH: 30.1 pg (ref 26.6–33.0)
MCHC: 32.6 g/dL (ref 31.5–35.7)
MCV: 92 fL (ref 79–97)
Monocytes Absolute: 0.6 10*3/uL (ref 0.1–0.9)
Monocytes: 8 %
Neutrophils Absolute: 3.6 10*3/uL (ref 1.4–7.0)
Neutrophils: 53 %
Platelets: 252 10*3/uL (ref 150–450)
RBC: 4.82 x10E6/uL (ref 4.14–5.80)
RDW: 13 % (ref 11.6–15.4)
WBC: 6.8 10*3/uL (ref 3.4–10.8)

## 2020-12-17 LAB — PSA: Prostate Specific Ag, Serum: 0.3 ng/mL (ref 0.0–4.0)

## 2020-12-17 LAB — LIPID PANEL
Chol/HDL Ratio: 4.7 ratio (ref 0.0–5.0)
Cholesterol, Total: 204 mg/dL — ABNORMAL HIGH (ref 100–199)
HDL: 43 mg/dL (ref >39)
LDL Chol Calc (NIH): 124 mg/dL — ABNORMAL HIGH (ref 0–99)
Triglycerides: 211 mg/dL — ABNORMAL HIGH (ref 0–149)
VLDL Cholesterol Cal: 37 mg/dL (ref 5–40)

## 2020-12-17 MED ORDER — PEG 3350-KCL-NA BICARB-NACL 420 G PO SOLR: 4000.0000 mL | Freq: Once | ORAL | 0 refills | Status: AC

## 2020-12-17 NOTE — Progress Notes (Signed)
Gastroenterology Pre-Procedure Review  Request Date: 01/21/21 Requesting Physician: Dr. Maximino Greenland  PATIENT REVIEW QUESTIONS: The patient responded to the following health history questions as indicated:    1. Are you having any GI issues? no 2. Do you have a personal history of Polyps? no 3. Do you have a family history of Colon Cancer or Polyps? no 4. Diabetes Mellitus? no 5. Joint replacements in the past 12 months?no 6. Major health problems in the past 3 months?no 7. Any artificial heart valves, MVP, or defibrillator?no    MEDICATIONS & ALLERGIES:    Patient reports the following regarding taking any anticoagulation/antiplatelet therapy:   Plavix, Coumadin, Eliquis, Xarelto, Lovenox, Pradaxa, Brilinta, or Effient? no Aspirin? no  Patient confirms/reports the following medications:  Current Outpatient Medications  Medication Sig Dispense Refill   Cholecalciferol (VITAMIN D3) 1.25 MG (50000 UT) CAPS TAKE 1 CAPSULE BY MOUTH ONE TIME PER WEEK 12 capsule 3   gabapentin (NEURONTIN) 300 MG capsule Take 1 capsule (300 mg total) by mouth 2 (two) times daily. 180 capsule 3   lisinopril (ZESTRIL) 20 MG tablet Take 1 tablet (20 mg total) by mouth daily. 90 tablet 1   meloxicam (MOBIC) 15 MG tablet Take 1 tablet (15 mg total) by mouth daily at 6 (six) AM. 90 tablet 1   Multiple Vitamins-Minerals (MULTIVITAMIN ADULT PO) Take 1 tablet by mouth daily.      tadalafil (CIALIS) 5 MG tablet Take 1 tablet (5 mg total) by mouth daily as needed for erectile dysfunction. 10 tablet 1   valACYclovir (VALTREX) 1000 MG tablet Take 1 tablet (1,000 mg total) by mouth 3 (three) times daily. 30 tablet 0   No current facility-administered medications for this visit.    Patient confirms/reports the following allergies:  Allergies  Allergen Reactions   Hydrochlorothiazide Other (See Comments)    Severe cramping    No orders of the defined types were placed in this encounter.   AUTHORIZATION  INFORMATION Primary Insurance: 1D#: Group #:  Secondary Insurance: 1D#: Group #:  SCHEDULE INFORMATION: Date: 01/21/21 Time: Location: [facility]

## 2021-01-21 ENCOUNTER — Ambulatory Visit: Payer: BC Managed Care – PPO | Admitting: Anesthesiology

## 2021-01-21 ENCOUNTER — Encounter
Admission: RE | Disposition: A | Discharge status: Home / Self Care | Payer: Self-pay | Source: Home / Self Care | Attending: Gastroenterology

## 2021-01-21 ENCOUNTER — Ambulatory Visit
Admission: RE | Admit: 2021-01-21 | Discharge: 2021-01-21 | Disposition: A | Discharge status: Home / Self Care | Payer: BC Managed Care – PPO | Attending: Gastroenterology | Admitting: Gastroenterology

## 2021-01-21 DIAGNOSIS — Z1211 Encounter for screening for malignant neoplasm of colon: Secondary | ICD-10-CM

## 2021-01-21 DIAGNOSIS — Z87891 Personal history of nicotine dependence: Secondary | ICD-10-CM | POA: Insufficient documentation

## 2021-01-21 DIAGNOSIS — K649 Unspecified hemorrhoids: Secondary | ICD-10-CM | POA: Diagnosis not present

## 2021-01-21 DIAGNOSIS — E669 Obesity, unspecified: Secondary | ICD-10-CM | POA: Diagnosis not present

## 2021-01-21 DIAGNOSIS — K648 Other hemorrhoids: Secondary | ICD-10-CM | POA: Diagnosis not present

## 2021-01-21 DIAGNOSIS — Z6832 Body mass index (BMI) 32.0-32.9, adult: Secondary | ICD-10-CM | POA: Insufficient documentation

## 2021-01-21 HISTORY — PX: COLONOSCOPY WITH PROPOFOL: SHX5780

## 2021-01-21 SURGERY — COLONOSCOPY WITH PROPOFOL
Anesthesia: General

## 2021-01-21 MED ORDER — PROPOFOL 500 MG/50ML IV EMUL
INTRAVENOUS | Status: DC | PRN
  Administered 2021-01-21: 150 ug/kg/min via INTRAVENOUS

## 2021-01-21 MED ORDER — PROPOFOL 10 MG/ML IV BOLUS
INTRAVENOUS | Status: DC | PRN
Start: 2021-01-21 — End: 2021-01-21
  Administered 2021-01-21: 60 mg via INTRAVENOUS

## 2021-01-21 MED ORDER — GLYCOPYRROLATE 0.2 MG/ML IJ SOLN
INTRAMUSCULAR | Status: DC | PRN
  Administered 2021-01-21: .2 mg via INTRAVENOUS

## 2021-01-21 MED ORDER — GLYCOPYRROLATE 0.2 MG/ML IJ SOLN
INTRAMUSCULAR | Status: AC
  Filled 2021-01-21: qty 1

## 2021-01-21 MED ORDER — SODIUM CHLORIDE 0.9 % IV SOLN
INTRAVENOUS | Status: DC
  Administered 2021-01-21 (×2): 1000 mL via INTRAVENOUS

## 2021-01-21 MED ORDER — LIDOCAINE HCL (CARDIAC) PF 100 MG/5ML IV SOSY
PREFILLED_SYRINGE | INTRAVENOUS | Status: DC | PRN
  Administered 2021-01-21: 50 mg via INTRAVENOUS

## 2021-01-21 MED ORDER — PROPOFOL 500 MG/50ML IV EMUL
INTRAVENOUS | Status: AC
  Filled 2021-01-21: qty 50

## 2021-01-21 NOTE — Anesthesia Preprocedure Evaluation (Signed)
Anesthesia Evaluation  Patient identified by MRN, date of birth, ID band Patient awake    Reviewed: Allergy & Precautions, NPO status , Patient's Chart, lab work & pertinent test results  History of Anesthesia Complications Negative for: history of anesthetic complications  Airway Mallampati: I   Neck ROM: Full    Dental   Missing lower right molar:   Pulmonary former smoker (quit 2004),    Pulmonary exam normal breath sounds clear to auscultation       Cardiovascular Exercise Tolerance: Good hypertension, Normal cardiovascular exam Rhythm:Regular Rate:Normal     Neuro/Psych  Neuromuscular disease (cauda equina syndrome)    GI/Hepatic negative GI ROS,   Endo/Other  Obesity   Renal/GU negative Renal ROS     Musculoskeletal  (+) Arthritis ,   Abdominal   Peds  Hematology negative hematology ROS (+)   Anesthesia Other Findings   Reproductive/Obstetrics                             Anesthesia Physical Anesthesia Plan  ASA: 2  Anesthesia Plan: General   Post-op Pain Management:    Induction: Intravenous  PONV Risk Score and Plan: 2 and Propofol infusion, TIVA and Treatment may vary due to age or medical condition  Airway Management Planned: Natural Airway  Additional Equipment:   Intra-op Plan:   Post-operative Plan:   Informed Consent: I have reviewed the patients History and Physical, chart, labs and discussed the procedure including the risks, benefits and alternatives for the proposed anesthesia with the patient or authorized representative who has indicated his/her understanding and acceptance.       Plan Discussed with: CRNA  Anesthesia Plan Comments:         Anesthesia Quick Evaluation

## 2021-01-21 NOTE — Transfer of Care (Signed)
Immediate Anesthesia Transfer of Care Note  Patient: Andrew [name]  Procedure(s) Performed: COLONOSCOPY WITH PROPOFOL  Patient Location: PACU and Endoscopy Unit  Anesthesia Type:General  Level of Consciousness: awake, drowsy and patient cooperative  Airway & Oxygen Therapy: Patient Spontanous Breathing  Post-op Assessment: Report given to RN and Post -op Vital signs reviewed and stable  Post vital signs: Reviewed and stable  Last Vitals:  Vitals Value Taken Time  BP 106/70 01/21/2021  Temp    Pulse 70   Resp 14   SpO2 100     Last Pain:  Vitals:   01/21/21 0936  TempSrc: Temporal  PainSc: 0-No pain         Complications: No notable events documented.

## 2021-01-21 NOTE — Op Note (Signed)
[facility] G[facility]ology Patient Name: Jilberto Vanderwall Procedure Date: 01/21/2021 10:11 AM MRN: [id number] Account #: [id number] Date of Birth: 25-Feb-1971 Admit Type: Outpatient Age: 50 Room: [facility] ENDO ROOM 2 Gender: Male Note Status: Finalized Instrument Name: Nelda Marseille [id number] Procedure:             Colonoscopy Indications:           Screening for colorectal malignant neoplasm Providers:             Kaelani Kendrick B. Maximino Greenland MD, MD Medicines:             Monitored Anesthesia Care Complications:         No immediate complications. Procedure:             Pre-Anesthesia Assessment:                        - Prior to the procedure, a History and Physical was                         performed, and patient medications, allergies and                         sensitivities were reviewed. The patient's tolerance                         of previous anesthesia was reviewed.                        - The risks and benefits of the procedure and the                         sedation options and risks were discussed with the                         patient. All questions were answered and informed                         consent was obtained.                        - Patient identification and proposed procedure were                         verified prior to the procedure by the physician, the                         nurse, the anesthetist and the technician. The                         procedure was verified in the pre-procedure area in                         the procedure room in the endoscopy suite.                        - ASA Grade Assessment: II - A patient with mild                         systemic disease.                        -  After reviewing the risks and benefits, the patient                         was deemed in satisfactory condition to undergo the                         procedure.                        After obtaining informed consent, the colonoscope was                          passed under direct vision. Throughout the procedure,                         the patient's blood pressure, pulse, and oxygen                         saturations were monitored continuously. The                         Colonoscope was introduced through the anus and                         advanced to the the cecum, identified by appendiceal                         orifice and ileocecal valve. The colonoscopy was                         performed with ease. The patient tolerated the                         procedure well. The quality of the bowel preparation                         was fair. Water and suction used to clean the colon. Findings:      The perianal and digital rectal examinations were normal.      The rectum, sigmoid colon, descending colon, transverse colon, ascending       colon and cecum appeared normal.      Non-bleeding internal hemorrhoids were found during retroflexion. Impression:            - Preparation of the colon was fair.                        - The rectum, sigmoid colon, descending colon,                         transverse colon, ascending colon and cecum are normal.                        - Non-bleeding internal hemorrhoids.                        - No specimens collected. Recommendation:        - Discharge patient to home.                        -  Resume previous diet.                        - Continue present medications.                        - Repeat colonoscopy in 3 years, with 2 day prep,                         because the bowel preparation was suboptimal (Since                         the colon was partially cleaned with water and suction                         repeat exam in 3 years with extended prep is                         reasonable).                        - Return to primary care physician as previously                         scheduled.                        - The findings and recommendations were discussed with                          the patient.                        - The findings and recommendations were discussed with                         the patient's family. Procedure Code(s):     --- Professional ---                        [phone], Colonoscopy, flexible; diagnostic, including                         collection of specimen(s) by brushing or washing, when                         performed (separate procedure) Diagnosis Code(s):     --- Professional ---                        Z12.11, Encounter for screening for malignant neoplasm                         of colon CPT copyright 2019 [employer]. All rights reserved. The codes documented in this report are preliminary and upon coder review may  be revised to meet current compliance requirements.  Melodie Bouillon, MD Michel Bickers B. Maximino Greenland MD, MD 01/21/2021 10:50:38 AM This report has been signed electronically. Number of Addenda: 0 Note Initiated On: 01/21/2021 10:11 AM Scope Withdrawal Time: 0 hours 10 minutes 40 seconds  Total Procedure Duration: 0 hours 14 minutes 36 seconds  [facility]

## 2021-01-21 NOTE — H&P (Signed)
Melodie Bouillon, MD [address], [place], [place], [address] [address], [address], [place], [place], [address] Phone: [phone]  Fax: [phone]  Primary Care Physician:  [name], MD   Pre-Procedure History & Physical: HPI:  Andrew [name] is a 50 y.o. male is here for a colonoscopy.   Past Medical History:  Diagnosis Date   Arthritis    Hypertension    Neurogenic bladder    Neurogenic bowel    Neuromuscular disorder (HCC)    lumbar spinal stenosis      Past Surgical History:  Procedure Laterality Date   LUMBAR LAMINECTOMY/DECOMPRESSION MICRODISCECTOMY Bilateral 01/21/2015   Procedure: LUMBAR THREE-FOUR, LUMBAR FOUR-FIVE LUMBAR LAMINECTOMY/DECOMPRESSION MICRODISCECTOMY ;  Surgeon: Tressie Stalker, MD;  Location: [facility];  Service: Neurosurgery;  Laterality: Bilateral;  L34 L45 laminectomies and foraminotomies   LUMBAR LAMINECTOMY/DECOMPRESSION MICRODISCECTOMY N/A 06/07/2016   Procedure: LUMBAR LAMINECTOMY/DECOMPRESSION MICRODISCECTOMY, LUMBAR FOUR - FIVE;  Surgeon: Tressie Stalker, MD;  Location: [facility];  Service: Neurosurgery;  Laterality: N/A;   none  [id number]   TONSILLECTOMY     as a child    WISDOM TOOTH EXTRACTION     /w bone graft - also readying for implants     Prior to Admission medications   Medication Sig Start Date End Date Taking? Authorizing Provider  Cholecalciferol (VITAMIN D3) 1.25 MG (50000 UT) CAPS TAKE 1 CAPSULE BY MOUTH ONE TIME PER WEEK 12/16/20  Yes [name], MD  gabapentin (NEURONTIN) 300 MG capsule Take 1 capsule (300 mg total) by mouth 2 (two) times daily. 12/16/20  Yes [name], MD  lisinopril (ZESTRIL) 20 MG tablet Take 1 tablet (20 mg total) by mouth daily. 12/16/20  Yes [name], MD  meloxicam (MOBIC) 15 MG tablet Take 1 tablet (15 mg total) by mouth daily at 6 (six) AM. 12/16/20  Yes [name], MD  Multiple Vitamins-Minerals (MULTIVITAMIN ADULT PO) Take 1 tablet by mouth daily.     Yes [provider]  tadalafil (CIALIS) 5 MG tablet Take 1 tablet (5 mg total) by mouth daily as needed for erectile dysfunction. 12/16/20  Yes [name], MD  valACYclovir (VALTREX) 1000 MG tablet Take 1 tablet (1,000 mg total) by mouth 3 (three) times daily. 03/29/18  Yes [name], MD    Allergies as of 12/17/2020 - Review Complete 12/16/2020  Allergen Reaction Noted   Hydrochlorothiazide Other (See Comments) 01/15/2015    Family History  Problem Relation Age of Onset   Hypertension Mother    CAD Mother 40   Diabetes Mother    Lymphoma Brother    Liver disease Father        liver transplant for hep C    Social History   Socioeconomic History   Marital status: Married    Spouse name: Not on file   Number of children: Not on file   Years of education: Not on file   Highest education level: Not on file  Occupational History   Occupation: Production designer, theatre/television/film  Tobacco Use   Smoking status: Former    Types: Cigarettes    Quit date: 04/28/2002    Years since quitting: 18.7   Smokeless tobacco: Never  Substance and Sexual Activity   Alcohol use: Yes    Alcohol/week: 0.0 standard drinks    Comment: beer q day    Drug use: No   Sexual activity: Not on file  Other Topics Concern   Not on file  Social History  Narrative   Not on file   Social Determinants of Health   Financial Resource Strain: Not on file  Food Insecurity: Not on file  Transportation Needs: Not on file  Physical Activity: Not on file  Stress: Not on file  Social Connections: Not on file  Intimate Partner Violence: Not on file    Review of Systems: See HPI, otherwise negative ROS  Physical Exam: Constitutional: General:   Alert,  Well-developed, well-nourished, pleasant and cooperative in NAD BP 119/83   Pulse (!) 55   Temp (!) 97.4 F (36.3 C) (Temporal)   Resp 16   Ht 6\' 1"  (1.854 m)   Wt 113.4 kg   SpO2 100%   BMI 32.98 kg/m   Head: Normocephalic, atraumatic.   Eyes:  Sclera  clear, no icterus.   Conjunctiva pink.   Mouth:  No deformity or lesions, oropharynx pink & moist.  Neck:  Supple, trachea midline  Respiratory: Normal respiratory effort  Gastrointestinal:  Soft, non-tender and non-distended without masses, hepatosplenomegaly or hernias noted.  No guarding or rebound tenderness.     Cardiac: No clubbing or edema.  No cyanosis. Normal posterior tibial pedal pulses noted.  Lymphatic:  No significant cervical adenopathy.  Psych:  Alert and cooperative. Normal mood and affect.  Musculoskeletal:   Symmetrical without gross deformities. 5/5 Lower extremity strength bilaterally.  Skin: Warm. Intact without significant lesions or rashes. No jaundice.  Neurologic:  Face symmetrical, tongue midline, Normal sensation to touch;  grossly normal neurologically.  Psych:  Alert and oriented x3, Alert and cooperative. Normal mood and affect.  Impression/Plan: Andrew [name] is here for a colonoscopy to be performed for average risk screening.  Risks, benefits, limitations, and alternatives regarding  colonoscopy have been reviewed with the patient.  Questions have been answered.  All parties agreeable.   [name], MD  01/21/2021, 10:24 AM

## 2021-01-21 NOTE — OR Nursing (Addendum)
Pt's heart rate dropped to the thirties anesthia adm robinul. Hears rate at present 57.

## 2021-01-21 NOTE — Anesthesia Procedure Notes (Signed)
Procedure Name: MAC Date/Time: 01/21/2021 10:23 AM Performed by: Jerrye Noble, CRNA Pre-anesthesia Checklist: Patient identified, Emergency Drugs available, Suction available and Patient being monitored Patient Re-evaluated:Patient Re-evaluated prior to induction Oxygen Delivery Method: Nasal cannula

## 2021-01-21 NOTE — Anesthesia Postprocedure Evaluation (Signed)
Anesthesia Post Note  Patient: Andrew [name]  Procedure(s) Performed: COLONOSCOPY WITH PROPOFOL  Patient location during evaluation: PACU Anesthesia Type: General Level of consciousness: awake and alert, oriented and patient cooperative Pain management: pain level controlled Vital Signs Assessment: post-procedure vital signs reviewed and stable Respiratory status: spontaneous breathing, nonlabored ventilation and respiratory function stable Cardiovascular status: blood pressure returned to baseline, stable and bradycardic Postop Assessment: adequate PO intake Anesthetic complications: no Comments: Bradycardia both intra- and post-operatively, treated with glycopyrrolate.  Pt asymptomatic, and old ECG shows HR 60.  Pt ok to be discharged home.   No notable events documented.   Last Vitals:  Vitals:   01/21/21 1140 01/21/21 1150  BP: (!) 137/98 (!) 138/94  Pulse: (!) 58 (!) 54  Resp: 18 14  Temp:    SpO2: 100% 100%    Last Pain:  Vitals:   01/21/21 1050  TempSrc: Temporal  PainSc:                  [name]

## 2021-01-24 ENCOUNTER — Encounter: Payer: Self-pay | Admitting: Gastroenterology

## 2021-05-15 ENCOUNTER — Other Ambulatory Visit: Payer: Self-pay | Admitting: Internal Medicine

## 2021-05-15 DIAGNOSIS — B029 Zoster without complications: Secondary | ICD-10-CM

## 2021-05-15 MED ORDER — VALACYCLOVIR HCL 1 G PO TABS: 1000.0000 mg | ORAL_TABLET | Freq: Three times a day (TID) | ORAL | 0 refills | Status: AC

## 2021-05-30 LAB — BASIC METABOLIC PANEL
BUN: 14 (ref 4–21)
CO2: 24 — AB (ref 13–22)
Chloride: 105 (ref 99–108)
Creatinine: 0.9 (ref 0.6–1.3)
Glucose: 102
Potassium: 4.4 mEq/L (ref 3.5–5.1)
Sodium: 141 (ref 137–147)

## 2021-05-30 LAB — COMPREHENSIVE METABOLIC PANEL
Albumin: 4.6 (ref 3.5–5.0)
Calcium: 9.3 (ref 8.7–10.7)
Globulin: 2.3

## 2021-05-30 LAB — HEMOGLOBIN A1C: Hemoglobin A1C: 6

## 2021-05-30 LAB — LIPID PANEL
Cholesterol: 178 (ref 0–200)
HDL: 44 (ref 35–70)
LDL Cholesterol: 118
Triglycerides: 85 (ref 40–160)

## 2021-05-30 LAB — CBC AND DIFFERENTIAL
HCT: 42 (ref 41–53)
Hemoglobin: 13.9 (ref 13.5–17.5)
Platelets: 229 10*3/uL (ref 150–400)
WBC: 5.9

## 2021-05-30 LAB — HEPATIC FUNCTION PANEL
ALT: 28 U/L (ref 10–40)
AST: 32 (ref 14–40)
Alkaline Phosphatase: 56 (ref 25–125)
Bilirubin, Total: 0.2

## 2021-05-30 LAB — PSA: PSA: 0.3

## 2021-05-30 LAB — CBC: RBC: 4.71 (ref 3.87–5.11)

## 2021-06-08 ENCOUNTER — Other Ambulatory Visit: Payer: Self-pay | Admitting: Internal Medicine

## 2021-06-08 DIAGNOSIS — M25552 Pain in left hip: Secondary | ICD-10-CM

## 2021-06-08 NOTE — Telephone Encounter (Signed)
Requested Prescriptions  ?Pending Prescriptions Disp Refills  ?? meloxicam (MOBIC) 15 MG tablet [Pharmacy Med Name: MELOXICAM 15 MG TABLET] 90 tablet 0  ?  Sig: TAKE 1 TABLET (15 MG TOTAL) BY MOUTH DAILY AT 6 (SIX) AM.  ?  ? Analgesics:  COX2 Inhibitors Failed - 06/08/2021  2:00 AM  ?  ?  Failed - Manual Review: Labs are only required if the patient has taken medication for more than 8 weeks.  ?  ?  Passed - HGB in normal range and within 360 days  ?  Hemoglobin  ?Date Value Ref Range Status  ?05/30/2021 13.9 13.5 - 17.5 Final  ?12/16/2020 14.5 13.0 - 17.7 g/dL Final  ?   ?  ?  Passed - Cr in normal range and within 360 days  ?  Creatinine  ?Date Value Ref Range Status  ?05/30/2021 0.9 0.6 - 1.3 Final  ? ?Creatinine, Ser  ?Date Value Ref Range Status  ?12/16/2020 0.97 0.76 - 1.27 mg/dL Final  ?   ?  ?  Passed - HCT in normal range and within 360 days  ?  HCT  ?Date Value Ref Range Status  ?05/30/2021 42 41 - 53 Final  ? ?Hematocrit  ?Date Value Ref Range Status  ?12/16/2020 44.5 37.5 - 51.0 % Final  ?   ?  ?  Passed - AST in normal range and within 360 days  ?  AST  ?Date Value Ref Range Status  ?05/30/2021 32 14 - 40 Final  ?   ?  ?  Passed - ALT in normal range and within 360 days  ?  ALT  ?Date Value Ref Range Status  ?05/30/2021 28 10 - 40 U/L Final  ?   ?  ?  Passed - eGFR is 30 or above and within 360 days  ?  GFR calc Af Amer  ?Date Value Ref Range Status  ?09/17/2018 106 >59 mL/min/1.73 Final  ? ?GFR calc non Af Amer  ?Date Value Ref Range Status  ?09/17/2018 92 >59 mL/min/1.73 Final  ? ?eGFR  ?Date Value Ref Range Status  ?12/16/2020 96 >59 mL/min/1.73 Final  ?   ?  ?  Passed - Patient is not pregnant  ?  ?  Passed - Valid encounter within last 12 months  ?  Recent Outpatient Visits   ?      ? 5 months ago Benign essential HTN  ? [facility] Berglund, Laura H, MD  ? 1 year ago Benign essential HTN  ? [facility] Berglund, Laura H, MD  ? 1 year ago Annual physical exam  ? [facility] Berglund, Laura H, MD  ? 1 year ago Tinea  ? [facility] Berglund, Laura H, MD  ? 2 years ago Vitamin D deficiency  ? [facility] Berglund, Laura H, MD  ?  ?  ?Future Appointments   ?        ? In 1 week Berglund, Laura H, MD [facility]  ? In 6 months Berglund, Laura H, MD [facility]  ?  ? ?  ?  ?  ? ?

## 2021-06-17 ENCOUNTER — Other Ambulatory Visit: Payer: Self-pay

## 2021-06-17 ENCOUNTER — Encounter: Payer: Self-pay | Admitting: Internal Medicine

## 2021-06-17 ENCOUNTER — Ambulatory Visit: Payer: BC Managed Care – PPO | Admitting: Internal Medicine

## 2021-06-17 VITALS — BP 136/85 | HR 74 | Ht 73.0 in | Wt 261.0 lb

## 2021-06-17 DIAGNOSIS — R7303 Prediabetes: Secondary | ICD-10-CM

## 2021-06-17 DIAGNOSIS — I10 Essential (primary) hypertension: Secondary | ICD-10-CM | POA: Diagnosis not present

## 2021-06-17 DIAGNOSIS — G834 Cauda equina syndrome: Secondary | ICD-10-CM

## 2021-06-17 MED ORDER — LISINOPRIL 20 MG PO TABS: 20.0000 mg | ORAL_TABLET | Freq: Every day | ORAL | 1 refills | Status: DC

## 2021-06-17 NOTE — Progress Notes (Signed)
? ? ?Date:  06/17/2021  ? ?Name:  Andrew [name]   DOB:  1971/01/24   MRN:  [id number] ? ? ?Chief Complaint: Hypertension ? ?Hypertension ?This is a chronic problem. The problem is controlled. Pertinent negatives include no chest pain, headaches, palpitations or shortness of breath. Past treatments include ACE inhibitors. The current treatment provides significant improvement.  ?Diabetes ?He presents for his follow-up diabetic visit. Diabetes type: prediabetes. His disease course has been stable (recent A1C 6.0). Pertinent negatives for hypoglycemia include no dizziness or headaches. Pertinent negatives for diabetes include no chest pain, no fatigue and no weakness.  ? ?Lab Results  ?Component Value Date  ? NA 141 05/30/2021  ? K 4.4 05/30/2021  ? CO2 24 (A) 05/30/2021  ? GLUCOSE 98 12/16/2020  ? BUN 14 05/30/2021  ? CREATININE 0.9 05/30/2021  ? CALCIUM 9.3 05/30/2021  ? EGFR 96 12/16/2020  ? GFRNONAA 92 09/17/2018  ? ?Lab Results  ?Component Value Date  ? CHOL 178 05/30/2021  ? HDL 44 05/30/2021  ? [place] 118 05/30/2021  ? TRIG 85 05/30/2021  ? CHOLHDL 4.7 12/16/2020  ? ?Lab Results  ?Component Value Date  ? TSH 1.490 11/21/2017  ? ?Lab Results  ?Component Value Date  ? HGBA1C 6.0 05/30/2021  ? ?Lab Results  ?Component Value Date  ? WBC 5.9 05/30/2021  ? HGB 13.9 05/30/2021  ? HCT 42 05/30/2021  ? MCV 92 12/16/2020  ? PLT 229 05/30/2021  ? ?Lab Results  ?Component Value Date  ? ALT 28 05/30/2021  ? AST 32 05/30/2021  ? ALKPHOS 56 05/30/2021  ? BILITOT 0.5 12/16/2020  ? ?Lab Results  ?Component Value Date  ? VD25OH 72.2 10/01/2019  ?  ? ?Review of Systems  ?Constitutional:  Negative for fatigue and unexpected weight change.  ?HENT:  Negative for nosebleeds.   ?Eyes:  Negative for visual disturbance.  ?Respiratory:  Negative for cough, chest tightness, shortness of breath and wheezing.   ?Cardiovascular:  Negative for chest pain, palpitations and leg swelling.  ?Gastrointestinal:  Negative for abdominal pain,  constipation and diarrhea.  ?Neurological:  Negative for dizziness, weakness, light-headedness and headaches.  ? ?Patient Active Problem List  ? Diagnosis Date Noted  ? Colon cancer screening   ? OAB (overactive bladder) 11/17/2019  ? Ganglion cyst of wrist, left 10/01/2019  ? Pain of left hip joint 03/29/2018  ? Hyperlipidemia, mild 11/22/2017  ? Lumbar disc herniation   ? Neurologic gait disorder   ? Neuropathic pain   ? Benign essential HTN   ? Cauda equina syndrome ([place]) 06/07/2016  ? Prediabetes 08/19/2015  ? Vitamin D deficiency 07/14/2015  ? B12 deficiency 07/14/2015  ? Obesity (BMI 30-39.9) 10/05/2014  ? Lumbar disc disease with radiculopathy 10/05/2014  ? ? ?Allergies  ?Allergen Reactions  ? Hydrochlorothiazide Other (See Comments)  ?  Severe cramping  ? ? ?Past Surgical History:  ?Procedure Laterality Date  ? COLONOSCOPY WITH PROPOFOL N/A 01/21/2021  ? Procedure: COLONOSCOPY WITH PROPOFOL;  Surgeon: Virgel Manifold, MD;  Location: [facility];  Service: Endoscopy;  Laterality: N/A;  ? LUMBAR LAMINECTOMY/DECOMPRESSION MICRODISCECTOMY Bilateral 01/21/2015  ? Procedure: LUMBAR THREE-FOUR, LUMBAR FOUR-FIVE LUMBAR LAMINECTOMY/DECOMPRESSION MICRODISCECTOMY ;  Surgeon: Newman Pies, MD;  Location: [facility];  Service: Neurosurgery;  Laterality: Bilateral;  L34 L45 laminectomies and foraminotomies  ? LUMBAR LAMINECTOMY/DECOMPRESSION MICRODISCECTOMY N/A 06/07/2016  ? Procedure: LUMBAR LAMINECTOMY/DECOMPRESSION MICRODISCECTOMY, LUMBAR FOUR - FIVE;  Surgeon: Newman Pies, MD;  Location: [employer];  Service: Neurosurgery;  Laterality: N/A;  ?  none  [id number]  ? TONSILLECTOMY    ? as a child   ? WISDOM TOOTH EXTRACTION    ? /w bone graft - also readying for implants   ? ? ?Social History  ? ?Tobacco Use  ? Smoking status: Former  ?  Types: Cigarettes  ?  Quit date: 04/28/2002  ?  Years since quitting: 19.1  ? Smokeless tobacco: Never  ?Substance Use Topics  ? Alcohol use: Yes  ?  Alcohol/week: 0.0 standard  drinks  ?  Comment: beer q day   ? Drug use: No  ? ? ? ?Medication list has been reviewed and updated. ? ?Current Meds  ?Medication Sig  ? Cholecalciferol (VITAMIN D3) 1.25 MG (50000 UT) CAPS TAKE 1 CAPSULE BY MOUTH ONE TIME PER WEEK  ? gabapentin (NEURONTIN) 300 MG capsule Take 1 capsule (300 mg total) by mouth 2 (two) times daily.  ? meloxicam (MOBIC) 15 MG tablet TAKE 1 TABLET (15 MG TOTAL) BY MOUTH DAILY AT 6 (SIX) AM. (Patient taking differently: Take 15 mg by mouth as needed.)  ? Multiple Vitamins-Minerals (MULTIVITAMIN ADULT PO) Take 1 tablet by mouth daily.   ? tadalafil (CIALIS) 5 MG tablet Take 1 tablet (5 mg total) by mouth daily as needed for erectile dysfunction.  ? valACYclovir (VALTREX) 1000 MG tablet Take 1 tablet (1,000 mg total) by mouth 3 (three) times daily.  ? [DISCONTINUED] lisinopril (ZESTRIL) 20 MG tablet Take 1 tablet (20 mg total) by mouth daily.  ? ? ? ?  06/17/2021  ?  9:52 AM 12/16/2020  ?  9:30 AM 03/10/2020  ?  2:47 PM 10/01/2019  ?  9:06 AM  ?PHQ 2/9 Scores  ?PHQ - 2 Score 0 1 0 0  ?PHQ- 9 Score 0 1 0 0  ? ? ? ?  06/17/2021  ?  9:53 AM 12/16/2020  ?  9:31 AM 03/10/2020  ?  2:47 PM 10/01/2019  ?  9:07 AM  ?GAD 7 : Generalized Anxiety Score  ?Nervous, Anxious, on Edge 0 1 0 0  ?Control/stop worrying 0 0 0 0  ?Worry too much - different things 0 1 0 0  ?Trouble relaxing 0 1 0 0  ?Restless 0 0 0 0  ?Easily annoyed or irritable 0 1 0 0  ?Afraid - awful might happen 0 0 0 0  ?Total GAD 7 Score 0 4 0 0  ?Anxiety Difficulty  Not difficult at all  Not difficult at all  ? ? ?BP Readings from Last 3 Encounters:  ?06/17/21 136/85  ?01/21/21 (!) 138/94  ?12/16/20 136/78  ? ? ?Physical Exam ?Vitals and nursing note reviewed.  ?Constitutional:   ?   General: He is not in acute distress. ?   Appearance: He is well-developed.  ?HENT:  ?   Head: Normocephalic and atraumatic.  ?Cardiovascular:  ?   Rate and Rhythm: Normal rate and regular rhythm.  ?   Pulses: Normal pulses.  ?   Heart sounds: No murmur  heard. ?Pulmonary:  ?   Effort: Pulmonary effort is normal. No respiratory distress.  ?   Breath sounds: No wheezing or rhonchi.  ?Musculoskeletal:  ?   Cervical back: Normal range of motion.  ?   Right lower leg: No edema.  ?   Left lower leg: No edema.  ?Lymphadenopathy:  ?   Cervical: No cervical adenopathy.  ?Skin: ?   General: Skin is warm and dry.  ?   Capillary Refill: Capillary refill takes less than  2 seconds.  ?   Findings: No rash.  ?Neurological:  ?   Mental Status: He is alert and oriented to person, place, and time. Mental status is at baseline.  ?Psychiatric:     ?   Mood and Affect: Mood normal.     ?   Behavior: Behavior normal.  ? ? ?Wt Readings from Last 3 Encounters:  ?06/17/21 261 lb (118.4 kg)  ?01/21/21 250 lb (113.4 kg)  ?12/16/20 248 lb (112.5 kg)  ? ? ?BP 136/85 (BP Location: Right Arm, Cuff Size: Large)   Pulse 74   Ht 6' 1" (1.854 m)   Wt 261 lb (118.4 kg)   SpO2 96%   BMI 34.43 kg/m?  ? ?Assessment and Plan: ?1. Benign essential HTN ?Clinically stable exam with well controlled BP. ?Tolerating medications without side effects at this time. ?Pt to continue current regimen and low sodium diet; benefits of regular exercise as able discussed. ?- lisinopril (ZESTRIL) 20 MG tablet; Take 1 tablet (20 mg total) by mouth daily.  Dispense: 90 tablet; Refill: 1 ? ?2. Prediabetes ?Stable A1C - continue diet changes without medications for now. ? ?3. Cauda equina syndrome ([place]) ?Stable symptoms  ?Continue gabapentin, mobic and Cialis as needed ? ? ? ?Partially dictated using Editor, commissioning. Any errors are unintentional. ? ?Halina Maidens, MD ?[facility] ?[facility] ? ?06/17/2021 ? ? ? ? ? ?

## 2021-09-13 ENCOUNTER — Other Ambulatory Visit: Payer: Self-pay | Admitting: Internal Medicine

## 2021-09-13 DIAGNOSIS — M25552 Pain in left hip: Secondary | ICD-10-CM

## 2021-09-13 NOTE — Telephone Encounter (Signed)
Requested Prescriptions  Pending Prescriptions Disp Refills  . meloxicam (MOBIC) 15 MG tablet [Pharmacy Med Name: MELOXICAM 15 MG TABLET] 90 tablet 0    Sig: TAKE 1 TABLET (15 MG TOTAL) BY MOUTH DAILY AT 6 (SIX) AM.     Analgesics:  COX2 Inhibitors Failed - 09/13/2021  2:02 AM      Failed - Manual Review: Labs are only required if the patient has taken medication for more than 8 weeks.      Passed - HGB in normal range and within 360 days    Hemoglobin  Date Value Ref Range Status  05/30/2021 13.9 13.5 - 17.5 Final  12/16/2020 14.5 13.0 - 17.7 g/dL Final         Passed - Cr in normal range and within 360 days    Creatinine  Date Value Ref Range Status  05/30/2021 0.9 0.6 - 1.3 Final   Creatinine, Ser  Date Value Ref Range Status  12/16/2020 0.97 0.76 - 1.27 mg/dL Final         Passed - HCT in normal range and within 360 days    HCT  Date Value Ref Range Status  05/30/2021 42 41 - 53 Final   Hematocrit  Date Value Ref Range Status  12/16/2020 44.5 37.5 - 51.0 % Final         Passed - AST in normal range and within 360 days    AST  Date Value Ref Range Status  05/30/2021 32 14 - 40 Final         Passed - ALT in normal range and within 360 days    ALT  Date Value Ref Range Status  05/30/2021 28 10 - 40 U/L Final         Passed - eGFR is 30 or above and within 360 days    GFR calc Af Amer  Date Value Ref Range Status  09/17/2018 106 >59 mL/min/1.73 Final   GFR calc non Af Amer  Date Value Ref Range Status  09/17/2018 92 >59 mL/min/1.73 Final   eGFR  Date Value Ref Range Status  12/16/2020 96 >59 mL/min/1.73 Final         Passed - Patient is not pregnant      Passed - Valid encounter within last 12 months    Recent Outpatient Visits          2 months ago Benign essential HTN   [facility] Glean Hess, MD   9 months ago Benign essential HTN   [facility] Glean Hess, MD   1 year ago Benign essential HTN   [facility] Glean Hess, MD   1 year ago Annual physical exam   [facility] Glean Hess, MD   2 years ago [place], Laura H, MD      Future Appointments            In 3 months [facility] Melia Jesse Sans, MD [facility]

## 2021-12-20 ENCOUNTER — Ambulatory Visit (INDEPENDENT_AMBULATORY_CARE_PROVIDER_SITE_OTHER): Payer: BC Managed Care – PPO | Admitting: Internal Medicine

## 2021-12-20 ENCOUNTER — Encounter: Payer: Self-pay | Admitting: Internal Medicine

## 2021-12-20 VITALS — BP 124/78 | HR 74 | Ht 73.0 in | Wt 259.0 lb

## 2021-12-20 DIAGNOSIS — Z125 Encounter for screening for malignant neoplasm of prostate: Secondary | ICD-10-CM

## 2021-12-20 DIAGNOSIS — R7303 Prediabetes: Secondary | ICD-10-CM | POA: Diagnosis not present

## 2021-12-20 DIAGNOSIS — Z0000 Encounter for general adult medical examination without abnormal findings: Secondary | ICD-10-CM | POA: Diagnosis not present

## 2021-12-20 DIAGNOSIS — E559 Vitamin D deficiency, unspecified: Secondary | ICD-10-CM

## 2021-12-20 DIAGNOSIS — I10 Essential (primary) hypertension: Secondary | ICD-10-CM | POA: Diagnosis not present

## 2021-12-20 DIAGNOSIS — M5126 Other intervertebral disc displacement, lumbar region: Secondary | ICD-10-CM

## 2021-12-20 DIAGNOSIS — E785 Hyperlipidemia, unspecified: Secondary | ICD-10-CM

## 2021-12-20 DIAGNOSIS — L918 Other hypertrophic disorders of the skin: Secondary | ICD-10-CM

## 2021-12-20 MED ORDER — LISINOPRIL 20 MG PO TABS: 20.0000 mg | ORAL_TABLET | Freq: Every day | ORAL | 1 refills | Status: AC

## 2021-12-20 MED ORDER — TADALAFIL 5 MG PO TABS: 5.0000 mg | ORAL_TABLET | Freq: Every day | ORAL | 1 refills | Status: AC | PRN

## 2021-12-20 MED ORDER — GABAPENTIN 300 MG PO CAPS: 300.0000 mg | ORAL_CAPSULE | Freq: Two times a day (BID) | ORAL | 3 refills | Status: AC

## 2021-12-20 MED ORDER — VITAMIN D3 1.25 MG (50000 UT) PO CAPS: ORAL_CAPSULE | ORAL | 3 refills | Status: AC

## 2021-12-20 NOTE — Progress Notes (Signed)
Date:  12/20/2021   Name:  Andrew [name]   DOB:  [birth year]   MRN:  [id number]   Chief Complaint: Annual Exam Andrew [name] is a 51 y.o. male who presents today for his Complete Annual Exam. He feels well. He reports exercising. He reports he is sleeping well. He recently quit his job and is taking a 3 month break with back pay.  He will start looking for a new job near the end of the year.  Colonoscopy: 12/2020 repeat 3 yrs  Immunization History  Administered Date(s) Administered   Influenza,inj,Quad PF,6+ Mos 12/18/2018   Moderna Sars-Covid-2 Vaccination 06/04/2019, 06/27/2019, 02/27/2020   Health Maintenance Due  Topic Date Due   TETANUS/TDAP  Never done    Lab Results  Component Value Date   PSA1 0.3 12/16/2020   PSA 0.3 05/30/2021    Hypertension This is a chronic problem. The problem is controlled. Pertinent negatives include no chest pain, headaches, palpitations or shortness of breath. Past treatments include ACE inhibitors.  Diabetes He presents for his follow-up diabetic visit. Diabetes type: prediabetes. Pertinent negatives for hypoglycemia include no dizziness, headaches or nervousness/anxiousness. Pertinent negatives for diabetes include no chest pain and no fatigue. Symptoms are stable.  Skin tags - a large one under his arm and one in the upper inner thigh that rubs and gets irritated.  He is hoping that I can remove them.  Lab Results  Component Value Date   NA 141 05/30/2021   K 4.4 05/30/2021   CO2 24 (A) 05/30/2021   GLUCOSE 98 12/16/2020   BUN 14 05/30/2021   CREATININE 0.9 05/30/2021   CALCIUM 9.3 05/30/2021   EGFR 96 12/16/2020   GFRNONAA 92 09/17/2018   Lab Results  Component Value Date   CHOL 178 05/30/2021   HDL 44 05/30/2021   LDLCALC 118 05/30/2021   TRIG 85 05/30/2021   CHOLHDL 4.7 12/16/2020   Lab Results  Component Value Date   TSH 1.490 11/21/2017   Lab Results  Component Value Date   HGBA1C 6.0 05/30/2021   Lab Results   Component Value Date   WBC 5.9 05/30/2021   HGB 13.9 05/30/2021   HCT 42 05/30/2021   MCV 92 12/16/2020   PLT 229 05/30/2021   Lab Results  Component Value Date   ALT 28 05/30/2021   AST 32 05/30/2021   ALKPHOS 56 05/30/2021   BILITOT 0.5 12/16/2020   Lab Results  Component Value Date   VD25OH 72.2 10/01/2019     Review of Systems  Constitutional:  Negative for appetite change, chills, diaphoresis, fatigue and unexpected weight change.  HENT:  Negative for hearing loss, tinnitus, trouble swallowing and voice change.   Eyes:  Negative for visual disturbance.  Respiratory:  Negative for choking, shortness of breath and wheezing.   Cardiovascular:  Negative for chest pain, palpitations and leg swelling.  Gastrointestinal:  Negative for abdominal pain, blood in stool, constipation and diarrhea.  Genitourinary:  Positive for urgency. Negative for difficulty urinating, dysuria and frequency.  Musculoskeletal:  Positive for back pain. Negative for arthralgias and myalgias.  Skin:  Negative for color change and rash.  Neurological:  Negative for dizziness, syncope and headaches.  Hematological:  Negative for adenopathy.  Psychiatric/Behavioral:  Negative for dysphoric mood and sleep disturbance. The patient is not nervous/anxious.     Patient Active Problem List   Diagnosis Date Noted   Colon cancer screening    OAB (overactive bladder) 11/17/2019   Ganglion cyst  of wrist, left 10/01/2019   Pain of left hip joint 03/29/2018   Hyperlipidemia, mild 11/22/2017   Lumbar disc herniation    Neurologic gait disorder    Neuropathic pain    Benign essential HTN    Cauda equina syndrome ([place]) 06/07/2016   Prediabetes 08/19/2015   Vitamin D deficiency 07/14/2015   B12 deficiency 07/14/2015   Obesity (BMI 30-39.9) 10/05/2014   Lumbar disc disease with radiculopathy 10/05/2014    Allergies  Allergen Reactions   Hydrochlorothiazide Other (See Comments)    Severe cramping     Past Surgical History:  Procedure Laterality Date   COLONOSCOPY WITH PROPOFOL N/A 01/21/2021   Procedure: COLONOSCOPY WITH PROPOFOL;  Surgeon: Virgel Manifold, MD;  Location: [facility] ENDOSCOPY;  Service: Endoscopy;  Laterality: N/A;   LUMBAR LAMINECTOMY/DECOMPRESSION MICRODISCECTOMY Bilateral 01/21/2015   Procedure: LUMBAR THREE-FOUR, LUMBAR FOUR-FIVE LUMBAR LAMINECTOMY/DECOMPRESSION MICRODISCECTOMY ;  Surgeon: Newman Pies, MD;  Location: [place] [facility];  Service: Neurosurgery;  Laterality: Bilateral;  L34 L45 laminectomies and foraminotomies   LUMBAR LAMINECTOMY/DECOMPRESSION MICRODISCECTOMY N/A 06/07/2016   Procedure: LUMBAR LAMINECTOMY/DECOMPRESSION MICRODISCECTOMY, LUMBAR FOUR - FIVE;  Surgeon: Newman Pies, MD;  Location: [place];  Service: Neurosurgery;  Laterality: N/A;   none  [id number]   TONSILLECTOMY     as a child    WISDOM TOOTH EXTRACTION     /w bone graft - also readying for implants     Social History   Tobacco Use   Smoking status: Former    Types: Cigarettes    Quit date: 04/28/2002    Years since quitting: 19.6   Smokeless tobacco: Never  Substance Use Topics   Alcohol use: Yes    Alcohol/week: 0.0 standard drinks of alcohol    Comment: beer q day    Drug use: No     Medication list has been reviewed and updated.  Current Meds  Medication Sig   ketoconazole (NIZORAL) 2 % cream APPLY TO AFFECTED AREA TOPICALLY 2 (TWO) TIMES DAILY TO LESION ON PENIS   meloxicam (MOBIC) 15 MG tablet TAKE 1 TABLET (15 MG TOTAL) BY MOUTH DAILY AT 6 (SIX) AM. (Patient taking differently: Take 15 mg by mouth as needed.)   Multiple Vitamins-Minerals (MULTIVITAMIN ADULT PO) Take 1 tablet by mouth daily.    valACYclovir (VALTREX) 1000 MG tablet Take 1 tablet (1,000 mg total) by mouth 3 (three) times daily. (Patient taking differently: Take 1,000 mg by mouth 2 (two) times daily as needed.)   [DISCONTINUED] Cholecalciferol (VITAMIN D3) 1.25 MG (50000 UT) CAPS TAKE 1 CAPSULE BY  MOUTH ONE TIME PER WEEK   [DISCONTINUED] gabapentin (NEURONTIN) 300 MG capsule Take 1 capsule (300 mg total) by mouth 2 (two) times daily.   [DISCONTINUED] lisinopril (ZESTRIL) 20 MG tablet Take 1 tablet (20 mg total) by mouth daily.   [DISCONTINUED] tadalafil (CIALIS) 5 MG tablet Take 1 tablet (5 mg total) by mouth daily as needed for erectile dysfunction.       12/20/2021    9:26 AM 06/17/2021    9:53 AM 12/16/2020    9:31 AM 03/10/2020    2:47 PM  GAD 7 : Generalized Anxiety Score  Nervous, Anxious, on Edge 0 0 1 0  Control/stop worrying 0 0 0 0  Worry too much - different things 0 0 1 0  Trouble relaxing 0 0 1 0  Restless 0 0 0 0  Easily annoyed or irritable 0 0 1 0  Afraid - awful might happen 0 0 0 0  Total  GAD 7 Score 0 0 4 0  Anxiety Difficulty Not difficult at all  Not difficult at all        12/20/2021    9:26 AM 06/17/2021    9:52 AM 12/16/2020    9:30 AM  Depression screen PHQ 2/9  Decreased Interest 0 0 0  Down, Depressed, Hopeless 0 0 1  PHQ - 2 Score 0 0 1  Altered sleeping 0 0 0  Tired, decreased energy 0 0 0  Change in appetite 0 0 0  Feeling bad or failure about yourself  0 0 0  Trouble concentrating 0 0 0  Moving slowly or fidgety/restless 0 0 0  Suicidal thoughts 0 0 0  PHQ-9 Score 0 0 1  Difficult doing work/chores Not difficult at all Not difficult at all Not difficult at all    BP Readings from Last 3 Encounters:  12/20/21 124/78  06/17/21 136/85  01/21/21 (!) 138/94    Physical Exam Vitals and nursing note reviewed.  Constitutional:      Appearance: Normal appearance. He is well-developed.  HENT:     Head: Normocephalic.     Right Ear: Tympanic membrane, ear canal and external ear normal.     Left Ear: Tympanic membrane, ear canal and external ear normal.     Nose: Nose normal.  Eyes:     Conjunctiva/sclera: Conjunctivae normal.     Pupils: Pupils are equal, round, and reactive to light.  Neck:     Thyroid: No thyromegaly.      Vascular: No carotid bruit.  Cardiovascular:     Rate and Rhythm: Normal rate and regular rhythm.     Heart sounds: Normal heart sounds.  Pulmonary:     Effort: Pulmonary effort is normal.     Breath sounds: Normal breath sounds. No wheezing.  Chest:  Breasts:    Right: No mass.     Left: No mass.  Abdominal:     General: Bowel sounds are normal.     Palpations: Abdomen is soft.     Tenderness: There is no abdominal tenderness.  Musculoskeletal:        General: Normal range of motion.     Cervical back: Normal range of motion and neck supple.  Lymphadenopathy:     Cervical: No cervical adenopathy.  Skin:    General: Skin is warm and dry.     Comments: Elongated wide based skin tag in right axilla Raised smooth 4 mm lesion inner left thigh  Neurological:     Mental Status: He is alert and oriented to person, place, and time.     Sensory: Sensory deficit (of the left forefoot and toes) present.     Deep Tendon Reflexes: Reflexes are normal and symmetric.  Psychiatric:        Attention and Perception: Attention normal.        Mood and Affect: Mood normal.        Thought Content: Thought content normal.     Wt Readings from Last 3 Encounters:  12/20/21 259 lb (117.5 kg)  06/17/21 261 lb (118.4 kg)  01/21/21 250 lb (113.4 kg)    BP 124/78 (BP Location: Right Arm, Cuff Size: Large)   Pulse 74   Ht _0  (1.854 m)   Wt 259 lb (117.5 kg)   SpO2 97%   BMI 34.17 kg/m   Assessment and Plan: 1. Annual physical exam Exam is normal except for weight. Encourage regular exercise and appropriate dietary changes. He  declines flu vaccine today but would like to get Shingrix when he turns 52 - CBC with Differential/Platelet - Comprehensive metabolic panel - Hemoglobin A1c - Lipid panel - PSA  2. Prostate cancer screening DRE deferred - PSA  3. Benign essential HTN Clinically stable exam with well controlled BP. Tolerating medications without side effects at this time. Pt  to continue current regimen and low sodium diet; benefits of regular exercise as able discussed. - lisinopril (ZESTRIL) 20 MG tablet; Take 1 tablet (20 mg total) by mouth daily.  Dispense: 90 tablet; Refill: 1 - CBC with Differential/Platelet  4. Prediabetes Continue dietary control, exercise - Comprehensive metabolic panel - Hemoglobin A1c  5. Hyperlipidemia, mild - Lipid panel  6. Lumbar disc herniation Chronic numbness in left foot with fluctuating low back pain Continue gabapentin - gabapentin (NEURONTIN) 300 MG capsule; Take 1 capsule (300 mg total) by mouth 2 (two) times daily.  Dispense: 180 capsule; Refill: 3  7. Vitamin D deficiency Continue weekly supplement - Cholecalciferol (VITAMIN D3) 1.25 MG (50000 UT) CAPS; TAKE 1 CAPSULE BY MOUTH ONE TIME PER WEEK  Dispense: 12 capsule; Refill: 3  8. Skin tag Benign appearing lesions not amenable to sharp excision or cryo Recommend dermatology if desired, otherwise keep the groin lesion covered to avoid friction   Partially dictated using Editor, commissioning. Any errors are unintentional.  Halina Maidens, MD [facility]  12/20/2021

## 2021-12-21 DIAGNOSIS — Z125 Encounter for screening for malignant neoplasm of prostate: Secondary | ICD-10-CM | POA: Diagnosis not present

## 2021-12-21 DIAGNOSIS — R7303 Prediabetes: Secondary | ICD-10-CM | POA: Diagnosis not present

## 2021-12-21 DIAGNOSIS — I10 Essential (primary) hypertension: Secondary | ICD-10-CM | POA: Diagnosis not present

## 2021-12-21 DIAGNOSIS — E785 Hyperlipidemia, unspecified: Secondary | ICD-10-CM | POA: Diagnosis not present

## 2021-12-21 DIAGNOSIS — Z0000 Encounter for general adult medical examination without abnormal findings: Secondary | ICD-10-CM | POA: Diagnosis not present

## 2021-12-22 LAB — COMPREHENSIVE METABOLIC PANEL
ALT: 36 IU/L (ref 0–44)
AST: 37 IU/L (ref 0–40)
Albumin/Globulin Ratio: 1.8 (ref 1.2–2.2)
Albumin: 4.7 g/dL (ref 4.1–5.1)
Alkaline Phosphatase: 56 IU/L (ref 44–121)
BUN/Creatinine Ratio: 16 (ref 9–20)
BUN: 16 mg/dL (ref 6–24)
Bilirubin Total: 0.6 mg/dL (ref 0.0–1.2)
CO2: 24 mmol/L (ref 20–29)
Calcium: 9.7 mg/dL (ref 8.7–10.2)
Chloride: 99 mmol/L (ref 96–106)
Creatinine, Ser: 1.02 mg/dL (ref 0.76–1.27)
Globulin, Total: 2.6 g/dL (ref 1.5–4.5)
Glucose: 99 mg/dL (ref 70–99)
Potassium: 4.8 mmol/L (ref 3.5–5.2)
Sodium: 139 mmol/L (ref 134–144)
Total Protein: 7.3 g/dL (ref 6.0–8.5)
eGFR: 90 mL/min/{1.73_m2} (ref >59)

## 2021-12-22 LAB — CBC WITH DIFFERENTIAL/PLATELET
Basophils Absolute: 0 10*3/uL (ref 0.0–0.2)
Basos: 0 %
EOS (ABSOLUTE): 0.2 10*3/uL (ref 0.0–0.4)
Eos: 3 %
Hematocrit: 44.7 % (ref 37.5–51.0)
Hemoglobin: 14.9 g/dL (ref 13.0–17.7)
Immature Grans (Abs): 0 10*3/uL (ref 0.0–0.1)
Immature Granulocytes: 0 %
Lymphocytes Absolute: 2.4 10*3/uL (ref 0.7–3.1)
Lymphs: 34 %
MCH: 29.8 pg (ref 26.6–33.0)
MCHC: 33.3 g/dL (ref 31.5–35.7)
MCV: 89 fL (ref 79–97)
Monocytes Absolute: 0.6 10*3/uL (ref 0.1–0.9)
Monocytes: 9 %
Neutrophils Absolute: 3.9 10*3/uL (ref 1.4–7.0)
Neutrophils: 54 %
Platelets: 260 10*3/uL (ref 150–450)
RBC: 5 x10E6/uL (ref 4.14–5.80)
RDW: 13.4 % (ref 11.6–15.4)
WBC: 7.2 10*3/uL (ref 3.4–10.8)

## 2021-12-22 LAB — HEMOGLOBIN A1C
Est. average glucose Bld gHb Est-mCnc: 126 mg/dL
Hgb A1c MFr Bld: 6 % — ABNORMAL HIGH (ref 4.8–5.6)

## 2021-12-22 LAB — LIPID PANEL
Chol/HDL Ratio: 4.5 ratio (ref 0.0–5.0)
Cholesterol, Total: 198 mg/dL (ref 100–199)
HDL: 44 mg/dL (ref >39)
LDL Chol Calc (NIH): 130 mg/dL — ABNORMAL HIGH (ref 0–99)
Triglycerides: 131 mg/dL (ref 0–149)
VLDL Cholesterol Cal: 24 mg/dL (ref 5–40)

## 2021-12-22 LAB — PSA: Prostate Specific Ag, Serum: 0.3 ng/mL (ref 0.0–4.0)

## 2022-06-16 ENCOUNTER — Ambulatory Visit: Payer: BC Managed Care – PPO | Admitting: Internal Medicine

## 2022-07-18 DIAGNOSIS — H0102A Squamous blepharitis right eye, upper and lower eyelids: Secondary | ICD-10-CM | POA: Diagnosis not present

## 2022-07-27 DIAGNOSIS — I10 Essential (primary) hypertension: Secondary | ICD-10-CM | POA: Diagnosis not present

## 2022-07-27 DIAGNOSIS — Z125 Encounter for screening for malignant neoplasm of prostate: Secondary | ICD-10-CM | POA: Diagnosis not present

## 2022-07-27 DIAGNOSIS — E782 Mixed hyperlipidemia: Secondary | ICD-10-CM | POA: Diagnosis not present

## 2022-07-27 DIAGNOSIS — M5136 Other intervertebral disc degeneration, lumbar region: Secondary | ICD-10-CM | POA: Diagnosis not present

## 2022-07-27 DIAGNOSIS — R7303 Prediabetes: Secondary | ICD-10-CM | POA: Diagnosis not present

## 2022-07-27 DIAGNOSIS — E559 Vitamin D deficiency, unspecified: Secondary | ICD-10-CM | POA: Diagnosis not present

## 2022-08-04 ENCOUNTER — Ambulatory Visit: Payer: BC Managed Care – PPO | Admitting: Internal Medicine

## 2022-09-03 ENCOUNTER — Other Ambulatory Visit: Payer: Self-pay | Admitting: Internal Medicine

## 2022-09-03 DIAGNOSIS — I10 Essential (primary) hypertension: Secondary | ICD-10-CM

## 2022-09-04 NOTE — Telephone Encounter (Signed)
Requested Prescriptions  Pending Prescriptions Disp Refills   lisinopril (ZESTRIL) 20 MG tablet [Pharmacy Med Name: LISINOPRIL 20 MG TABLET] 30 tablet 5    Sig: TAKE 1 TABLET BY MOUTH EVERY DAY     Cardiovascular:  ACE Inhibitors Failed - 09/03/2022  8:41 AM      Failed - Cr in normal range and within 180 days    Creatinine, Ser  Date Value Ref Range Status  12/21/2021 1.02 0.76 - 1.27 mg/dL Final         Failed - K in normal range and within 180 days    Potassium  Date Value Ref Range Status  12/21/2021 4.8 3.5 - 5.2 mmol/L Final         Failed - Valid encounter within last 6 months    Recent Outpatient Visits           8 months ago Annual physical exam   [place] [facility] at [facility], Nyoka Cowden, MD   1 year ago Benign essential HTN   [place] [facility] at [facility], Nyoka Cowden, MD   1 year ago Benign essential HTN   [place] [facility] at [facility], Nyoka Cowden, MD   2 years ago Benign essential HTN   [place] [facility] at [facility], Nyoka Cowden, MD   2 years ago Annual physical exam   [facility] at [facility], Nyoka Cowden, MD              Passed - Patient is not pregnant      Passed - Last BP in normal range    BP Readings from Last 1 Encounters:  12/20/21 124/78

## 2022-12-25 ENCOUNTER — Encounter: Payer: BC Managed Care – PPO | Admitting: Internal Medicine

## 2023-01-19 ENCOUNTER — Other Ambulatory Visit: Payer: Self-pay | Admitting: Internal Medicine

## 2023-01-19 DIAGNOSIS — M5126 Other intervertebral disc displacement, lumbar region: Secondary | ICD-10-CM

## 2023-01-22 NOTE — Telephone Encounter (Signed)
Requested medication (s) are due for refill today: yes  Requested medication (s) are on the active medication list: yes  Last refill:  12/20/21  Future visit scheduled: no  Notes to clinic:  Unable to refill per protocol due to failed labs, no updated results.      Requested Prescriptions  Pending Prescriptions Disp Refills   gabapentin (NEURONTIN) 300 MG capsule [Pharmacy Med Name: GABAPENTIN 300 MG CAPSULE] 60 capsule 11    Sig: TAKE 1 CAPSULE BY MOUTH TWICE A DAY     Neurology: Anticonvulsants - gabapentin Failed - 01/19/2023  5:31 PM      Failed - Cr in normal range and within 360 days    Creatinine, Ser  Date Value Ref Range Status  12/21/2021 1.02 0.76 - 1.27 mg/dL Final         Failed - Completed PHQ-2 or PHQ-9 in the last 360 days      Failed - Valid encounter within last 12 months    Recent Outpatient Visits           1 year ago Annual physical exam   [place] [facility] at [facility], Nyoka Cowden, MD   1 year ago Benign essential HTN   [place] [facility] at [facility], Nyoka Cowden, MD   2 years ago Benign essential HTN   [place] [facility] at [facility], Nyoka Cowden, MD   2 years ago Benign essential HTN   [place] [facility] at [facility], Nyoka Cowden, MD   3 years ago Annual physical exam   [place] [facility] at [facility], Nyoka Cowden, MD
